# Patient Record
Sex: Male | Born: 1955
Health system: Southern US, Community
[De-identification: ages and names within clinical notes are randomized; demographics above are authoritative.]

## PROBLEM LIST (undated history)

## (undated) DIAGNOSIS — C3410 Malignant neoplasm of upper lobe, unspecified bronchus or lung: Secondary | ICD-10-CM

## (undated) DIAGNOSIS — C801 Malignant (primary) neoplasm, unspecified: Secondary | ICD-10-CM

## (undated) DIAGNOSIS — C3490 Malignant neoplasm of unspecified part of unspecified bronchus or lung: Secondary | ICD-10-CM

## (undated) HISTORY — DX: Malignant (primary) neoplasm, unspecified: C80.1

## (undated) HISTORY — DX: Malignant neoplasm of upper lobe, unspecified bronchus or lung: C34.10

---

## 2003-09-28 ENCOUNTER — Other Ambulatory Visit: Payer: Self-pay

## 2007-02-20 ENCOUNTER — Other Ambulatory Visit: Payer: Self-pay

## 2007-02-20 ENCOUNTER — Emergency Department: Payer: Self-pay | Admitting: Internal Medicine

## 2007-02-21 ENCOUNTER — Emergency Department: Payer: Self-pay | Admitting: Emergency Medicine

## 2009-03-18 DIAGNOSIS — C801 Malignant (primary) neoplasm, unspecified: Secondary | ICD-10-CM

## 2009-03-18 HISTORY — DX: Malignant (primary) neoplasm, unspecified: C80.1

## 2009-04-27 ENCOUNTER — Emergency Department: Payer: Self-pay | Admitting: Emergency Medicine

## 2009-05-11 ENCOUNTER — Inpatient Hospital Stay: Payer: Self-pay | Admitting: Specialist

## 2009-07-04 ENCOUNTER — Inpatient Hospital Stay: Payer: Self-pay | Admitting: Internal Medicine

## 2010-01-29 ENCOUNTER — Ambulatory Visit: Payer: Self-pay | Admitting: Family

## 2010-02-05 ENCOUNTER — Ambulatory Visit: Payer: Self-pay | Admitting: Internal Medicine

## 2010-02-15 DIAGNOSIS — C119 Malignant neoplasm of nasopharynx, unspecified: Secondary | ICD-10-CM | POA: Insufficient documentation

## 2010-02-21 ENCOUNTER — Ambulatory Visit: Payer: Self-pay | Admitting: Unknown Physician Specialty

## 2010-02-26 ENCOUNTER — Ambulatory Visit: Payer: Self-pay | Admitting: Oncology

## 2010-03-02 ENCOUNTER — Ambulatory Visit: Payer: Self-pay | Admitting: Surgery

## 2010-03-18 ENCOUNTER — Ambulatory Visit: Payer: Self-pay | Admitting: Oncology

## 2010-04-18 ENCOUNTER — Ambulatory Visit: Payer: Self-pay | Admitting: Oncology

## 2010-05-17 ENCOUNTER — Ambulatory Visit: Payer: Self-pay | Admitting: Oncology

## 2010-06-17 ENCOUNTER — Ambulatory Visit: Payer: Self-pay | Admitting: Oncology

## 2010-07-17 ENCOUNTER — Ambulatory Visit: Payer: Self-pay | Admitting: Oncology

## 2010-08-17 ENCOUNTER — Ambulatory Visit: Payer: Self-pay | Admitting: Oncology

## 2010-09-16 ENCOUNTER — Ambulatory Visit: Payer: Self-pay | Admitting: Oncology

## 2010-10-18 ENCOUNTER — Ambulatory Visit: Payer: Self-pay | Admitting: Oncology

## 2010-10-30 ENCOUNTER — Ambulatory Visit: Payer: Self-pay | Admitting: Oncology

## 2010-11-17 ENCOUNTER — Ambulatory Visit: Payer: Self-pay | Admitting: Oncology

## 2010-12-17 ENCOUNTER — Ambulatory Visit: Payer: Self-pay | Admitting: Oncology

## 2011-01-31 ENCOUNTER — Ambulatory Visit: Payer: Self-pay | Admitting: Oncology

## 2011-02-16 ENCOUNTER — Ambulatory Visit: Payer: Self-pay | Admitting: Oncology

## 2011-05-03 ENCOUNTER — Ambulatory Visit: Payer: Self-pay | Admitting: Oncology

## 2011-05-17 ENCOUNTER — Ambulatory Visit: Payer: Self-pay | Admitting: Oncology

## 2011-06-11 ENCOUNTER — Ambulatory Visit: Payer: Self-pay | Admitting: Oncology

## 2011-06-13 LAB — CBC CANCER CENTER
Basophil #: 0 x10 3/mm (ref 0.0–0.1)
Eosinophil #: 0.1 x10 3/mm (ref 0.0–0.7)
Eosinophil %: 1.3 %
Lymphocyte #: 1.2 x10 3/mm (ref 1.0–3.6)
Lymphocyte %: 18.3 %
MCHC: 34 g/dL (ref 32.0–36.0)
Monocyte #: 0.5 x10 3/mm (ref 0.0–0.7)
Monocyte %: 7.8 %
Neutrophil #: 4.6 x10 3/mm (ref 1.4–6.5)
Neutrophil %: 71.8 %
Platelet: 247 x10 3/mm (ref 150–440)
RBC: 5.05 10*6/uL (ref 4.40–5.90)
RDW: 14.4 % (ref 11.5–14.5)
WBC: 6.4 x10 3/mm (ref 3.8–10.6)

## 2011-06-13 LAB — BASIC METABOLIC PANEL
Anion Gap: 9 (ref 7–16)
Co2: 28 mmol/L (ref 21–32)
EGFR (Non-African Amer.): >60

## 2011-06-17 ENCOUNTER — Ambulatory Visit: Payer: Self-pay | Admitting: Oncology

## 2011-09-16 ENCOUNTER — Ambulatory Visit: Payer: Self-pay | Admitting: Oncology

## 2011-09-16 LAB — CBC CANCER CENTER
Basophil #: 0 x10 3/mm (ref 0.0–0.1)
Eosinophil #: 0.1 x10 3/mm (ref 0.0–0.7)
Eosinophil %: 1.4 %
HCT: 47.1 % (ref 40.0–52.0)
HGB: 16 g/dL (ref 13.0–18.0)
Lymphocyte #: 1.3 x10 3/mm (ref 1.0–3.6)
MCH: 31.4 pg (ref 26.0–34.0)
MCV: 92 fL (ref 80–100)
Monocyte %: 9.6 %
Neutrophil #: 5.5 x10 3/mm (ref 1.4–6.5)
RBC: 5.1 10*6/uL (ref 4.40–5.90)
RDW: 13.8 % (ref 11.5–14.5)
WBC: 7.6 x10 3/mm (ref 3.8–10.6)

## 2011-09-16 LAB — TSH: Thyroid Stimulating Horm: 1.95 u[IU]/mL

## 2011-09-16 LAB — COMPREHENSIVE METABOLIC PANEL
Albumin: 4.1 g/dL (ref 3.4–5.0)
Alkaline Phosphatase: 82 U/L (ref 50–136)
Anion Gap: 6 — ABNORMAL LOW (ref 7–16)
BUN: 11 mg/dL (ref 7–18)
Bilirubin,Total: 0.4 mg/dL (ref 0.2–1.0)
Calcium, Total: 8.9 mg/dL (ref 8.5–10.1)
Chloride: 104 mmol/L (ref 98–107)
Creatinine: 0.88 mg/dL (ref 0.60–1.30)
Glucose: 94 mg/dL (ref 65–99)
Osmolality: 280 (ref 275–301)
Sodium: 141 mmol/L (ref 136–145)
Total Protein: 7.3 g/dL (ref 6.4–8.2)

## 2011-10-17 ENCOUNTER — Ambulatory Visit: Payer: Self-pay | Admitting: Oncology

## 2011-12-04 ENCOUNTER — Ambulatory Visit: Payer: Self-pay | Admitting: Oncology

## 2011-12-04 DIAGNOSIS — Z452 Encounter for adjustment and management of vascular access device: Secondary | ICD-10-CM | POA: Diagnosis not present

## 2011-12-04 DIAGNOSIS — C119 Malignant neoplasm of nasopharynx, unspecified: Secondary | ICD-10-CM | POA: Diagnosis not present

## 2011-12-04 DIAGNOSIS — C770 Secondary and unspecified malignant neoplasm of lymph nodes of head, face and neck: Secondary | ICD-10-CM | POA: Diagnosis not present

## 2011-12-17 ENCOUNTER — Ambulatory Visit: Payer: Self-pay | Admitting: Oncology

## 2012-01-22 ENCOUNTER — Ambulatory Visit: Payer: Self-pay | Admitting: Oncology

## 2012-01-22 DIAGNOSIS — C770 Secondary and unspecified malignant neoplasm of lymph nodes of head, face and neck: Secondary | ICD-10-CM | POA: Diagnosis not present

## 2012-01-22 DIAGNOSIS — Z452 Encounter for adjustment and management of vascular access device: Secondary | ICD-10-CM | POA: Diagnosis not present

## 2012-01-22 DIAGNOSIS — C119 Malignant neoplasm of nasopharynx, unspecified: Secondary | ICD-10-CM | POA: Diagnosis not present

## 2012-02-16 ENCOUNTER — Ambulatory Visit: Payer: Self-pay | Admitting: Oncology

## 2012-02-20 DIAGNOSIS — C119 Malignant neoplasm of nasopharynx, unspecified: Secondary | ICD-10-CM | POA: Diagnosis not present

## 2012-02-20 DIAGNOSIS — T65291A Toxic effect of other tobacco and nicotine, accidental (unintentional), initial encounter: Secondary | ICD-10-CM | POA: Diagnosis not present

## 2012-03-23 ENCOUNTER — Ambulatory Visit: Payer: Self-pay | Admitting: Oncology

## 2012-03-23 DIAGNOSIS — Z923 Personal history of irradiation: Secondary | ICD-10-CM | POA: Diagnosis not present

## 2012-03-23 DIAGNOSIS — Z9221 Personal history of antineoplastic chemotherapy: Secondary | ICD-10-CM | POA: Diagnosis not present

## 2012-03-23 DIAGNOSIS — T82898A Other specified complication of vascular prosthetic devices, implants and grafts, initial encounter: Secondary | ICD-10-CM | POA: Diagnosis not present

## 2012-03-23 DIAGNOSIS — Z85819 Personal history of malignant neoplasm of unspecified site of lip, oral cavity, and pharynx: Secondary | ICD-10-CM | POA: Diagnosis not present

## 2012-03-23 LAB — COMPREHENSIVE METABOLIC PANEL
Albumin: 4.3 g/dL (ref 3.4–5.0)
Alkaline Phosphatase: 83 U/L (ref 50–136)
Anion Gap: 9 (ref 7–16)
BUN: 22 mg/dL — ABNORMAL HIGH (ref 7–18)
Bilirubin,Total: 0.4 mg/dL (ref 0.2–1.0)
Co2: 29 mmol/L (ref 21–32)
Creatinine: 1.03 mg/dL (ref 0.60–1.30)
Glucose: 98 mg/dL (ref 65–99)
Potassium: 4.8 mmol/L (ref 3.5–5.1)
SGOT(AST): 17 U/L (ref 15–37)
SGPT (ALT): 30 U/L (ref 12–78)
Sodium: 142 mmol/L (ref 136–145)

## 2012-03-23 LAB — CBC CANCER CENTER
Basophil #: 0 x10 3/mm (ref 0.0–0.1)
Basophil %: 0.6 %
Eosinophil #: 0.1 x10 3/mm (ref 0.0–0.7)
Eosinophil %: 2.1 %
HGB: 15.9 g/dL (ref 13.0–18.0)
Lymphocyte %: 19.3 %
MCH: 31 pg (ref 26.0–34.0)
MCHC: 34.3 g/dL (ref 32.0–36.0)
Monocyte #: 0.6 x10 3/mm (ref 0.2–1.0)
Monocyte %: 8.5 %
Neutrophil %: 69.5 %
Platelet: 239 x10 3/mm (ref 150–440)
RDW: 14.3 % (ref 11.5–14.5)
WBC: 6.9 x10 3/mm (ref 3.8–10.6)

## 2012-04-18 ENCOUNTER — Ambulatory Visit: Payer: Self-pay | Admitting: Oncology

## 2012-05-16 ENCOUNTER — Ambulatory Visit: Payer: Self-pay | Admitting: Oncology

## 2012-05-16 DIAGNOSIS — Z85819 Personal history of malignant neoplasm of unspecified site of lip, oral cavity, and pharynx: Secondary | ICD-10-CM | POA: Diagnosis not present

## 2012-05-16 DIAGNOSIS — Z79899 Other long term (current) drug therapy: Secondary | ICD-10-CM | POA: Diagnosis not present

## 2012-05-16 DIAGNOSIS — F411 Generalized anxiety disorder: Secondary | ICD-10-CM | POA: Diagnosis not present

## 2012-05-16 DIAGNOSIS — Z923 Personal history of irradiation: Secondary | ICD-10-CM | POA: Diagnosis not present

## 2012-05-16 DIAGNOSIS — G4700 Insomnia, unspecified: Secondary | ICD-10-CM | POA: Diagnosis not present

## 2012-05-16 DIAGNOSIS — IMO0002 Reserved for concepts with insufficient information to code with codable children: Secondary | ICD-10-CM | POA: Diagnosis not present

## 2012-05-16 DIAGNOSIS — J449 Chronic obstructive pulmonary disease, unspecified: Secondary | ICD-10-CM | POA: Diagnosis not present

## 2012-05-16 DIAGNOSIS — F17200 Nicotine dependence, unspecified, uncomplicated: Secondary | ICD-10-CM | POA: Diagnosis not present

## 2012-05-16 DIAGNOSIS — Z9221 Personal history of antineoplastic chemotherapy: Secondary | ICD-10-CM | POA: Diagnosis not present

## 2012-06-15 DIAGNOSIS — G4700 Insomnia, unspecified: Secondary | ICD-10-CM | POA: Diagnosis not present

## 2012-06-15 DIAGNOSIS — Z923 Personal history of irradiation: Secondary | ICD-10-CM | POA: Diagnosis not present

## 2012-06-15 DIAGNOSIS — Z9221 Personal history of antineoplastic chemotherapy: Secondary | ICD-10-CM | POA: Diagnosis not present

## 2012-06-15 DIAGNOSIS — Z85819 Personal history of malignant neoplasm of unspecified site of lip, oral cavity, and pharynx: Secondary | ICD-10-CM | POA: Diagnosis not present

## 2012-06-15 DIAGNOSIS — C760 Malignant neoplasm of head, face and neck: Secondary | ICD-10-CM | POA: Diagnosis not present

## 2012-06-15 LAB — CBC CANCER CENTER
Basophil #: 0.1 x10 3/mm (ref 0.0–0.1)
Basophil %: 0.8 %
Eosinophil #: 0.1 x10 3/mm (ref 0.0–0.7)
HCT: 47.7 % (ref 40.0–52.0)
HGB: 16.5 g/dL (ref 13.0–18.0)
Lymphocyte #: 1.3 x10 3/mm (ref 1.0–3.6)
Lymphocyte %: 15.4 %
MCH: 30.8 pg (ref 26.0–34.0)
MCV: 89 fL (ref 80–100)
Monocyte #: 0.6 x10 3/mm (ref 0.2–1.0)
Monocyte %: 6.9 %
Neutrophil #: 6.3 x10 3/mm (ref 1.4–6.5)
RBC: 5.35 10*6/uL (ref 4.40–5.90)
RDW: 14.6 % — ABNORMAL HIGH (ref 11.5–14.5)
WBC: 8.3 x10 3/mm (ref 3.8–10.6)

## 2012-06-15 LAB — COMPREHENSIVE METABOLIC PANEL
Albumin: 4.1 g/dL (ref 3.4–5.0)
Alkaline Phosphatase: 84 U/L (ref 50–136)
BUN: 13 mg/dL (ref 7–18)
Bilirubin,Total: 0.4 mg/dL (ref 0.2–1.0)
Chloride: 103 mmol/L (ref 98–107)
Creatinine: 0.94 mg/dL (ref 0.60–1.30)
EGFR (African American): >60
EGFR (Non-African Amer.): >60
Glucose: 102 mg/dL — ABNORMAL HIGH (ref 65–99)
Osmolality: 283 (ref 275–301)
Potassium: 4.6 mmol/L (ref 3.5–5.1)
SGOT(AST): 13 U/L — ABNORMAL LOW (ref 15–37)
SGPT (ALT): 22 U/L (ref 12–78)
Sodium: 142 mmol/L (ref 136–145)

## 2012-06-15 LAB — TSH: Thyroid Stimulating Horm: 2.07 u[IU]/mL

## 2012-06-16 ENCOUNTER — Ambulatory Visit: Payer: Self-pay | Admitting: Oncology

## 2012-08-20 DIAGNOSIS — T65291A Toxic effect of other tobacco and nicotine, accidental (unintentional), initial encounter: Secondary | ICD-10-CM | POA: Diagnosis not present

## 2012-08-20 DIAGNOSIS — D0000 Carcinoma in situ of oral cavity, unspecified site: Secondary | ICD-10-CM | POA: Diagnosis not present

## 2012-08-20 DIAGNOSIS — C119 Malignant neoplasm of nasopharynx, unspecified: Secondary | ICD-10-CM | POA: Diagnosis not present

## 2012-08-20 DIAGNOSIS — J438 Other emphysema: Secondary | ICD-10-CM | POA: Diagnosis not present

## 2012-10-27 DIAGNOSIS — R279 Unspecified lack of coordination: Secondary | ICD-10-CM | POA: Diagnosis not present

## 2012-10-27 DIAGNOSIS — C760 Malignant neoplasm of head, face and neck: Secondary | ICD-10-CM | POA: Diagnosis not present

## 2012-10-27 DIAGNOSIS — R5381 Other malaise: Secondary | ICD-10-CM | POA: Diagnosis not present

## 2012-10-27 DIAGNOSIS — D0000 Carcinoma in situ of oral cavity, unspecified site: Secondary | ICD-10-CM | POA: Diagnosis not present

## 2012-12-02 ENCOUNTER — Ambulatory Visit: Payer: Self-pay | Admitting: Oncology

## 2012-12-02 DIAGNOSIS — Z85819 Personal history of malignant neoplasm of unspecified site of lip, oral cavity, and pharynx: Secondary | ICD-10-CM | POA: Diagnosis not present

## 2012-12-02 DIAGNOSIS — Z79899 Other long term (current) drug therapy: Secondary | ICD-10-CM | POA: Diagnosis not present

## 2012-12-02 DIAGNOSIS — Z923 Personal history of irradiation: Secondary | ICD-10-CM | POA: Diagnosis not present

## 2012-12-02 DIAGNOSIS — F329 Major depressive disorder, single episode, unspecified: Secondary | ICD-10-CM | POA: Diagnosis not present

## 2012-12-02 DIAGNOSIS — Z9221 Personal history of antineoplastic chemotherapy: Secondary | ICD-10-CM | POA: Diagnosis not present

## 2012-12-02 DIAGNOSIS — Z87898 Personal history of other specified conditions: Secondary | ICD-10-CM | POA: Diagnosis not present

## 2012-12-03 DIAGNOSIS — Z79899 Other long term (current) drug therapy: Secondary | ICD-10-CM | POA: Diagnosis not present

## 2012-12-03 DIAGNOSIS — Z923 Personal history of irradiation: Secondary | ICD-10-CM | POA: Diagnosis not present

## 2012-12-03 DIAGNOSIS — R05 Cough: Secondary | ICD-10-CM | POA: Diagnosis not present

## 2012-12-03 DIAGNOSIS — Z85819 Personal history of malignant neoplasm of unspecified site of lip, oral cavity, and pharynx: Secondary | ICD-10-CM | POA: Diagnosis not present

## 2012-12-03 DIAGNOSIS — Z09 Encounter for follow-up examination after completed treatment for conditions other than malignant neoplasm: Secondary | ICD-10-CM | POA: Diagnosis not present

## 2012-12-03 DIAGNOSIS — F329 Major depressive disorder, single episode, unspecified: Secondary | ICD-10-CM | POA: Diagnosis not present

## 2012-12-03 DIAGNOSIS — Z9221 Personal history of antineoplastic chemotherapy: Secondary | ICD-10-CM | POA: Diagnosis not present

## 2012-12-03 DIAGNOSIS — R0602 Shortness of breath: Secondary | ICD-10-CM | POA: Diagnosis not present

## 2012-12-03 DIAGNOSIS — Z87898 Personal history of other specified conditions: Secondary | ICD-10-CM | POA: Diagnosis not present

## 2012-12-03 LAB — COMPREHENSIVE METABOLIC PANEL
Anion Gap: 1 — ABNORMAL LOW (ref 7–16)
BUN: 20 mg/dL — ABNORMAL HIGH (ref 7–18)
Bilirubin,Total: 0.3 mg/dL (ref 0.2–1.0)
Chloride: 108 mmol/L — ABNORMAL HIGH (ref 98–107)
Co2: 31 mmol/L (ref 21–32)
EGFR (African American): >60
EGFR (Non-African Amer.): >60
Glucose: 74 mg/dL (ref 65–99)
Osmolality: 281 (ref 275–301)
Potassium: 4.6 mmol/L (ref 3.5–5.1)
SGOT(AST): 13 U/L — ABNORMAL LOW (ref 15–37)
SGPT (ALT): 30 U/L (ref 12–78)

## 2012-12-03 LAB — T4, FREE: Free Thyroxine: 0.98 ng/dL (ref 0.76–1.46)

## 2012-12-03 LAB — TSH: Thyroid Stimulating Horm: 1.85 u[IU]/mL

## 2012-12-16 ENCOUNTER — Ambulatory Visit: Payer: Self-pay | Admitting: Oncology

## 2013-01-06 DIAGNOSIS — R279 Unspecified lack of coordination: Secondary | ICD-10-CM | POA: Diagnosis not present

## 2013-01-06 DIAGNOSIS — Z1339 Encounter for screening examination for other mental health and behavioral disorders: Secondary | ICD-10-CM | POA: Diagnosis not present

## 2013-01-06 DIAGNOSIS — R5381 Other malaise: Secondary | ICD-10-CM | POA: Diagnosis not present

## 2013-01-06 DIAGNOSIS — J209 Acute bronchitis, unspecified: Secondary | ICD-10-CM | POA: Diagnosis not present

## 2013-01-19 DIAGNOSIS — J438 Other emphysema: Secondary | ICD-10-CM | POA: Diagnosis not present

## 2013-01-19 DIAGNOSIS — C760 Malignant neoplasm of head, face and neck: Secondary | ICD-10-CM | POA: Diagnosis not present

## 2013-01-19 DIAGNOSIS — D0000 Carcinoma in situ of oral cavity, unspecified site: Secondary | ICD-10-CM | POA: Diagnosis not present

## 2013-03-18 DIAGNOSIS — C3490 Malignant neoplasm of unspecified part of unspecified bronchus or lung: Secondary | ICD-10-CM

## 2013-03-18 HISTORY — DX: Malignant neoplasm of unspecified part of unspecified bronchus or lung: C34.90

## 2013-06-01 ENCOUNTER — Ambulatory Visit: Payer: Self-pay | Admitting: Oncology

## 2013-06-01 DIAGNOSIS — F17200 Nicotine dependence, unspecified, uncomplicated: Secondary | ICD-10-CM | POA: Diagnosis not present

## 2013-06-01 DIAGNOSIS — K7689 Other specified diseases of liver: Secondary | ICD-10-CM | POA: Diagnosis not present

## 2013-06-01 DIAGNOSIS — F411 Generalized anxiety disorder: Secondary | ICD-10-CM | POA: Diagnosis not present

## 2013-06-01 DIAGNOSIS — Z79899 Other long term (current) drug therapy: Secondary | ICD-10-CM | POA: Diagnosis not present

## 2013-06-01 DIAGNOSIS — D3500 Benign neoplasm of unspecified adrenal gland: Secondary | ICD-10-CM | POA: Diagnosis not present

## 2013-06-01 DIAGNOSIS — J440 Chronic obstructive pulmonary disease with acute lower respiratory infection: Secondary | ICD-10-CM | POA: Diagnosis not present

## 2013-06-01 DIAGNOSIS — Z85819 Personal history of malignant neoplasm of unspecified site of lip, oral cavity, and pharynx: Secondary | ICD-10-CM | POA: Diagnosis not present

## 2013-06-01 DIAGNOSIS — Z923 Personal history of irradiation: Secondary | ICD-10-CM | POA: Diagnosis not present

## 2013-06-01 DIAGNOSIS — F329 Major depressive disorder, single episode, unspecified: Secondary | ICD-10-CM | POA: Diagnosis not present

## 2013-06-01 DIAGNOSIS — F3289 Other specified depressive episodes: Secondary | ICD-10-CM | POA: Diagnosis not present

## 2013-06-01 DIAGNOSIS — Z9221 Personal history of antineoplastic chemotherapy: Secondary | ICD-10-CM | POA: Diagnosis not present

## 2013-06-01 DIAGNOSIS — R911 Solitary pulmonary nodule: Secondary | ICD-10-CM | POA: Diagnosis not present

## 2013-06-01 DIAGNOSIS — R599 Enlarged lymph nodes, unspecified: Secondary | ICD-10-CM | POA: Diagnosis not present

## 2013-06-01 DIAGNOSIS — Z09 Encounter for follow-up examination after completed treatment for conditions other than malignant neoplasm: Secondary | ICD-10-CM | POA: Diagnosis not present

## 2013-06-01 DIAGNOSIS — D72829 Elevated white blood cell count, unspecified: Secondary | ICD-10-CM | POA: Diagnosis not present

## 2013-06-01 DIAGNOSIS — R5383 Other fatigue: Secondary | ICD-10-CM | POA: Diagnosis not present

## 2013-06-01 DIAGNOSIS — H9190 Unspecified hearing loss, unspecified ear: Secondary | ICD-10-CM | POA: Diagnosis not present

## 2013-06-01 DIAGNOSIS — R5381 Other malaise: Secondary | ICD-10-CM | POA: Diagnosis not present

## 2013-06-01 DIAGNOSIS — J449 Chronic obstructive pulmonary disease, unspecified: Secondary | ICD-10-CM | POA: Diagnosis not present

## 2013-06-01 DIAGNOSIS — IMO0002 Reserved for concepts with insufficient information to code with codable children: Secondary | ICD-10-CM | POA: Diagnosis not present

## 2013-06-02 DIAGNOSIS — Z85819 Personal history of malignant neoplasm of unspecified site of lip, oral cavity, and pharynx: Secondary | ICD-10-CM | POA: Diagnosis not present

## 2013-06-02 DIAGNOSIS — Z923 Personal history of irradiation: Secondary | ICD-10-CM | POA: Diagnosis not present

## 2013-06-02 DIAGNOSIS — J440 Chronic obstructive pulmonary disease with acute lower respiratory infection: Secondary | ICD-10-CM | POA: Diagnosis not present

## 2013-06-02 DIAGNOSIS — J40 Bronchitis, not specified as acute or chronic: Secondary | ICD-10-CM | POA: Diagnosis not present

## 2013-06-02 DIAGNOSIS — Z09 Encounter for follow-up examination after completed treatment for conditions other than malignant neoplasm: Secondary | ICD-10-CM | POA: Diagnosis not present

## 2013-06-02 DIAGNOSIS — Z9221 Personal history of antineoplastic chemotherapy: Secondary | ICD-10-CM | POA: Diagnosis not present

## 2013-06-02 LAB — CBC CANCER CENTER
BASOS ABS: 0.1 x10 3/mm (ref 0.0–0.1)
Basophil %: 0.4 %
Eosinophil #: 0.2 x10 3/mm (ref 0.0–0.7)
Eosinophil %: 1.2 %
HCT: 45.6 % (ref 40.0–52.0)
HGB: 14.9 g/dL (ref 13.0–18.0)
Lymphocyte #: 0.8 x10 3/mm — ABNORMAL LOW (ref 1.0–3.6)
Lymphocyte %: 5.5 %
MCH: 29.5 pg (ref 26.0–34.0)
MCHC: 32.8 g/dL (ref 32.0–36.0)
MCV: 90 fL (ref 80–100)
MONOS PCT: 8.3 %
Monocyte #: 1.3 x10 3/mm — ABNORMAL HIGH (ref 0.2–1.0)
NEUTROS PCT: 84.6 %
Neutrophil #: 13 x10 3/mm — ABNORMAL HIGH (ref 1.4–6.5)
Platelet: 407 x10 3/mm (ref 150–440)
RBC: 5.07 10*6/uL (ref 4.40–5.90)
RDW: 14.2 % (ref 11.5–14.5)
WBC: 15.4 x10 3/mm — ABNORMAL HIGH (ref 3.8–10.6)

## 2013-06-02 LAB — COMPREHENSIVE METABOLIC PANEL
ALK PHOS: 159 U/L — AB
ALT: 51 U/L (ref 12–78)
Albumin: 3.3 g/dL — ABNORMAL LOW (ref 3.4–5.0)
Anion Gap: 7 (ref 7–16)
BUN: 25 mg/dL — ABNORMAL HIGH (ref 7–18)
Bilirubin,Total: 0.6 mg/dL (ref 0.2–1.0)
Calcium, Total: 9.3 mg/dL (ref 8.5–10.1)
Chloride: 98 mmol/L (ref 98–107)
Co2: 33 mmol/L — ABNORMAL HIGH (ref 21–32)
Creatinine: 1.02 mg/dL (ref 0.60–1.30)
Glucose: 158 mg/dL — ABNORMAL HIGH (ref 65–99)
OSMOLALITY: 283 (ref 275–301)
Potassium: 4 mmol/L (ref 3.5–5.1)
SGOT(AST): 23 U/L (ref 15–37)
Sodium: 138 mmol/L (ref 136–145)
Total Protein: 7.8 g/dL (ref 6.4–8.2)

## 2013-06-08 DIAGNOSIS — D3500 Benign neoplasm of unspecified adrenal gland: Secondary | ICD-10-CM | POA: Diagnosis not present

## 2013-06-08 DIAGNOSIS — R599 Enlarged lymph nodes, unspecified: Secondary | ICD-10-CM | POA: Diagnosis not present

## 2013-06-08 DIAGNOSIS — R918 Other nonspecific abnormal finding of lung field: Secondary | ICD-10-CM | POA: Diagnosis not present

## 2013-06-10 DIAGNOSIS — Z9221 Personal history of antineoplastic chemotherapy: Secondary | ICD-10-CM | POA: Diagnosis not present

## 2013-06-10 DIAGNOSIS — Z923 Personal history of irradiation: Secondary | ICD-10-CM | POA: Diagnosis not present

## 2013-06-10 DIAGNOSIS — R599 Enlarged lymph nodes, unspecified: Secondary | ICD-10-CM | POA: Diagnosis not present

## 2013-06-10 DIAGNOSIS — Z85819 Personal history of malignant neoplasm of unspecified site of lip, oral cavity, and pharynx: Secondary | ICD-10-CM | POA: Diagnosis not present

## 2013-06-10 LAB — COMPREHENSIVE METABOLIC PANEL
ALBUMIN: 3.4 g/dL (ref 3.4–5.0)
ALK PHOS: 89 U/L
ALT: 40 U/L (ref 12–78)
Anion Gap: 7 (ref 7–16)
BUN: 24 mg/dL — AB (ref 7–18)
Bilirubin,Total: 0.3 mg/dL (ref 0.2–1.0)
CALCIUM: 9.3 mg/dL (ref 8.5–10.1)
Chloride: 101 mmol/L (ref 98–107)
Co2: 33 mmol/L — ABNORMAL HIGH (ref 21–32)
Creatinine: 1.14 mg/dL (ref 0.60–1.30)
EGFR (Non-African Amer.): >60
GLUCOSE: 102 mg/dL — AB (ref 65–99)
OSMOLALITY: 285 (ref 275–301)
Potassium: 4.9 mmol/L (ref 3.5–5.1)
SGOT(AST): 15 U/L (ref 15–37)
Sodium: 141 mmol/L (ref 136–145)
Total Protein: 7.1 g/dL (ref 6.4–8.2)

## 2013-06-10 LAB — PROTIME-INR
INR: 1
Prothrombin Time: 13.1 secs (ref 11.5–14.7)

## 2013-06-10 LAB — CBC CANCER CENTER
BASOS PCT: 0.5 %
Basophil #: 0.1 x10 3/mm (ref 0.0–0.1)
Eosinophil #: 0.2 x10 3/mm (ref 0.0–0.7)
Eosinophil %: 1.6 %
HCT: 49.9 % (ref 40.0–52.0)
HGB: 16.2 g/dL (ref 13.0–18.0)
LYMPHS PCT: 14.4 %
Lymphocyte #: 2 x10 3/mm (ref 1.0–3.6)
MCH: 29.2 pg (ref 26.0–34.0)
MCHC: 32.5 g/dL (ref 32.0–36.0)
MCV: 90 fL (ref 80–100)
MONO ABS: 1 x10 3/mm (ref 0.2–1.0)
Monocyte %: 6.7 %
Neutrophil #: 10.8 x10 3/mm — ABNORMAL HIGH (ref 1.4–6.5)
Neutrophil %: 76.8 %
Platelet: 515 x10 3/mm — ABNORMAL HIGH (ref 150–440)
RBC: 5.55 10*6/uL (ref 4.40–5.90)
RDW: 14.5 % (ref 11.5–14.5)
WBC: 14.1 x10 3/mm — ABNORMAL HIGH (ref 3.8–10.6)

## 2013-06-10 LAB — APTT: Activated PTT: 27.9 secs (ref 23.6–35.9)

## 2013-06-16 ENCOUNTER — Ambulatory Visit: Payer: Self-pay | Admitting: Oncology

## 2013-06-18 ENCOUNTER — Ambulatory Visit: Payer: Self-pay | Admitting: Internal Medicine

## 2013-06-18 DIAGNOSIS — R222 Localized swelling, mass and lump, trunk: Secondary | ICD-10-CM | POA: Diagnosis not present

## 2013-06-18 DIAGNOSIS — R599 Enlarged lymph nodes, unspecified: Secondary | ICD-10-CM | POA: Diagnosis not present

## 2013-06-19 ENCOUNTER — Ambulatory Visit: Payer: Self-pay | Admitting: Oncology

## 2013-06-23 LAB — PATHOLOGY REPORT

## 2013-06-28 DIAGNOSIS — C3490 Malignant neoplasm of unspecified part of unspecified bronchus or lung: Secondary | ICD-10-CM | POA: Diagnosis not present

## 2013-06-28 DIAGNOSIS — R599 Enlarged lymph nodes, unspecified: Secondary | ICD-10-CM | POA: Diagnosis not present

## 2013-06-28 DIAGNOSIS — Z85819 Personal history of malignant neoplasm of unspecified site of lip, oral cavity, and pharynx: Secondary | ICD-10-CM | POA: Diagnosis not present

## 2013-06-30 ENCOUNTER — Ambulatory Visit (INDEPENDENT_AMBULATORY_CARE_PROVIDER_SITE_OTHER): Payer: Medicare Other | Admitting: General Surgery

## 2013-06-30 ENCOUNTER — Encounter: Payer: Self-pay | Admitting: General Surgery

## 2013-06-30 VITALS — BP 116/70 | HR 76 | Resp 12 | Ht 66.0 in | Wt 166.0 lb

## 2013-06-30 DIAGNOSIS — C771 Secondary and unspecified malignant neoplasm of intrathoracic lymph nodes: Secondary | ICD-10-CM | POA: Diagnosis not present

## 2013-06-30 NOTE — Progress Notes (Signed)
Patient ID: Jason Bryan, male   DOB: 14-Oct-1955, 58 y.o.   MRN: [id number]  Chief Complaint  Patient presents with  . Other    port placement    HPI Jason Bryan is a 58 y.o. male here today for a evaluation port placement. Ref. Dr. Oliva Bustard. 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 He was originally diagnosed in 2011 but within the last 2-3 weeks was diagnosed with  recurrence of cancer in the mediastinal nodes.. He has had radiation and chemo back in 2011.   HPI  Past Medical History  Diagnosis Date  . Cancer 2011    squemous stage 3    History reviewed. No pertinent past surgical history.  Family History  Problem Relation Age of Onset  . Cancer Mother     Social History History  Substance Use Topics  . Smoking status: Former Smoker -- 1.00 packs/day for 40 years    Types: Cigarettes  . Smokeless tobacco: Never Used  . Alcohol Use: No    No Known Allergies  Current Outpatient Prescriptions  Medication Sig Dispense Refill  . budesonide-formoterol (SYMBICORT) 160-4.5 MCG/ACT inhaler Inhale 1 puff into the lungs daily.      . temazepam (RESTORIL) 30 MG capsule Take 1 capsule by mouth daily.      . traZODone (DESYREL) 50 MG tablet Take 1 tablet by mouth as needed.       No current facility-administered medications for this visit.    Review of Systems Review of Systems  Constitutional: Negative.   Respiratory: Negative.   Cardiovascular: Negative.     Blood pressure 116/70, pulse 76, resp. rate 12, height 5\' 6"  (1.676 m), weight 166 lb (75.297 kg).  Physical Exam Physical Exam  Constitutional: He is oriented to person, place, and time. He appears well-developed and well-nourished.  Eyes: Conjunctivae are normal.  Neck: Neck supple.  Cardiovascular: Normal rate, regular rhythm and normal heart sounds.   Pulmonary/Chest: Breath sounds normal.  Neurological: He is alert and oriented to person, place, and time.   Skin: Skin is warm and dry.    Data Reviewed Oncology notes  Assessment      Pt needs port for chemo.   Plan    Patien agreeable to port. He had one in 2011 which was removed a couple of yrs ago.     Patient is scheduled for surgery at [facility] for 07/05/13. He will pre admit by phone on 07/01/13. Patient is aware of date and instructions.   Seeplaputhur G Sankar 06/30/2013, 3:38 PM

## 2013-06-30 NOTE — Patient Instructions (Addendum)
Patient to have a port placement  Patient is scheduled for surgery at [facility] for 07/05/13. He will pre admit by phone on 07/01/13. Patient is aware of date and instructions.

## 2013-07-05 ENCOUNTER — Ambulatory Visit: Payer: Self-pay | Admitting: General Surgery

## 2013-07-05 ENCOUNTER — Encounter: Payer: Self-pay | Admitting: General Surgery

## 2013-07-05 DIAGNOSIS — Z87891 Personal history of nicotine dependence: Secondary | ICD-10-CM | POA: Diagnosis not present

## 2013-07-05 DIAGNOSIS — R599 Enlarged lymph nodes, unspecified: Secondary | ICD-10-CM | POA: Diagnosis not present

## 2013-07-05 DIAGNOSIS — Z452 Encounter for adjustment and management of vascular access device: Secondary | ICD-10-CM | POA: Diagnosis not present

## 2013-07-05 DIAGNOSIS — J4489 Other specified chronic obstructive pulmonary disease: Secondary | ICD-10-CM | POA: Diagnosis not present

## 2013-07-05 DIAGNOSIS — J9819 Other pulmonary collapse: Secondary | ICD-10-CM | POA: Diagnosis not present

## 2013-07-05 DIAGNOSIS — Z79899 Other long term (current) drug therapy: Secondary | ICD-10-CM | POA: Diagnosis not present

## 2013-07-05 DIAGNOSIS — C969 Malignant neoplasm of lymphoid, hematopoietic and related tissue, unspecified: Secondary | ICD-10-CM | POA: Diagnosis not present

## 2013-07-05 DIAGNOSIS — C771 Secondary and unspecified malignant neoplasm of intrathoracic lymph nodes: Secondary | ICD-10-CM | POA: Diagnosis not present

## 2013-07-05 DIAGNOSIS — J449 Chronic obstructive pulmonary disease, unspecified: Secondary | ICD-10-CM | POA: Diagnosis not present

## 2013-07-07 DIAGNOSIS — Z85819 Personal history of malignant neoplasm of unspecified site of lip, oral cavity, and pharynx: Secondary | ICD-10-CM | POA: Diagnosis not present

## 2013-07-07 DIAGNOSIS — C3490 Malignant neoplasm of unspecified part of unspecified bronchus or lung: Secondary | ICD-10-CM | POA: Diagnosis not present

## 2013-07-07 DIAGNOSIS — R599 Enlarged lymph nodes, unspecified: Secondary | ICD-10-CM | POA: Diagnosis not present

## 2013-07-07 LAB — COMPREHENSIVE METABOLIC PANEL
ALK PHOS: 91 U/L
AST: 11 U/L — AB (ref 15–37)
Albumin: 3.7 g/dL (ref 3.4–5.0)
Anion Gap: 10 (ref 7–16)
BUN: 19 mg/dL — AB (ref 7–18)
Bilirubin,Total: 0.2 mg/dL (ref 0.2–1.0)
CHLORIDE: 101 mmol/L (ref 98–107)
CREATININE: 0.91 mg/dL (ref 0.60–1.30)
Calcium, Total: 9.4 mg/dL (ref 8.5–10.1)
Co2: 26 mmol/L (ref 21–32)
EGFR (African American): >60
EGFR (Non-African Amer.): >60
Glucose: 206 mg/dL — ABNORMAL HIGH (ref 65–99)
Osmolality: 282 (ref 275–301)
Potassium: 4.2 mmol/L (ref 3.5–5.1)
SGPT (ALT): 15 U/L (ref 12–78)
Sodium: 137 mmol/L (ref 136–145)
Total Protein: 7.6 g/dL (ref 6.4–8.2)

## 2013-07-07 LAB — CBC CANCER CENTER
Basophil #: 0 x10 3/mm (ref 0.0–0.1)
Basophil %: 0.3 %
EOS ABS: 0 x10 3/mm (ref 0.0–0.7)
Eosinophil %: 0.1 %
HCT: 46.1 % (ref 40.0–52.0)
HGB: 15.3 g/dL (ref 13.0–18.0)
LYMPHS ABS: 0.6 x10 3/mm — AB (ref 1.0–3.6)
LYMPHS PCT: 6.6 %
MCH: 29 pg (ref 26.0–34.0)
MCHC: 33.2 g/dL (ref 32.0–36.0)
MCV: 88 fL (ref 80–100)
MONO ABS: 0.1 x10 3/mm — AB (ref 0.2–1.0)
Monocyte %: 0.8 %
NEUTROS ABS: 8.2 x10 3/mm — AB (ref 1.4–6.5)
Neutrophil %: 92.2 %
Platelet: 376 x10 3/mm (ref 150–440)
RBC: 5.26 10*6/uL (ref 4.40–5.90)
RDW: 14.7 % — AB (ref 11.5–14.5)
WBC: 8.9 x10 3/mm (ref 3.8–10.6)

## 2013-07-14 LAB — CBC CANCER CENTER
Basophil #: 0.1 x10 3/mm (ref 0.0–0.1)
Basophil %: 1 %
Eosinophil #: 0.2 x10 3/mm (ref 0.0–0.7)
Eosinophil %: 3 %
HCT: 41.2 % (ref 40.0–52.0)
HGB: 14.2 g/dL (ref 13.0–18.0)
LYMPHS ABS: 1 x10 3/mm (ref 1.0–3.6)
Lymphocyte %: 16.1 %
MCH: 29.4 pg (ref 26.0–34.0)
MCHC: 34.5 g/dL (ref 32.0–36.0)
MCV: 85 fL (ref 80–100)
Monocyte #: 0.2 x10 3/mm (ref 0.2–1.0)
Monocyte %: 2.8 %
NEUTROS ABS: 4.6 x10 3/mm (ref 1.4–6.5)
NEUTROS PCT: 77.1 %
Platelet: 276 x10 3/mm (ref 150–440)
RBC: 4.83 10*6/uL (ref 4.40–5.90)
RDW: 14.5 % (ref 11.5–14.5)
WBC: 6 x10 3/mm (ref 3.8–10.6)

## 2013-07-16 ENCOUNTER — Ambulatory Visit: Payer: Self-pay | Admitting: Oncology

## 2013-07-16 DIAGNOSIS — R5383 Other fatigue: Secondary | ICD-10-CM | POA: Diagnosis not present

## 2013-07-16 DIAGNOSIS — Z79899 Other long term (current) drug therapy: Secondary | ICD-10-CM | POA: Diagnosis not present

## 2013-07-16 DIAGNOSIS — Z5111 Encounter for antineoplastic chemotherapy: Secondary | ICD-10-CM | POA: Diagnosis not present

## 2013-07-16 DIAGNOSIS — F329 Major depressive disorder, single episode, unspecified: Secondary | ICD-10-CM | POA: Diagnosis not present

## 2013-07-16 DIAGNOSIS — Z923 Personal history of irradiation: Secondary | ICD-10-CM | POA: Diagnosis not present

## 2013-07-16 DIAGNOSIS — R5381 Other malaise: Secondary | ICD-10-CM | POA: Diagnosis not present

## 2013-07-16 DIAGNOSIS — H9190 Unspecified hearing loss, unspecified ear: Secondary | ICD-10-CM | POA: Diagnosis not present

## 2013-07-16 DIAGNOSIS — J449 Chronic obstructive pulmonary disease, unspecified: Secondary | ICD-10-CM | POA: Diagnosis not present

## 2013-07-16 DIAGNOSIS — C3490 Malignant neoplasm of unspecified part of unspecified bronchus or lung: Secondary | ICD-10-CM | POA: Diagnosis not present

## 2013-07-16 DIAGNOSIS — F411 Generalized anxiety disorder: Secondary | ICD-10-CM | POA: Diagnosis not present

## 2013-07-16 DIAGNOSIS — F3289 Other specified depressive episodes: Secondary | ICD-10-CM | POA: Diagnosis not present

## 2013-07-16 DIAGNOSIS — Z85819 Personal history of malignant neoplasm of unspecified site of lip, oral cavity, and pharynx: Secondary | ICD-10-CM | POA: Diagnosis not present

## 2013-07-16 DIAGNOSIS — F17200 Nicotine dependence, unspecified, uncomplicated: Secondary | ICD-10-CM | POA: Diagnosis not present

## 2013-07-16 DIAGNOSIS — Z9221 Personal history of antineoplastic chemotherapy: Secondary | ICD-10-CM | POA: Diagnosis not present

## 2013-07-21 LAB — CBC CANCER CENTER
BASOS ABS: 0 x10 3/mm (ref 0.0–0.1)
BASOS PCT: 1.4 %
EOS ABS: 0.1 x10 3/mm (ref 0.0–0.7)
Eosinophil %: 5.7 %
HCT: 42.9 % (ref 40.0–52.0)
HGB: 14 g/dL (ref 13.0–18.0)
Lymphocyte #: 1.1 x10 3/mm (ref 1.0–3.6)
Lymphocyte %: 46.5 %
MCH: 29.1 pg (ref 26.0–34.0)
MCHC: 32.7 g/dL (ref 32.0–36.0)
MCV: 89 fL (ref 80–100)
MONO ABS: 0.5 x10 3/mm (ref 0.2–1.0)
Monocyte %: 23 %
NEUTROS ABS: 0.6 x10 3/mm — AB (ref 1.4–6.5)
Neutrophil %: 23.4 %
PLATELETS: 213 x10 3/mm (ref 150–440)
RBC: 4.82 10*6/uL (ref 4.40–5.90)
RDW: 15.2 % — ABNORMAL HIGH (ref 11.5–14.5)
WBC: 2.4 x10 3/mm — AB (ref 3.8–10.6)

## 2013-07-28 DIAGNOSIS — Z85819 Personal history of malignant neoplasm of unspecified site of lip, oral cavity, and pharynx: Secondary | ICD-10-CM | POA: Diagnosis not present

## 2013-07-28 DIAGNOSIS — R5383 Other fatigue: Secondary | ICD-10-CM | POA: Diagnosis not present

## 2013-07-28 DIAGNOSIS — C3490 Malignant neoplasm of unspecified part of unspecified bronchus or lung: Secondary | ICD-10-CM | POA: Diagnosis not present

## 2013-07-28 DIAGNOSIS — R5381 Other malaise: Secondary | ICD-10-CM | POA: Diagnosis not present

## 2013-07-28 LAB — COMPREHENSIVE METABOLIC PANEL
ANION GAP: 9 (ref 7–16)
AST: 15 U/L (ref 15–37)
Albumin: 3.9 g/dL (ref 3.4–5.0)
Alkaline Phosphatase: 64 U/L
BUN: 26 mg/dL — ABNORMAL HIGH (ref 7–18)
Bilirubin,Total: 0.2 mg/dL (ref 0.2–1.0)
Calcium, Total: 8.6 mg/dL (ref 8.5–10.1)
Chloride: 103 mmol/L (ref 98–107)
Co2: 29 mmol/L (ref 21–32)
Creatinine: 0.83 mg/dL (ref 0.60–1.30)
EGFR (Non-African Amer.): >60
Glucose: 124 mg/dL — ABNORMAL HIGH (ref 65–99)
Osmolality: 287 (ref 275–301)
Potassium: 4.2 mmol/L (ref 3.5–5.1)
SGPT (ALT): 25 U/L (ref 12–78)
Sodium: 141 mmol/L (ref 136–145)
Total Protein: 7.1 g/dL (ref 6.4–8.2)

## 2013-07-28 LAB — CBC CANCER CENTER
BASOS ABS: 0 x10 3/mm (ref 0.0–0.1)
Basophil %: 0.7 %
EOS ABS: 0.1 x10 3/mm (ref 0.0–0.7)
Eosinophil %: 1.7 %
HCT: 42.5 % (ref 40.0–52.0)
HGB: 14.7 g/dL (ref 13.0–18.0)
LYMPHS ABS: 1.4 x10 3/mm (ref 1.0–3.6)
Lymphocyte %: 23.7 %
MCH: 30 pg (ref 26.0–34.0)
MCHC: 34.6 g/dL (ref 32.0–36.0)
MCV: 87 fL (ref 80–100)
Monocyte #: 0.8 x10 3/mm (ref 0.2–1.0)
Monocyte %: 14 %
NEUTROS ABS: 3.5 x10 3/mm (ref 1.4–6.5)
Neutrophil %: 59.9 %
PLATELETS: 207 x10 3/mm (ref 150–440)
RBC: 4.91 10*6/uL (ref 4.40–5.90)
RDW: 15.5 % — ABNORMAL HIGH (ref 11.5–14.5)
WBC: 5.9 x10 3/mm (ref 3.8–10.6)

## 2013-08-04 LAB — CBC CANCER CENTER
BASOS ABS: 0 x10 3/mm (ref 0.0–0.1)
Basophil %: 1.1 %
Eosinophil #: 0.1 x10 3/mm (ref 0.0–0.7)
Eosinophil %: 1.8 %
HCT: 40.2 % (ref 40.0–52.0)
HGB: 14.1 g/dL (ref 13.0–18.0)
Lymphocyte #: 1.1 x10 3/mm (ref 1.0–3.6)
Lymphocyte %: 23.9 %
MCH: 29.5 pg (ref 26.0–34.0)
MCHC: 34.9 g/dL (ref 32.0–36.0)
MCV: 84 fL (ref 80–100)
MONO ABS: 0.2 x10 3/mm (ref 0.2–1.0)
Monocyte %: 3.9 %
NEUTROS ABS: 3.1 x10 3/mm (ref 1.4–6.5)
NEUTROS PCT: 69.3 %
Platelet: 135 x10 3/mm — ABNORMAL LOW (ref 150–440)
RBC: 4.77 10*6/uL (ref 4.40–5.90)
RDW: 15.3 % — AB (ref 11.5–14.5)
WBC: 4.4 x10 3/mm (ref 3.8–10.6)

## 2013-08-11 LAB — CBC CANCER CENTER
BASOS ABS: 0 x10 3/mm (ref 0.0–0.1)
BASOS PCT: 0.3 %
Eosinophil #: 0.1 x10 3/mm (ref 0.0–0.7)
Eosinophil %: 1.8 %
HCT: 41.5 % (ref 40.0–52.0)
HGB: 14.1 g/dL (ref 13.0–18.0)
LYMPHS PCT: 40.6 %
Lymphocyte #: 1.2 x10 3/mm (ref 1.0–3.6)
MCH: 29.9 pg (ref 26.0–34.0)
MCHC: 34 g/dL (ref 32.0–36.0)
MCV: 88 fL (ref 80–100)
MONO ABS: 0.5 x10 3/mm (ref 0.2–1.0)
MONOS PCT: 18 %
Neutrophil #: 1.2 x10 3/mm — ABNORMAL LOW (ref 1.4–6.5)
Neutrophil %: 39.3 %
Platelet: 175 x10 3/mm (ref 150–440)
RBC: 4.71 10*6/uL (ref 4.40–5.90)
RDW: 16 % — ABNORMAL HIGH (ref 11.5–14.5)
WBC: 2.9 x10 3/mm — ABNORMAL LOW (ref 3.8–10.6)

## 2013-08-11 LAB — HEPATIC FUNCTION PANEL A (ARMC)
Albumin: 3.9 g/dL (ref 3.4–5.0)
Alkaline Phosphatase: 55 U/L
BILIRUBIN TOTAL: 0.2 mg/dL (ref 0.2–1.0)
SGOT(AST): 17 U/L (ref 15–37)
SGPT (ALT): 26 U/L (ref 12–78)
Total Protein: 7 g/dL (ref 6.4–8.2)

## 2013-08-11 LAB — BASIC METABOLIC PANEL
Anion Gap: 5 — ABNORMAL LOW (ref 7–16)
BUN: 23 mg/dL — ABNORMAL HIGH (ref 7–18)
CO2: 29 mmol/L (ref 21–32)
Calcium, Total: 9.1 mg/dL (ref 8.5–10.1)
Chloride: 103 mmol/L (ref 98–107)
Creatinine: 0.63 mg/dL (ref 0.60–1.30)
EGFR (African American): >60
EGFR (Non-African Amer.): >60
GLUCOSE: 114 mg/dL — AB (ref 65–99)
OSMOLALITY: 278 (ref 275–301)
Potassium: 4.3 mmol/L (ref 3.5–5.1)
Sodium: 137 mmol/L (ref 136–145)

## 2013-08-16 ENCOUNTER — Ambulatory Visit: Payer: Self-pay | Admitting: Oncology

## 2013-08-16 DIAGNOSIS — R5381 Other malaise: Secondary | ICD-10-CM | POA: Diagnosis not present

## 2013-08-16 DIAGNOSIS — F3289 Other specified depressive episodes: Secondary | ICD-10-CM | POA: Diagnosis not present

## 2013-08-16 DIAGNOSIS — Z8701 Personal history of pneumonia (recurrent): Secondary | ICD-10-CM | POA: Diagnosis not present

## 2013-08-16 DIAGNOSIS — C3490 Malignant neoplasm of unspecified part of unspecified bronchus or lung: Secondary | ICD-10-CM | POA: Diagnosis not present

## 2013-08-16 DIAGNOSIS — F17200 Nicotine dependence, unspecified, uncomplicated: Secondary | ICD-10-CM | POA: Diagnosis not present

## 2013-08-16 DIAGNOSIS — R209 Unspecified disturbances of skin sensation: Secondary | ICD-10-CM | POA: Diagnosis not present

## 2013-08-16 DIAGNOSIS — F329 Major depressive disorder, single episode, unspecified: Secondary | ICD-10-CM | POA: Diagnosis not present

## 2013-08-16 DIAGNOSIS — J449 Chronic obstructive pulmonary disease, unspecified: Secondary | ICD-10-CM | POA: Diagnosis not present

## 2013-08-16 DIAGNOSIS — F919 Conduct disorder, unspecified: Secondary | ICD-10-CM | POA: Diagnosis not present

## 2013-08-16 DIAGNOSIS — Z923 Personal history of irradiation: Secondary | ICD-10-CM | POA: Diagnosis not present

## 2013-08-16 DIAGNOSIS — Z79899 Other long term (current) drug therapy: Secondary | ICD-10-CM | POA: Diagnosis not present

## 2013-08-16 DIAGNOSIS — Z85819 Personal history of malignant neoplasm of unspecified site of lip, oral cavity, and pharynx: Secondary | ICD-10-CM | POA: Diagnosis not present

## 2013-08-16 DIAGNOSIS — H9190 Unspecified hearing loss, unspecified ear: Secondary | ICD-10-CM | POA: Diagnosis not present

## 2013-08-16 DIAGNOSIS — F411 Generalized anxiety disorder: Secondary | ICD-10-CM | POA: Diagnosis not present

## 2013-08-16 DIAGNOSIS — Z9221 Personal history of antineoplastic chemotherapy: Secondary | ICD-10-CM | POA: Diagnosis not present

## 2013-08-16 DIAGNOSIS — Z5111 Encounter for antineoplastic chemotherapy: Secondary | ICD-10-CM | POA: Diagnosis not present

## 2013-08-18 DIAGNOSIS — C3490 Malignant neoplasm of unspecified part of unspecified bronchus or lung: Secondary | ICD-10-CM | POA: Diagnosis not present

## 2013-08-18 DIAGNOSIS — Z85819 Personal history of malignant neoplasm of unspecified site of lip, oral cavity, and pharynx: Secondary | ICD-10-CM | POA: Diagnosis not present

## 2013-08-18 DIAGNOSIS — F919 Conduct disorder, unspecified: Secondary | ICD-10-CM | POA: Diagnosis not present

## 2013-08-18 DIAGNOSIS — R5383 Other fatigue: Secondary | ICD-10-CM | POA: Diagnosis not present

## 2013-08-18 DIAGNOSIS — R5381 Other malaise: Secondary | ICD-10-CM | POA: Diagnosis not present

## 2013-08-18 LAB — COMPREHENSIVE METABOLIC PANEL
ALT: 26 U/L (ref 12–78)
Albumin: 4.2 g/dL (ref 3.4–5.0)
Alkaline Phosphatase: 61 U/L
Anion Gap: 10 (ref 7–16)
BUN: 23 mg/dL — ABNORMAL HIGH (ref 7–18)
Bilirubin,Total: 0.5 mg/dL (ref 0.2–1.0)
Calcium, Total: 9.4 mg/dL (ref 8.5–10.1)
Chloride: 101 mmol/L (ref 98–107)
Co2: 27 mmol/L (ref 21–32)
Creatinine: 0.83 mg/dL (ref 0.60–1.30)
EGFR (African American): >60
Glucose: 189 mg/dL — ABNORMAL HIGH (ref 65–99)
OSMOLALITY: 284 (ref 275–301)
Potassium: 4 mmol/L (ref 3.5–5.1)
SGOT(AST): 14 U/L — ABNORMAL LOW (ref 15–37)
SODIUM: 138 mmol/L (ref 136–145)
TOTAL PROTEIN: 7.4 g/dL (ref 6.4–8.2)

## 2013-08-18 LAB — CBC CANCER CENTER
BASOS ABS: 0 x10 3/mm (ref 0.0–0.1)
BASOS PCT: 0.3 %
Eosinophil #: 0 x10 3/mm (ref 0.0–0.7)
Eosinophil %: 0 %
HCT: 41.2 % (ref 40.0–52.0)
HGB: 14.1 g/dL (ref 13.0–18.0)
Lymphocyte #: 0.6 x10 3/mm — ABNORMAL LOW (ref 1.0–3.6)
Lymphocyte %: 13.3 %
MCH: 30.1 pg (ref 26.0–34.0)
MCHC: 34.3 g/dL (ref 32.0–36.0)
MCV: 88 fL (ref 80–100)
Monocyte #: 0.1 x10 3/mm — ABNORMAL LOW (ref 0.2–1.0)
Monocyte %: 2 %
NEUTROS PCT: 84.4 %
Neutrophil #: 3.9 x10 3/mm (ref 1.4–6.5)
PLATELETS: 170 x10 3/mm (ref 150–440)
RBC: 4.7 10*6/uL (ref 4.40–5.90)
RDW: 16.4 % — ABNORMAL HIGH (ref 11.5–14.5)
WBC: 4.7 x10 3/mm (ref 3.8–10.6)

## 2013-08-24 DIAGNOSIS — C3490 Malignant neoplasm of unspecified part of unspecified bronchus or lung: Secondary | ICD-10-CM | POA: Diagnosis not present

## 2013-08-25 LAB — CBC CANCER CENTER
BASOS ABS: 0 x10 3/mm (ref 0.0–0.1)
Basophil %: 0.4 %
EOS ABS: 0 x10 3/mm (ref 0.0–0.7)
Eosinophil %: 1.2 %
HCT: 36.9 % — ABNORMAL LOW (ref 40.0–52.0)
HGB: 12.4 g/dL — AB (ref 13.0–18.0)
LYMPHS ABS: 0.9 x10 3/mm — AB (ref 1.0–3.6)
LYMPHS PCT: 25.6 %
MCH: 29.8 pg (ref 26.0–34.0)
MCHC: 33.7 g/dL (ref 32.0–36.0)
MCV: 89 fL (ref 80–100)
MONO ABS: 0.2 x10 3/mm (ref 0.2–1.0)
Monocyte %: 5.6 %
Neutrophil #: 2.3 x10 3/mm (ref 1.4–6.5)
Neutrophil %: 67.2 %
Platelet: 145 x10 3/mm — ABNORMAL LOW (ref 150–440)
RBC: 4.17 10*6/uL — ABNORMAL LOW (ref 4.40–5.90)
RDW: 16.9 % — ABNORMAL HIGH (ref 11.5–14.5)
WBC: 3.5 x10 3/mm — ABNORMAL LOW (ref 3.8–10.6)

## 2013-09-01 ENCOUNTER — Ambulatory Visit: Payer: Self-pay | Admitting: Oncology

## 2013-09-01 DIAGNOSIS — C3490 Malignant neoplasm of unspecified part of unspecified bronchus or lung: Secondary | ICD-10-CM | POA: Diagnosis not present

## 2013-09-01 LAB — CBC CANCER CENTER
BASOS ABS: 0 x10 3/mm (ref 0.0–0.1)
BASOS PCT: 0.2 %
Eosinophil #: 0 x10 3/mm (ref 0.0–0.7)
Eosinophil %: 0.9 %
HCT: 34.8 % — ABNORMAL LOW (ref 40.0–52.0)
HGB: 11.9 g/dL — ABNORMAL LOW (ref 13.0–18.0)
LYMPHS PCT: 30.1 %
Lymphocyte #: 1 x10 3/mm (ref 1.0–3.6)
MCH: 30.4 pg (ref 26.0–34.0)
MCHC: 34.1 g/dL (ref 32.0–36.0)
MCV: 89 fL (ref 80–100)
Monocyte #: 0.6 x10 3/mm (ref 0.2–1.0)
Monocyte %: 18.3 %
NEUTROS PCT: 50.5 %
Neutrophil #: 1.7 x10 3/mm (ref 1.4–6.5)
Platelet: 200 x10 3/mm (ref 150–440)
RBC: 3.91 10*6/uL — ABNORMAL LOW (ref 4.40–5.90)
RDW: 17.4 % — ABNORMAL HIGH (ref 11.5–14.5)
WBC: 3.4 x10 3/mm — ABNORMAL LOW (ref 3.8–10.6)

## 2013-09-08 DIAGNOSIS — Z85819 Personal history of malignant neoplasm of unspecified site of lip, oral cavity, and pharynx: Secondary | ICD-10-CM | POA: Diagnosis not present

## 2013-09-08 DIAGNOSIS — F919 Conduct disorder, unspecified: Secondary | ICD-10-CM | POA: Diagnosis not present

## 2013-09-08 DIAGNOSIS — C3490 Malignant neoplasm of unspecified part of unspecified bronchus or lung: Secondary | ICD-10-CM | POA: Diagnosis not present

## 2013-09-08 LAB — CBC CANCER CENTER
BASOS PCT: 0.8 %
Basophil #: 0 x10 3/mm (ref 0.0–0.1)
EOS PCT: 0.4 %
Eosinophil #: 0 x10 3/mm (ref 0.0–0.7)
HCT: 35.1 % — ABNORMAL LOW (ref 40.0–52.0)
HGB: 12.2 g/dL — AB (ref 13.0–18.0)
LYMPHS ABS: 1 x10 3/mm (ref 1.0–3.6)
LYMPHS PCT: 26.5 %
MCH: 30.8 pg (ref 26.0–34.0)
MCHC: 34.8 g/dL (ref 32.0–36.0)
MCV: 89 fL (ref 80–100)
MONO ABS: 0.5 x10 3/mm (ref 0.2–1.0)
Monocyte %: 12 %
NEUTROS ABS: 2.4 x10 3/mm (ref 1.4–6.5)
Neutrophil %: 60.3 %
Platelet: 251 x10 3/mm (ref 150–440)
RBC: 3.97 10*6/uL — ABNORMAL LOW (ref 4.40–5.90)
RDW: 18.5 % — ABNORMAL HIGH (ref 11.5–14.5)
WBC: 4 x10 3/mm (ref 3.8–10.6)

## 2013-09-08 LAB — COMPREHENSIVE METABOLIC PANEL
ANION GAP: 7 (ref 7–16)
Albumin: 3.6 g/dL (ref 3.4–5.0)
Alkaline Phosphatase: 62 U/L
BILIRUBIN TOTAL: 0.2 mg/dL (ref 0.2–1.0)
BUN: 13 mg/dL (ref 7–18)
CALCIUM: 9.2 mg/dL (ref 8.5–10.1)
CREATININE: 0.86 mg/dL (ref 0.60–1.30)
Chloride: 105 mmol/L (ref 98–107)
Co2: 27 mmol/L (ref 21–32)
EGFR (African American): >60
GLUCOSE: 132 mg/dL — AB (ref 65–99)
Osmolality: 280 (ref 275–301)
Potassium: 4 mmol/L (ref 3.5–5.1)
SGOT(AST): 21 U/L (ref 15–37)
SGPT (ALT): 20 U/L (ref 12–78)
Sodium: 139 mmol/L (ref 136–145)
TOTAL PROTEIN: 7.1 g/dL (ref 6.4–8.2)

## 2013-09-15 ENCOUNTER — Ambulatory Visit: Payer: Self-pay | Admitting: Oncology

## 2013-09-15 DIAGNOSIS — F411 Generalized anxiety disorder: Secondary | ICD-10-CM | POA: Diagnosis not present

## 2013-09-15 DIAGNOSIS — F17200 Nicotine dependence, unspecified, uncomplicated: Secondary | ICD-10-CM | POA: Diagnosis not present

## 2013-09-15 DIAGNOSIS — G620 Drug-induced polyneuropathy: Secondary | ICD-10-CM | POA: Diagnosis not present

## 2013-09-15 DIAGNOSIS — Z923 Personal history of irradiation: Secondary | ICD-10-CM | POA: Diagnosis not present

## 2013-09-15 DIAGNOSIS — Z85819 Personal history of malignant neoplasm of unspecified site of lip, oral cavity, and pharynx: Secondary | ICD-10-CM | POA: Diagnosis not present

## 2013-09-15 DIAGNOSIS — F329 Major depressive disorder, single episode, unspecified: Secondary | ICD-10-CM | POA: Diagnosis not present

## 2013-09-15 DIAGNOSIS — H9190 Unspecified hearing loss, unspecified ear: Secondary | ICD-10-CM | POA: Diagnosis not present

## 2013-09-15 DIAGNOSIS — Z9221 Personal history of antineoplastic chemotherapy: Secondary | ICD-10-CM | POA: Diagnosis not present

## 2013-09-15 DIAGNOSIS — Z510 Encounter for antineoplastic radiation therapy: Secondary | ICD-10-CM | POA: Diagnosis not present

## 2013-09-15 DIAGNOSIS — R5383 Other fatigue: Secondary | ICD-10-CM | POA: Diagnosis not present

## 2013-09-15 DIAGNOSIS — C3490 Malignant neoplasm of unspecified part of unspecified bronchus or lung: Secondary | ICD-10-CM | POA: Diagnosis not present

## 2013-09-15 DIAGNOSIS — Z79899 Other long term (current) drug therapy: Secondary | ICD-10-CM | POA: Diagnosis not present

## 2013-09-15 DIAGNOSIS — J449 Chronic obstructive pulmonary disease, unspecified: Secondary | ICD-10-CM | POA: Diagnosis not present

## 2013-09-15 DIAGNOSIS — R5381 Other malaise: Secondary | ICD-10-CM | POA: Diagnosis not present

## 2013-09-15 DIAGNOSIS — F3289 Other specified depressive episodes: Secondary | ICD-10-CM | POA: Diagnosis not present

## 2013-09-15 DIAGNOSIS — Z5111 Encounter for antineoplastic chemotherapy: Secondary | ICD-10-CM | POA: Diagnosis not present

## 2013-09-22 DIAGNOSIS — Z85819 Personal history of malignant neoplasm of unspecified site of lip, oral cavity, and pharynx: Secondary | ICD-10-CM | POA: Diagnosis not present

## 2013-09-23 DIAGNOSIS — Z85819 Personal history of malignant neoplasm of unspecified site of lip, oral cavity, and pharynx: Secondary | ICD-10-CM | POA: Diagnosis not present

## 2013-09-28 DIAGNOSIS — Z85819 Personal history of malignant neoplasm of unspecified site of lip, oral cavity, and pharynx: Secondary | ICD-10-CM | POA: Diagnosis not present

## 2013-10-04 DIAGNOSIS — C3490 Malignant neoplasm of unspecified part of unspecified bronchus or lung: Secondary | ICD-10-CM | POA: Diagnosis not present

## 2013-10-04 DIAGNOSIS — Z85819 Personal history of malignant neoplasm of unspecified site of lip, oral cavity, and pharynx: Secondary | ICD-10-CM | POA: Diagnosis not present

## 2013-10-04 LAB — CBC CANCER CENTER
BASOS ABS: 0 x10 3/mm (ref 0.0–0.1)
BASOS PCT: 0.6 %
EOS ABS: 0.1 x10 3/mm (ref 0.0–0.7)
Eosinophil %: 1.5 %
HCT: 40.6 % (ref 40.0–52.0)
HGB: 13.7 g/dL (ref 13.0–18.0)
Lymphocyte #: 1 x10 3/mm (ref 1.0–3.6)
Lymphocyte %: 14.9 %
MCH: 31.4 pg (ref 26.0–34.0)
MCHC: 33.7 g/dL (ref 32.0–36.0)
MCV: 93 fL (ref 80–100)
Monocyte #: 0.9 x10 3/mm (ref 0.2–1.0)
Monocyte %: 12.6 %
NEUTROS PCT: 70.4 %
Neutrophil #: 4.8 x10 3/mm (ref 1.4–6.5)
PLATELETS: 237 x10 3/mm (ref 150–440)
RBC: 4.36 10*6/uL — AB (ref 4.40–5.90)
RDW: 19.1 % — AB (ref 11.5–14.5)
WBC: 6.8 x10 3/mm (ref 3.8–10.6)

## 2013-10-04 LAB — COMPREHENSIVE METABOLIC PANEL
ANION GAP: 9 (ref 7–16)
Albumin: 3.9 g/dL (ref 3.4–5.0)
Alkaline Phosphatase: 66 U/L
BUN: 14 mg/dL (ref 7–18)
Bilirubin,Total: 0.4 mg/dL (ref 0.2–1.0)
CHLORIDE: 102 mmol/L (ref 98–107)
Calcium, Total: 9.3 mg/dL (ref 8.5–10.1)
Co2: 29 mmol/L (ref 21–32)
Creatinine: 0.96 mg/dL (ref 0.60–1.30)
EGFR (African American): >60
EGFR (Non-African Amer.): >60
Glucose: 114 mg/dL — ABNORMAL HIGH (ref 65–99)
Osmolality: 281 (ref 275–301)
Potassium: 4.1 mmol/L (ref 3.5–5.1)
SGOT(AST): 12 U/L — ABNORMAL LOW (ref 15–37)
SGPT (ALT): 22 U/L (ref 12–78)
Sodium: 140 mmol/L (ref 136–145)
TOTAL PROTEIN: 7.1 g/dL (ref 6.4–8.2)

## 2013-10-05 DIAGNOSIS — Z85819 Personal history of malignant neoplasm of unspecified site of lip, oral cavity, and pharynx: Secondary | ICD-10-CM | POA: Diagnosis not present

## 2013-10-06 DIAGNOSIS — Z85819 Personal history of malignant neoplasm of unspecified site of lip, oral cavity, and pharynx: Secondary | ICD-10-CM | POA: Diagnosis not present

## 2013-10-07 DIAGNOSIS — Z85819 Personal history of malignant neoplasm of unspecified site of lip, oral cavity, and pharynx: Secondary | ICD-10-CM | POA: Diagnosis not present

## 2013-10-08 DIAGNOSIS — Z85819 Personal history of malignant neoplasm of unspecified site of lip, oral cavity, and pharynx: Secondary | ICD-10-CM | POA: Diagnosis not present

## 2013-10-11 DIAGNOSIS — Z85819 Personal history of malignant neoplasm of unspecified site of lip, oral cavity, and pharynx: Secondary | ICD-10-CM | POA: Diagnosis not present

## 2013-10-12 DIAGNOSIS — Z85819 Personal history of malignant neoplasm of unspecified site of lip, oral cavity, and pharynx: Secondary | ICD-10-CM | POA: Diagnosis not present

## 2013-10-13 DIAGNOSIS — Z85819 Personal history of malignant neoplasm of unspecified site of lip, oral cavity, and pharynx: Secondary | ICD-10-CM | POA: Diagnosis not present

## 2013-10-14 DIAGNOSIS — Z85819 Personal history of malignant neoplasm of unspecified site of lip, oral cavity, and pharynx: Secondary | ICD-10-CM | POA: Diagnosis not present

## 2013-10-14 LAB — CBC CANCER CENTER
BASOS ABS: 0 x10 3/mm (ref 0.0–0.1)
BASOS PCT: 0.3 %
EOS ABS: 0.1 x10 3/mm (ref 0.0–0.7)
Eosinophil %: 1.9 %
HCT: 37.4 % — ABNORMAL LOW (ref 40.0–52.0)
HGB: 12.6 g/dL — ABNORMAL LOW (ref 13.0–18.0)
LYMPHS ABS: 0.6 x10 3/mm — AB (ref 1.0–3.6)
LYMPHS PCT: 13.3 %
MCH: 32 pg (ref 26.0–34.0)
MCHC: 33.7 g/dL (ref 32.0–36.0)
MCV: 95 fL (ref 80–100)
MONOS PCT: 12.4 %
Monocyte #: 0.6 x10 3/mm (ref 0.2–1.0)
NEUTROS ABS: 3.4 x10 3/mm (ref 1.4–6.5)
NEUTROS PCT: 72.1 %
Platelet: 239 x10 3/mm (ref 150–440)
RBC: 3.94 10*6/uL — AB (ref 4.40–5.90)
RDW: 18.3 % — AB (ref 11.5–14.5)
WBC: 4.7 x10 3/mm (ref 3.8–10.6)

## 2013-10-15 DIAGNOSIS — Z85819 Personal history of malignant neoplasm of unspecified site of lip, oral cavity, and pharynx: Secondary | ICD-10-CM | POA: Diagnosis not present

## 2013-10-16 ENCOUNTER — Ambulatory Visit: Payer: Self-pay | Admitting: Oncology

## 2013-10-18 DIAGNOSIS — Z85819 Personal history of malignant neoplasm of unspecified site of lip, oral cavity, and pharynx: Secondary | ICD-10-CM | POA: Diagnosis not present

## 2013-10-19 DIAGNOSIS — Z85819 Personal history of malignant neoplasm of unspecified site of lip, oral cavity, and pharynx: Secondary | ICD-10-CM | POA: Diagnosis not present

## 2013-10-20 DIAGNOSIS — Z85819 Personal history of malignant neoplasm of unspecified site of lip, oral cavity, and pharynx: Secondary | ICD-10-CM | POA: Diagnosis not present

## 2013-10-21 DIAGNOSIS — R5381 Other malaise: Secondary | ICD-10-CM | POA: Diagnosis not present

## 2013-10-21 DIAGNOSIS — R5383 Other fatigue: Secondary | ICD-10-CM | POA: Diagnosis not present

## 2013-10-21 DIAGNOSIS — G620 Drug-induced polyneuropathy: Secondary | ICD-10-CM | POA: Diagnosis not present

## 2013-10-21 DIAGNOSIS — Z85819 Personal history of malignant neoplasm of unspecified site of lip, oral cavity, and pharynx: Secondary | ICD-10-CM | POA: Diagnosis not present

## 2013-10-21 DIAGNOSIS — C3490 Malignant neoplasm of unspecified part of unspecified bronchus or lung: Secondary | ICD-10-CM | POA: Diagnosis not present

## 2013-10-21 LAB — COMPREHENSIVE METABOLIC PANEL
ALBUMIN: 3.8 g/dL (ref 3.4–5.0)
ANION GAP: 9 (ref 7–16)
Alkaline Phosphatase: 60 U/L
BUN: 19 mg/dL — ABNORMAL HIGH (ref 7–18)
Bilirubin,Total: 0.4 mg/dL (ref 0.2–1.0)
Calcium, Total: 9 mg/dL (ref 8.5–10.1)
Chloride: 102 mmol/L (ref 98–107)
Co2: 27 mmol/L (ref 21–32)
Creatinine: 1 mg/dL (ref 0.60–1.30)
EGFR (Non-African Amer.): >60
GLUCOSE: 115 mg/dL — AB (ref 65–99)
Osmolality: 279 (ref 275–301)
POTASSIUM: 4 mmol/L (ref 3.5–5.1)
SGOT(AST): 12 U/L — ABNORMAL LOW (ref 15–37)
SGPT (ALT): 22 U/L
Sodium: 138 mmol/L (ref 136–145)
Total Protein: 6.9 g/dL (ref 6.4–8.2)

## 2013-10-21 LAB — CBC CANCER CENTER
BASOS ABS: 0 x10 3/mm (ref 0.0–0.1)
Basophil %: 0.3 %
EOS PCT: 2.2 %
Eosinophil #: 0.1 x10 3/mm (ref 0.0–0.7)
HCT: 37.8 % — AB (ref 40.0–52.0)
HGB: 12.9 g/dL — ABNORMAL LOW (ref 13.0–18.0)
Lymphocyte #: 0.5 x10 3/mm — ABNORMAL LOW (ref 1.0–3.6)
Lymphocyte %: 13.5 %
MCH: 32.3 pg (ref 26.0–34.0)
MCHC: 34 g/dL (ref 32.0–36.0)
MCV: 95 fL (ref 80–100)
Monocyte #: 0.4 x10 3/mm (ref 0.2–1.0)
Monocyte %: 11.9 %
NEUTROS ABS: 2.7 x10 3/mm (ref 1.4–6.5)
NEUTROS PCT: 72.1 %
PLATELETS: 176 x10 3/mm (ref 150–440)
RBC: 3.98 10*6/uL — ABNORMAL LOW (ref 4.40–5.90)
RDW: 17.5 % — ABNORMAL HIGH (ref 11.5–14.5)
WBC: 3.7 x10 3/mm — ABNORMAL LOW (ref 3.8–10.6)

## 2013-10-22 DIAGNOSIS — Z85819 Personal history of malignant neoplasm of unspecified site of lip, oral cavity, and pharynx: Secondary | ICD-10-CM | POA: Diagnosis not present

## 2013-10-25 DIAGNOSIS — Z85819 Personal history of malignant neoplasm of unspecified site of lip, oral cavity, and pharynx: Secondary | ICD-10-CM | POA: Diagnosis not present

## 2013-10-26 DIAGNOSIS — Z85819 Personal history of malignant neoplasm of unspecified site of lip, oral cavity, and pharynx: Secondary | ICD-10-CM | POA: Diagnosis not present

## 2013-10-27 DIAGNOSIS — Z85819 Personal history of malignant neoplasm of unspecified site of lip, oral cavity, and pharynx: Secondary | ICD-10-CM | POA: Diagnosis not present

## 2013-10-28 DIAGNOSIS — Z85819 Personal history of malignant neoplasm of unspecified site of lip, oral cavity, and pharynx: Secondary | ICD-10-CM | POA: Diagnosis not present

## 2013-10-28 LAB — CBC CANCER CENTER
BASOS PCT: 0.2 %
Basophil #: 0 x10 3/mm (ref 0.0–0.1)
EOS ABS: 0 x10 3/mm (ref 0.0–0.7)
Eosinophil %: 1.1 %
HCT: 37.1 % — ABNORMAL LOW (ref 40.0–52.0)
HGB: 12.6 g/dL — AB (ref 13.0–18.0)
LYMPHS ABS: 0.5 x10 3/mm — AB (ref 1.0–3.6)
Lymphocyte %: 12.9 %
MCH: 32.5 pg (ref 26.0–34.0)
MCHC: 34 g/dL (ref 32.0–36.0)
MCV: 96 fL (ref 80–100)
Monocyte #: 0.5 x10 3/mm (ref 0.2–1.0)
Monocyte %: 12.8 %
NEUTROS ABS: 2.7 x10 3/mm (ref 1.4–6.5)
Neutrophil %: 73 %
Platelet: 146 x10 3/mm — ABNORMAL LOW (ref 150–440)
RBC: 3.88 10*6/uL — ABNORMAL LOW (ref 4.40–5.90)
RDW: 16.6 % — AB (ref 11.5–14.5)
WBC: 3.7 x10 3/mm — AB (ref 3.8–10.6)

## 2013-10-28 LAB — COMPREHENSIVE METABOLIC PANEL
ALBUMIN: 3.7 g/dL (ref 3.4–5.0)
AST: 10 U/L — AB (ref 15–37)
Alkaline Phosphatase: 62 U/L
Anion Gap: 9 (ref 7–16)
BUN: 15 mg/dL (ref 7–18)
Bilirubin,Total: 0.4 mg/dL (ref 0.2–1.0)
CHLORIDE: 102 mmol/L (ref 98–107)
CREATININE: 0.92 mg/dL (ref 0.60–1.30)
Calcium, Total: 8.6 mg/dL (ref 8.5–10.1)
Co2: 28 mmol/L (ref 21–32)
Glucose: 105 mg/dL — ABNORMAL HIGH (ref 65–99)
Osmolality: 279 (ref 275–301)
Potassium: 4 mmol/L (ref 3.5–5.1)
SGPT (ALT): 22 U/L
Sodium: 139 mmol/L (ref 136–145)
Total Protein: 6.7 g/dL (ref 6.4–8.2)

## 2013-10-29 DIAGNOSIS — Z85819 Personal history of malignant neoplasm of unspecified site of lip, oral cavity, and pharynx: Secondary | ICD-10-CM | POA: Diagnosis not present

## 2013-11-01 DIAGNOSIS — Z85819 Personal history of malignant neoplasm of unspecified site of lip, oral cavity, and pharynx: Secondary | ICD-10-CM | POA: Diagnosis not present

## 2013-11-02 DIAGNOSIS — Z85819 Personal history of malignant neoplasm of unspecified site of lip, oral cavity, and pharynx: Secondary | ICD-10-CM | POA: Diagnosis not present

## 2013-11-03 DIAGNOSIS — Z85819 Personal history of malignant neoplasm of unspecified site of lip, oral cavity, and pharynx: Secondary | ICD-10-CM | POA: Diagnosis not present

## 2013-11-04 DIAGNOSIS — Z85819 Personal history of malignant neoplasm of unspecified site of lip, oral cavity, and pharynx: Secondary | ICD-10-CM | POA: Diagnosis not present

## 2013-11-04 DIAGNOSIS — C3490 Malignant neoplasm of unspecified part of unspecified bronchus or lung: Secondary | ICD-10-CM | POA: Diagnosis not present

## 2013-11-04 LAB — CBC CANCER CENTER
Basophil #: 0 x10 3/mm (ref 0.0–0.1)
Basophil %: 0.3 %
EOS PCT: 0.7 %
Eosinophil #: 0 x10 3/mm (ref 0.0–0.7)
HCT: 35.6 % — AB (ref 40.0–52.0)
HGB: 12.1 g/dL — ABNORMAL LOW (ref 13.0–18.0)
LYMPHS ABS: 0.3 x10 3/mm — AB (ref 1.0–3.6)
Lymphocyte %: 8.6 %
MCH: 32.8 pg (ref 26.0–34.0)
MCHC: 34.1 g/dL (ref 32.0–36.0)
MCV: 96 fL (ref 80–100)
Monocyte #: 0.4 x10 3/mm (ref 0.2–1.0)
Monocyte %: 11.3 %
NEUTROS ABS: 2.8 x10 3/mm (ref 1.4–6.5)
Neutrophil %: 79.1 %
PLATELETS: 109 x10 3/mm — AB (ref 150–440)
RBC: 3.7 10*6/uL — AB (ref 4.40–5.90)
RDW: 15.8 % — ABNORMAL HIGH (ref 11.5–14.5)
WBC: 3.5 x10 3/mm — ABNORMAL LOW (ref 3.8–10.6)

## 2013-11-04 LAB — COMPREHENSIVE METABOLIC PANEL
ALT: 22 U/L
ANION GAP: 7 (ref 7–16)
AST: 11 U/L — AB (ref 15–37)
Albumin: 3.6 g/dL (ref 3.4–5.0)
Alkaline Phosphatase: 64 U/L
BUN: 15 mg/dL (ref 7–18)
Bilirubin,Total: 0.4 mg/dL (ref 0.2–1.0)
CALCIUM: 8.5 mg/dL (ref 8.5–10.1)
Chloride: 101 mmol/L (ref 98–107)
Co2: 31 mmol/L (ref 21–32)
Creatinine: 0.98 mg/dL (ref 0.60–1.30)
EGFR (African American): >60
Glucose: 120 mg/dL — ABNORMAL HIGH (ref 65–99)
Osmolality: 280 (ref 275–301)
Potassium: 4.3 mmol/L (ref 3.5–5.1)
Sodium: 139 mmol/L (ref 136–145)
Total Protein: 6.8 g/dL (ref 6.4–8.2)

## 2013-11-05 DIAGNOSIS — Z85819 Personal history of malignant neoplasm of unspecified site of lip, oral cavity, and pharynx: Secondary | ICD-10-CM | POA: Diagnosis not present

## 2013-11-08 DIAGNOSIS — Z85819 Personal history of malignant neoplasm of unspecified site of lip, oral cavity, and pharynx: Secondary | ICD-10-CM | POA: Diagnosis not present

## 2013-11-09 DIAGNOSIS — Z85819 Personal history of malignant neoplasm of unspecified site of lip, oral cavity, and pharynx: Secondary | ICD-10-CM | POA: Diagnosis not present

## 2013-11-10 DIAGNOSIS — Z85819 Personal history of malignant neoplasm of unspecified site of lip, oral cavity, and pharynx: Secondary | ICD-10-CM | POA: Diagnosis not present

## 2013-11-11 DIAGNOSIS — Z85819 Personal history of malignant neoplasm of unspecified site of lip, oral cavity, and pharynx: Secondary | ICD-10-CM | POA: Diagnosis not present

## 2013-11-11 LAB — CBC CANCER CENTER
BASOS ABS: 0 x10 3/mm (ref 0.0–0.1)
BASOS PCT: 0.3 %
EOS ABS: 0 x10 3/mm (ref 0.0–0.7)
Eosinophil %: 0.7 %
HCT: 33.7 % — ABNORMAL LOW (ref 40.0–52.0)
HGB: 11.5 g/dL — AB (ref 13.0–18.0)
LYMPHS ABS: 0.3 x10 3/mm — AB (ref 1.0–3.6)
LYMPHS PCT: 13.7 %
MCH: 33 pg (ref 26.0–34.0)
MCHC: 34.1 g/dL (ref 32.0–36.0)
MCV: 97 fL (ref 80–100)
MONOS PCT: 11.4 %
Monocyte #: 0.3 x10 3/mm (ref 0.2–1.0)
NEUTROS PCT: 73.9 %
Neutrophil #: 1.7 x10 3/mm (ref 1.4–6.5)
Platelet: 126 x10 3/mm — ABNORMAL LOW (ref 150–440)
RBC: 3.49 10*6/uL — ABNORMAL LOW (ref 4.40–5.90)
RDW: 15.8 % — ABNORMAL HIGH (ref 11.5–14.5)
WBC: 2.3 x10 3/mm — AB (ref 3.8–10.6)

## 2013-11-11 LAB — BASIC METABOLIC PANEL
Anion Gap: 9 (ref 7–16)
BUN: 22 mg/dL — ABNORMAL HIGH (ref 7–18)
CHLORIDE: 103 mmol/L (ref 98–107)
CO2: 28 mmol/L (ref 21–32)
Calcium, Total: 8.6 mg/dL (ref 8.5–10.1)
Creatinine: 0.88 mg/dL (ref 0.60–1.30)
EGFR (Non-African Amer.): >60
Glucose: 106 mg/dL — ABNORMAL HIGH (ref 65–99)
Osmolality: 283 (ref 275–301)
Potassium: 4 mmol/L (ref 3.5–5.1)
Sodium: 140 mmol/L (ref 136–145)

## 2013-11-15 DIAGNOSIS — Z85819 Personal history of malignant neoplasm of unspecified site of lip, oral cavity, and pharynx: Secondary | ICD-10-CM | POA: Diagnosis not present

## 2013-11-16 ENCOUNTER — Ambulatory Visit: Payer: Self-pay | Admitting: Oncology

## 2013-11-16 DIAGNOSIS — C7800 Secondary malignant neoplasm of unspecified lung: Secondary | ICD-10-CM | POA: Diagnosis not present

## 2013-11-16 DIAGNOSIS — F329 Major depressive disorder, single episode, unspecified: Secondary | ICD-10-CM | POA: Diagnosis not present

## 2013-11-16 DIAGNOSIS — H9190 Unspecified hearing loss, unspecified ear: Secondary | ICD-10-CM | POA: Diagnosis not present

## 2013-11-16 DIAGNOSIS — F411 Generalized anxiety disorder: Secondary | ICD-10-CM | POA: Diagnosis not present

## 2013-11-16 DIAGNOSIS — F17200 Nicotine dependence, unspecified, uncomplicated: Secondary | ICD-10-CM | POA: Diagnosis not present

## 2013-11-16 DIAGNOSIS — Z9221 Personal history of antineoplastic chemotherapy: Secondary | ICD-10-CM | POA: Diagnosis not present

## 2013-11-16 DIAGNOSIS — Z510 Encounter for antineoplastic radiation therapy: Secondary | ICD-10-CM | POA: Diagnosis not present

## 2013-11-16 DIAGNOSIS — C3490 Malignant neoplasm of unspecified part of unspecified bronchus or lung: Secondary | ICD-10-CM | POA: Diagnosis not present

## 2013-11-16 DIAGNOSIS — J449 Chronic obstructive pulmonary disease, unspecified: Secondary | ICD-10-CM | POA: Diagnosis not present

## 2013-11-16 DIAGNOSIS — Z85819 Personal history of malignant neoplasm of unspecified site of lip, oral cavity, and pharynx: Secondary | ICD-10-CM | POA: Diagnosis not present

## 2013-11-16 DIAGNOSIS — Z923 Personal history of irradiation: Secondary | ICD-10-CM | POA: Diagnosis not present

## 2013-11-16 DIAGNOSIS — R5383 Other fatigue: Secondary | ICD-10-CM | POA: Diagnosis not present

## 2013-11-16 DIAGNOSIS — C119 Malignant neoplasm of nasopharynx, unspecified: Secondary | ICD-10-CM | POA: Diagnosis not present

## 2013-11-16 DIAGNOSIS — R5381 Other malaise: Secondary | ICD-10-CM | POA: Diagnosis not present

## 2013-11-16 DIAGNOSIS — F3289 Other specified depressive episodes: Secondary | ICD-10-CM | POA: Diagnosis not present

## 2013-11-16 DIAGNOSIS — Z79899 Other long term (current) drug therapy: Secondary | ICD-10-CM | POA: Diagnosis not present

## 2013-11-17 DIAGNOSIS — Z85819 Personal history of malignant neoplasm of unspecified site of lip, oral cavity, and pharynx: Secondary | ICD-10-CM | POA: Diagnosis not present

## 2013-11-18 DIAGNOSIS — C119 Malignant neoplasm of nasopharynx, unspecified: Secondary | ICD-10-CM | POA: Diagnosis not present

## 2013-11-18 DIAGNOSIS — C7800 Secondary malignant neoplasm of unspecified lung: Secondary | ICD-10-CM | POA: Diagnosis not present

## 2013-11-18 DIAGNOSIS — Z85819 Personal history of malignant neoplasm of unspecified site of lip, oral cavity, and pharynx: Secondary | ICD-10-CM | POA: Diagnosis not present

## 2013-11-18 DIAGNOSIS — C3490 Malignant neoplasm of unspecified part of unspecified bronchus or lung: Secondary | ICD-10-CM | POA: Diagnosis not present

## 2013-11-18 LAB — CBC CANCER CENTER
BASOS ABS: 0 x10 3/mm (ref 0.0–0.1)
Basophil %: 0.2 %
EOS PCT: 0.7 %
Eosinophil #: 0 x10 3/mm (ref 0.0–0.7)
HCT: 32.8 % — ABNORMAL LOW (ref 40.0–52.0)
HGB: 11 g/dL — ABNORMAL LOW (ref 13.0–18.0)
LYMPHS ABS: 0.3 x10 3/mm — AB (ref 1.0–3.6)
LYMPHS PCT: 20.8 %
MCH: 33 pg (ref 26.0–34.0)
MCHC: 33.5 g/dL (ref 32.0–36.0)
MCV: 98 fL (ref 80–100)
MONOS PCT: 16.7 %
Monocyte #: 0.3 x10 3/mm (ref 0.2–1.0)
NEUTROS ABS: 1 x10 3/mm — AB (ref 1.4–6.5)
Neutrophil %: 61.6 %
Platelet: 155 x10 3/mm (ref 150–440)
RBC: 3.33 10*6/uL — AB (ref 4.40–5.90)
RDW: 15.8 % — ABNORMAL HIGH (ref 11.5–14.5)
WBC: 1.6 x10 3/mm — CL (ref 3.8–10.6)

## 2013-11-18 LAB — COMPREHENSIVE METABOLIC PANEL
ALT: 22 U/L
ANION GAP: 9 (ref 7–16)
Albumin: 3.5 g/dL (ref 3.4–5.0)
Alkaline Phosphatase: 70 U/L
BUN: 17 mg/dL (ref 7–18)
Bilirubin,Total: 0.3 mg/dL (ref 0.2–1.0)
CALCIUM: 8.6 mg/dL (ref 8.5–10.1)
CO2: 30 mmol/L (ref 21–32)
Chloride: 101 mmol/L (ref 98–107)
Creatinine: 0.91 mg/dL (ref 0.60–1.30)
EGFR (African American): >60
Glucose: 98 mg/dL (ref 65–99)
Osmolality: 281 (ref 275–301)
Potassium: 3.9 mmol/L (ref 3.5–5.1)
SGOT(AST): 9 U/L — ABNORMAL LOW (ref 15–37)
SODIUM: 140 mmol/L (ref 136–145)
Total Protein: 6.8 g/dL (ref 6.4–8.2)

## 2013-11-19 DIAGNOSIS — Z85819 Personal history of malignant neoplasm of unspecified site of lip, oral cavity, and pharynx: Secondary | ICD-10-CM | POA: Diagnosis not present

## 2013-11-21 ENCOUNTER — Emergency Department: Payer: Self-pay | Admitting: Emergency Medicine

## 2013-11-21 DIAGNOSIS — R079 Chest pain, unspecified: Secondary | ICD-10-CM | POA: Diagnosis not present

## 2013-11-21 DIAGNOSIS — R52 Pain, unspecified: Secondary | ICD-10-CM | POA: Diagnosis not present

## 2013-11-21 DIAGNOSIS — R091 Pleurisy: Secondary | ICD-10-CM | POA: Diagnosis not present

## 2013-11-21 DIAGNOSIS — J449 Chronic obstructive pulmonary disease, unspecified: Secondary | ICD-10-CM | POA: Diagnosis not present

## 2013-11-21 DIAGNOSIS — J9819 Other pulmonary collapse: Secondary | ICD-10-CM | POA: Diagnosis not present

## 2013-11-21 DIAGNOSIS — Z85118 Personal history of other malignant neoplasm of bronchus and lung: Secondary | ICD-10-CM | POA: Diagnosis not present

## 2013-11-21 DIAGNOSIS — Z87891 Personal history of nicotine dependence: Secondary | ICD-10-CM | POA: Diagnosis not present

## 2013-11-21 LAB — CBC
HCT: 31.4 % — ABNORMAL LOW (ref 40.0–52.0)
HGB: 10.6 g/dL — AB (ref 13.0–18.0)
MCH: 33.2 pg (ref 26.0–34.0)
MCHC: 33.9 g/dL (ref 32.0–36.0)
MCV: 98 fL (ref 80–100)
Platelet: 150 10*3/uL (ref 150–440)
RBC: 3.2 10*6/uL — AB (ref 4.40–5.90)
RDW: 16 % — ABNORMAL HIGH (ref 11.5–14.5)
WBC: 1.7 10*3/uL — AB (ref 3.8–10.6)

## 2013-11-21 LAB — COMPREHENSIVE METABOLIC PANEL
ALK PHOS: 59 U/L
AST: 12 U/L — AB (ref 15–37)
Albumin: 3.5 g/dL (ref 3.4–5.0)
Anion Gap: 7 (ref 7–16)
BUN: 17 mg/dL (ref 7–18)
Bilirubin,Total: 0.3 mg/dL (ref 0.2–1.0)
CALCIUM: 8.8 mg/dL (ref 8.5–10.1)
CHLORIDE: 106 mmol/L (ref 98–107)
CO2: 29 mmol/L (ref 21–32)
CREATININE: 0.76 mg/dL (ref 0.60–1.30)
EGFR (African American): >60
EGFR (Non-African Amer.): >60
Glucose: 101 mg/dL — ABNORMAL HIGH (ref 65–99)
Osmolality: 285 (ref 275–301)
Potassium: 3.8 mmol/L (ref 3.5–5.1)
SGPT (ALT): 20 U/L
SODIUM: 142 mmol/L (ref 136–145)
Total Protein: 6.7 g/dL (ref 6.4–8.2)

## 2013-11-21 LAB — TROPONIN I

## 2013-11-23 DIAGNOSIS — Z85819 Personal history of malignant neoplasm of unspecified site of lip, oral cavity, and pharynx: Secondary | ICD-10-CM | POA: Diagnosis not present

## 2013-11-24 DIAGNOSIS — Z85819 Personal history of malignant neoplasm of unspecified site of lip, oral cavity, and pharynx: Secondary | ICD-10-CM | POA: Diagnosis not present

## 2013-12-16 ENCOUNTER — Ambulatory Visit: Payer: Self-pay | Admitting: Oncology

## 2013-12-21 DIAGNOSIS — Z9221 Personal history of antineoplastic chemotherapy: Secondary | ICD-10-CM | POA: Diagnosis not present

## 2013-12-21 DIAGNOSIS — Z923 Personal history of irradiation: Secondary | ICD-10-CM | POA: Diagnosis not present

## 2013-12-21 DIAGNOSIS — Z85819 Personal history of malignant neoplasm of unspecified site of lip, oral cavity, and pharynx: Secondary | ICD-10-CM | POA: Diagnosis not present

## 2013-12-21 DIAGNOSIS — C3490 Malignant neoplasm of unspecified part of unspecified bronchus or lung: Secondary | ICD-10-CM | POA: Diagnosis not present

## 2013-12-21 LAB — CBC CANCER CENTER
BASOS ABS: 0 x10 3/mm (ref 0.0–0.1)
Basophil %: 0.5 %
EOS PCT: 2.7 %
Eosinophil #: 0.1 x10 3/mm (ref 0.0–0.7)
HCT: 38.9 % — ABNORMAL LOW (ref 40.0–52.0)
HGB: 12.6 g/dL — AB (ref 13.0–18.0)
Lymphocyte #: 0.6 x10 3/mm — ABNORMAL LOW (ref 1.0–3.6)
Lymphocyte %: 13.9 %
MCH: 32.2 pg (ref 26.0–34.0)
MCHC: 32.5 g/dL (ref 32.0–36.0)
MCV: 99 fL (ref 80–100)
Monocyte #: 0.6 x10 3/mm (ref 0.2–1.0)
Monocyte %: 14.9 %
NEUTROS ABS: 2.9 x10 3/mm (ref 1.4–6.5)
Neutrophil %: 68 %
PLATELETS: 252 x10 3/mm (ref 150–440)
RBC: 3.92 10*6/uL — ABNORMAL LOW (ref 4.40–5.90)
RDW: 17.8 % — ABNORMAL HIGH (ref 11.5–14.5)
WBC: 4.3 x10 3/mm (ref 3.8–10.6)

## 2013-12-21 LAB — BASIC METABOLIC PANEL
ANION GAP: 8 (ref 7–16)
BUN: 18 mg/dL (ref 7–18)
CHLORIDE: 103 mmol/L (ref 98–107)
Calcium, Total: 9.1 mg/dL (ref 8.5–10.1)
Co2: 29 mmol/L (ref 21–32)
Creatinine: 0.95 mg/dL (ref 0.60–1.30)
Glucose: 104 mg/dL — ABNORMAL HIGH (ref 65–99)
OSMOLALITY: 282 (ref 275–301)
POTASSIUM: 4 mmol/L (ref 3.5–5.1)
SODIUM: 140 mmol/L (ref 136–145)

## 2014-01-16 ENCOUNTER — Ambulatory Visit: Payer: Self-pay | Admitting: Oncology

## 2014-01-16 DIAGNOSIS — C3490 Malignant neoplasm of unspecified part of unspecified bronchus or lung: Secondary | ICD-10-CM | POA: Diagnosis not present

## 2014-01-16 DIAGNOSIS — Z9221 Personal history of antineoplastic chemotherapy: Secondary | ICD-10-CM | POA: Diagnosis not present

## 2014-01-16 DIAGNOSIS — Z452 Encounter for adjustment and management of vascular access device: Secondary | ICD-10-CM | POA: Diagnosis not present

## 2014-02-15 ENCOUNTER — Ambulatory Visit: Payer: Self-pay | Admitting: Oncology

## 2014-02-15 DIAGNOSIS — Z85819 Personal history of malignant neoplasm of unspecified site of lip, oral cavity, and pharynx: Secondary | ICD-10-CM | POA: Diagnosis not present

## 2014-02-15 DIAGNOSIS — Z452 Encounter for adjustment and management of vascular access device: Secondary | ICD-10-CM | POA: Diagnosis not present

## 2014-02-15 DIAGNOSIS — Z923 Personal history of irradiation: Secondary | ICD-10-CM | POA: Diagnosis not present

## 2014-02-15 DIAGNOSIS — C3490 Malignant neoplasm of unspecified part of unspecified bronchus or lung: Secondary | ICD-10-CM | POA: Diagnosis not present

## 2014-02-15 DIAGNOSIS — Z9221 Personal history of antineoplastic chemotherapy: Secondary | ICD-10-CM | POA: Diagnosis not present

## 2014-03-18 ENCOUNTER — Ambulatory Visit: Payer: Self-pay | Admitting: Oncology

## 2014-04-22 ENCOUNTER — Ambulatory Visit: Payer: Self-pay | Admitting: Oncology

## 2014-04-22 DIAGNOSIS — Z85819 Personal history of malignant neoplasm of unspecified site of lip, oral cavity, and pharynx: Secondary | ICD-10-CM | POA: Diagnosis not present

## 2014-04-22 DIAGNOSIS — Z923 Personal history of irradiation: Secondary | ICD-10-CM | POA: Diagnosis not present

## 2014-04-22 DIAGNOSIS — Z9221 Personal history of antineoplastic chemotherapy: Secondary | ICD-10-CM | POA: Diagnosis not present

## 2014-04-22 DIAGNOSIS — Z452 Encounter for adjustment and management of vascular access device: Secondary | ICD-10-CM | POA: Diagnosis not present

## 2014-04-22 DIAGNOSIS — C3490 Malignant neoplasm of unspecified part of unspecified bronchus or lung: Secondary | ICD-10-CM | POA: Diagnosis not present

## 2014-05-17 ENCOUNTER — Ambulatory Visit
Admit: 2014-05-17 | Disposition: A | Discharge status: Home / Self Care | Payer: Self-pay | Attending: Oncology | Admitting: Oncology

## 2014-05-23 DIAGNOSIS — Z85818 Personal history of malignant neoplasm of other sites of lip, oral cavity, and pharynx: Secondary | ICD-10-CM | POA: Diagnosis not present

## 2014-05-23 DIAGNOSIS — C3490 Malignant neoplasm of unspecified part of unspecified bronchus or lung: Secondary | ICD-10-CM | POA: Diagnosis not present

## 2014-05-23 DIAGNOSIS — Z923 Personal history of irradiation: Secondary | ICD-10-CM | POA: Diagnosis not present

## 2014-05-23 DIAGNOSIS — Z9221 Personal history of antineoplastic chemotherapy: Secondary | ICD-10-CM | POA: Diagnosis not present

## 2014-06-06 DIAGNOSIS — C3490 Malignant neoplasm of unspecified part of unspecified bronchus or lung: Secondary | ICD-10-CM | POA: Diagnosis not present

## 2014-06-17 ENCOUNTER — Ambulatory Visit
Admit: 2014-06-17 | Disposition: A | Discharge status: Home / Self Care | Payer: Self-pay | Attending: Oncology | Admitting: Oncology

## 2014-07-01 ENCOUNTER — Other Ambulatory Visit: Payer: Self-pay | Admitting: Oncology

## 2014-07-01 DIAGNOSIS — C3490 Malignant neoplasm of unspecified part of unspecified bronchus or lung: Secondary | ICD-10-CM

## 2014-07-09 NOTE — Consult Note (Signed)
Reason for Visit: This 59 year old Male patient presents to the clinic for initial evaluation of  lung cancer .   Referred by Dr. Oliva Bustard.  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 He been followed closely and back in early 2015 was noted on CT scan of the chest having abnormal mediastinal adenopathy. He underwent endoscopic proximal bronchial ultrasound which was positive for poorly differentiated carcinoma. Cannot differentiate a lung primary versus metastasis from his original nasopharyngeal tumor. He was started on induction chemotherapy with carboplatinum and Taxol in April of 2015. He had a repeat PET/CT scan shows excellent resolution of disease in his chest with approximately 75% response. Performance status wise he is doing fairly well. 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 His weight has been fairly stable.he is seen today for radiation oncology opinion  Past Hx:    Cancer, Throat:    HOH, Hard of Hearing:    Tobacco Use:    Depression:    Anxiety:    COPD:    Recovering alcoholic- Feb. 1610:    Tonsillectomy:   Past, Family and Social History:  Past Medical History positive   Respiratory bronchitis; COPD; pneumonia   Cancer  head and neck cancer, nasopharyngeal carcinoma status post concurrent chemoradiation 3 years prior   Neurological/Psychiatric anxiety; depression; hard of hearing   Past Surgical History tonsillectomy   Family History noncontributory   Social History positive   Social History Comments greater than 50-pack-year smoking history also recovering alcoholic   Additional Past Medical and Surgical History accompanied by his wife today   Allergies:   No Known Allergies:   Home Meds:  Home Medications: Medication Instructions Status  ondansetron 4 mg oral tablet 1 tab(s) orally every 6 hours chemotherapy induced nausea and vomiting  Active  traZODone 50 mg oral tablet 1 tab(s) orally once a day prn  at bed time Active  temazepam 30 mg oral capsule 1 cap(s) orally once a day (at bedtime) x 60 days  Active  Symbicort 160 mcg-4.5 mcg/inh inhalation aerosol 1 puff(s) inhaled once a day Active  traMADol 50 mg oral tablet  1 tablet orally every 6 hours as needed for pain Active   Review of Systems:  General negative   Performance Status (ECOG) 0   Skin negative   Breast negative   Ophthalmologic negative   ENMT see HPI   Respiratory and Thorax see HPI   Cardiovascular negative   Gastrointestinal negative   Genitourinary negative   Musculoskeletal negative   Neurological negative   Psychiatric negative   Hematology/Lymphatics negative   Endocrine negative   Allergic/Immunologic negative   Review of Systems   review of systems obtained from nurses notes  Nursing Notes:  Nursing Vital Signs and Chemo Nursing Nursing Notes: *CC Vital Signs Flowsheet:   01-Jul-15 11:44  Temp Temperature  97  Pulse Pulse 84  Respirations Respirations 21  SBP SBP 144  DBP DBP 84  Current Weight (kg) (kg) 76.7   Physical Exam:  General/Skin/HEENT:  General normal   Skin normal   Eyes normal   ENMT normal   Head and Neck normal   Additional PE well-developed male in NAD.  Oral cavity is clear no oral mucosal lesions are identified. No cervical or supraclavicular adenopathy is identified lungs are clear to A&P cardiac examination shows regular rate and rhythm. Abdomen is benign with no organomegaly or masses noted.   Breasts/Resp/CV/GI/GU:  Respiratory and Thorax normal   Cardiovascular normal   Gastrointestinal normal   Genitourinary normal   MS/Neuro/Psych/Lymph:  Musculoskeletal normal   Neurological normal   Lymphatics normal   Other Results:  Radiology Results: LabUnknown:    24-Mar-15 15:14, CT Chest With Contrast  PACS Image     09-Jun-15 16:36, MRI Brain  With/Without Contrast  PACS Image   MRI:  MRI Brain  With/Without Contrast   REASON FOR EXAM:    Lung CA Confusion  COMMENTS:       PROCEDURE: MR  - MR BRAIN WO/W CONTRAST  - Aug 24 2013  4:36PM     CLINICAL DATA:  Lung cancer.  Confusion.    EXAM:  MRI HEAD WITHOUT AND WITH CONTRAST    TECHNIQUE:  Multiplanar, multiecho pulse sequences of the brain and surrounding  structures were obtained without and with intravenous contrast.    CONTRAST:  15 mL MultiHance IV  COMPARISON:  None.    FINDINGS:  Ventricle size is normal. Negative for acute infarct. No significant  chronicischemia. Pituitary normal in size.    Negative for hemorrhage or mass.  No edema.    Postcontrast images are degraded by mild motion. Allowing for this,  no enhancing mass lesion identified.     IMPRESSION:  Negative for metastatic disease. No acuteabnormality. Normal for  age examination.  Electronically Signed    By: Franchot Gallo M.D.    On: 08/24/2013 16:52         Verified By: Truett Perna, M.D.,  CT:    24-Mar-15 15:14, CT Chest With Contrast  CT Chest With Contrast   REASON FOR EXAM:    lung nodule fever abn chest xray  COMMENTS:       PROCEDURE: KCT - KCT CHEST WITH CONTRAST  - Jun 08 2013  3:14PM     CLINICAL DATA:  Cough and fever. Lung nodule. Abnormal chest  radiograph.  History of nasopharyngeal squamous cell carcinoma.    EXAM:  CT CHEST WITH CONTRAST    TECHNIQUE:  Multidetector CT imaging of the chest was performed during  intravenous contrast administration.  CONTRAST:  75 cc Isovue 370.    COMPARISON:  DG CHEST 2V dated 06/02/2013. CT images from PET  06/11/2011 are not available for comparison.    FINDINGS:  Bulky mediastinal adenopathy measures up to 2.7 x 3.3 cm in the  lower right paratracheal station. Left suprahilar adenopathy  measures up to 1.5 cm in short axis. No axillary adenopathy. Heart  size normal. No pericardial effusion.    Biapical pleural parenchymal scarring. A spiculated nodular lesion  in the left upper lobe measures 9 x 9 mm (image 19). 4 mm left upper  lobe nodule (image 28). Vague areas of peribronchovascular  ground-glass in the right lung, nonspecific. Scattered areas of mild  bronchiectasis No pleural fluid. Airway is otherwise unremarkable.  Incidental imaging of the upper abdomen shows an irregular liver  contour. Visualized portions of the liver are otherwise  unremarkable. A 1.1 x 1.4 cm fat density lesion in the right adrenal  gland. Visualized portions of the left adrenal gland, kidneys,  spleen, pancreas, stomach and bowel are grossly unremarkable. No  worrisome lytic or sclerotic lesions.     IMPRESSION:  1. Bulky mediastinal and left suprahilar adenopathy, worrisome for  small cell carcinoma. The primary lesion may be in the left upper  lobe. These results will be called to the ordering clinician or  representative by the Radiologist Assistant, and communication  documented in the PACS Dashboard.  2. Additional scattered areas of peribronchovascular ground-glass  nodularity are nonspecific. Continued attention on followup exams is  warranted.  3. Irregular liver contour is consistent withcirrhosis.  4. Right adrenal adenoma.      Electronically Signed    By: Lorin Picket M.D.    On:  06/09/2013 10:30         Verified By: Luretha Rued, M.D.,   Relevent Results:   Relevant Scans and Labs C.T. scans and PET CT scans and MRI of brain are all reviewed   Assessment and Plan: Impression:   59 year old male with history of nasopharyngeal carcinoma now with poorly differentiated carcinoma involving mediastinal nodes will treat as a stage IIIB (T1, N3, M0). Status post induction chemotherapy with good response now for concurrent chemoradiation with curative intent. Plan:   at this summer to go ahead with concurrent chemoradiation. We'll plan on delivering 6000 cGy to his mediastinum targeting areas of known tumor and followed by initial CT scan and PET CT criteria. Based on her significant COPD I am concerned about lung toxicity as well as cardiac toxicity from radiation therapy.Will attempt IM RT treatment planning and delivery to minimize dose to normal lung volume as well as his heart and treat up to 6000 cGy with concurrent chemotherapy. Risks and benefits of treatment including dysphasia, alteration of blood counts, fatigue, loss of normal lung volume, and he radiation of the heart all were discussed in detail with the patient and his wife. Both seem to comprehend my treatment plan well. I set the patient up for CT simulation early next week. Will coordinate chemotherapy with medical oncology.  I would like to take this opportunity for allowing me to participate in the care of your patient..  CC Referral:  cc: Dr. Rebecka Apley   Electronic Signatures: Baruch Gouty, Roda Shutters (MD)  (Signed 01-Jul-15 13:26)  Authored: HPI, Diagnosis, Past Hx, PFSH, Allergies, Home Meds, ROS, Nursing Notes, Physical Exam, Other Results, Relevent Results, Encounter Assessment and Plan, CC Referring Physician   Last Updated: 01-Jul-15 13:26 by Armstead Peaks (MD)

## 2014-07-09 NOTE — Op Note (Signed)
PATIENT NAME:  Jason Bryan, Jason Bryan MR#:  [id number] DATE OF BIRTH:  [birth year]  DATE OF PROCEDURE:  07/05/2013  PREOPERATIVE DIAGNOSIS: Squamous cell carcinoma, recurrent, mediastinal nodes.   POSTOPERATIVE DIAGNOSIS: Squamous cell carcinoma, recurrent, mediastinal nodes.  OPERATION: Insertion of venous access port with fluoroscopic and ultrasound guidance.   SURGEON: Mckinley Jewel, M.D.   ANESTHESIA: Local with anesthesia monitoring.   COMPLICATIONS: None.   ESTIMATED BLOOD LOSS: Minimal.   DESCRIPTION OF PROCEDURE: The patient was placed in the supine position on the operating room table. The left upper chest and neck area were prepped and draped out as a sterile field. With adequate sedation and monitoring, the ultrasound probe was brought up to the field.  Timeout was performed. The subclavian vein was acquired and it was noted to be fairly small, was somewhat difficult to discern, beneath the lateral infraclavicular region. Accordingly, the internal jugular vein was chosen. This vein was easily identified and local anesthetic of 0.5% Marcaine with 1% Xylocaine was instilled. A small stab incision was made in the skin along the skin crease in the neck, and the needle was positioned in the vein successfully with free withdrawal of blood. A guidewire was positioned and following a Seldinger technique a catheter was positioned going into the superior vena cava and right atrial junction. The skin mark was about 21 to 22 cm. A subcutaneous pocket was then created over the second costal cartilage and interspace. Skin was infiltrated with local anesthetic. The intervening space between the 2 incisions was also infiltrated with local anesthetic. A skin incision was made over the port site. The skin and subcutaneous tissue was elevated to create a nice adequate pocket. The catheter was tunneled through to this site, cut to approximate length, and affixed to a prefilled port. The port was placed in the pocket  and anchored to the underlying fascia with 3 stitches of 2-0 Prolene and was then flushed through with heparinized saline. Fluoroscopy was utilized for proper positioning of the catheter. The subcutaneous tissue was closed with 3-0 Vicryl and the skin closed with subcuticular 4-0 Vicryl covered with Dermabond. The procedure was well tolerated. He was subsequently returned to the recovery room in stable condition    ____________________________ S.Robinette Haines, MD sgs:sb D: 07/05/2013 10:59:31 ET T: 07/05/2013 11:15:51 ET JOB#: [id number]  cc: S.G. Jamal Collin, MD, <Dictator> [facility] Robinette Haines MD ELECTRONICALLY SIGNED 07/06/2013 11:29

## 2014-07-18 ENCOUNTER — Telehealth: Payer: Self-pay | Admitting: *Deleted

## 2014-07-18 NOTE — Telephone Encounter (Signed)
Called for a refill, but has refills until 12/13/14

## 2014-08-24 ENCOUNTER — Inpatient Hospital Stay
Admission: RE | Admit: 2014-08-24 | Discharge: 2014-08-24 | Disposition: A | Discharge status: Home / Self Care | Payer: Self-pay | Source: Ambulatory Visit | Attending: Oncology | Admitting: Oncology

## 2014-08-24 ENCOUNTER — Ambulatory Visit
Admission: RE | Admit: 2014-08-24 | Discharge: 2014-08-24 | Disposition: A | Discharge status: Home / Self Care | Payer: Medicare Other | Source: Ambulatory Visit | Attending: Oncology | Admitting: Oncology

## 2014-08-24 DIAGNOSIS — I7090 Unspecified atherosclerosis: Secondary | ICD-10-CM | POA: Diagnosis not present

## 2014-08-24 DIAGNOSIS — J984 Other disorders of lung: Secondary | ICD-10-CM | POA: Diagnosis not present

## 2014-08-24 DIAGNOSIS — K1123 Chronic sialoadenitis: Secondary | ICD-10-CM | POA: Diagnosis not present

## 2014-08-24 DIAGNOSIS — R220 Localized swelling, mass and lump, head: Secondary | ICD-10-CM | POA: Insufficient documentation

## 2014-08-24 DIAGNOSIS — C3490 Malignant neoplasm of unspecified part of unspecified bronchus or lung: Secondary | ICD-10-CM

## 2014-08-24 DIAGNOSIS — R590 Localized enlarged lymph nodes: Secondary | ICD-10-CM | POA: Insufficient documentation

## 2014-08-24 DIAGNOSIS — C760 Malignant neoplasm of head, face and neck: Secondary | ICD-10-CM | POA: Diagnosis not present

## 2014-08-24 DIAGNOSIS — D1779 Benign lipomatous neoplasm of other sites: Secondary | ICD-10-CM | POA: Insufficient documentation

## 2014-08-24 DIAGNOSIS — R599 Enlarged lymph nodes, unspecified: Secondary | ICD-10-CM | POA: Diagnosis not present

## 2014-08-24 DIAGNOSIS — R229 Localized swelling, mass and lump, unspecified: Secondary | ICD-10-CM | POA: Diagnosis not present

## 2014-08-24 HISTORY — DX: Malignant neoplasm of unspecified part of unspecified bronchus or lung: C34.90

## 2014-08-24 MED ORDER — IOHEXOL 350 MG/ML SOLN
75.0000 mL | Freq: Once | INTRAVENOUS | Status: AC | PRN
Start: 2014-08-24 — End: 2014-08-24
  Administered 2014-08-24: 75 mL via INTRAVENOUS

## 2014-08-26 ENCOUNTER — Inpatient Hospital Stay: Payer: Medicare Other | Attending: Oncology

## 2014-08-26 DIAGNOSIS — C801 Malignant (primary) neoplasm, unspecified: Secondary | ICD-10-CM

## 2014-08-26 DIAGNOSIS — F329 Major depressive disorder, single episode, unspecified: Secondary | ICD-10-CM | POA: Diagnosis not present

## 2014-08-26 DIAGNOSIS — H9190 Unspecified hearing loss, unspecified ear: Secondary | ICD-10-CM | POA: Insufficient documentation

## 2014-08-26 DIAGNOSIS — K149 Disease of tongue, unspecified: Secondary | ICD-10-CM | POA: Diagnosis not present

## 2014-08-26 DIAGNOSIS — Z79899 Other long term (current) drug therapy: Secondary | ICD-10-CM | POA: Insufficient documentation

## 2014-08-26 DIAGNOSIS — J449 Chronic obstructive pulmonary disease, unspecified: Secondary | ICD-10-CM | POA: Insufficient documentation

## 2014-08-26 DIAGNOSIS — F1021 Alcohol dependence, in remission: Secondary | ICD-10-CM | POA: Diagnosis not present

## 2014-08-26 DIAGNOSIS — Z8589 Personal history of malignant neoplasm of other organs and systems: Secondary | ICD-10-CM | POA: Insufficient documentation

## 2014-08-26 DIAGNOSIS — Z452 Encounter for adjustment and management of vascular access device: Secondary | ICD-10-CM | POA: Insufficient documentation

## 2014-08-26 DIAGNOSIS — F419 Anxiety disorder, unspecified: Secondary | ICD-10-CM | POA: Diagnosis not present

## 2014-08-26 DIAGNOSIS — F17210 Nicotine dependence, cigarettes, uncomplicated: Secondary | ICD-10-CM | POA: Diagnosis not present

## 2014-08-26 DIAGNOSIS — C3491 Malignant neoplasm of unspecified part of right bronchus or lung: Secondary | ICD-10-CM | POA: Insufficient documentation

## 2014-08-26 DIAGNOSIS — C779 Secondary and unspecified malignant neoplasm of lymph node, unspecified: Secondary | ICD-10-CM | POA: Insufficient documentation

## 2014-08-26 MED ORDER — HEPARIN SOD (PORK) LOCK FLUSH 100 UNIT/ML IV SOLN
500.0000 [IU] | Freq: Once | INTRAVENOUS | Status: AC
  Administered 2014-08-26: 500 [IU] via INTRAVENOUS
  Filled 2014-08-26: qty 5

## 2014-08-26 MED ORDER — SODIUM CHLORIDE 0.9 % IJ SOLN
10.0000 mL | Freq: Once | INTRAMUSCULAR | Status: AC
  Administered 2014-08-26: 10 mL via INTRAVENOUS
  Filled 2014-08-26: qty 10

## 2014-09-07 ENCOUNTER — Inpatient Hospital Stay (HOSPITAL_BASED_OUTPATIENT_CLINIC_OR_DEPARTMENT_OTHER): Payer: Medicare Other | Admitting: Oncology

## 2014-09-07 ENCOUNTER — Other Ambulatory Visit: Payer: Self-pay | Admitting: *Deleted

## 2014-09-07 ENCOUNTER — Inpatient Hospital Stay: Payer: Medicare Other

## 2014-09-07 VITALS — BP 112/71 | HR 116 | Temp 98.0°F | Wt 197.3 lb

## 2014-09-07 DIAGNOSIS — C3491 Malignant neoplasm of unspecified part of right bronchus or lung: Secondary | ICD-10-CM

## 2014-09-07 DIAGNOSIS — C3490 Malignant neoplasm of unspecified part of unspecified bronchus or lung: Secondary | ICD-10-CM

## 2014-09-07 DIAGNOSIS — K219 Gastro-esophageal reflux disease without esophagitis: Secondary | ICD-10-CM

## 2014-09-07 DIAGNOSIS — Z79899 Other long term (current) drug therapy: Secondary | ICD-10-CM

## 2014-09-07 DIAGNOSIS — C779 Secondary and unspecified malignant neoplasm of lymph node, unspecified: Secondary | ICD-10-CM

## 2014-09-07 DIAGNOSIS — F1021 Alcohol dependence, in remission: Secondary | ICD-10-CM

## 2014-09-07 DIAGNOSIS — F419 Anxiety disorder, unspecified: Secondary | ICD-10-CM

## 2014-09-07 DIAGNOSIS — F17210 Nicotine dependence, cigarettes, uncomplicated: Secondary | ICD-10-CM

## 2014-09-07 DIAGNOSIS — Z452 Encounter for adjustment and management of vascular access device: Secondary | ICD-10-CM

## 2014-09-07 DIAGNOSIS — J449 Chronic obstructive pulmonary disease, unspecified: Secondary | ICD-10-CM

## 2014-09-07 DIAGNOSIS — F329 Major depressive disorder, single episode, unspecified: Secondary | ICD-10-CM

## 2014-09-07 DIAGNOSIS — H9190 Unspecified hearing loss, unspecified ear: Secondary | ICD-10-CM

## 2014-09-07 DIAGNOSIS — Z8589 Personal history of malignant neoplasm of other organs and systems: Secondary | ICD-10-CM

## 2014-09-07 LAB — CBC WITH DIFFERENTIAL/PLATELET
BASOS ABS: 0 10*3/uL (ref 0–0.1)
Basophils Relative: 1 %
Eosinophils Absolute: 0.1 10*3/uL (ref 0–0.7)
Eosinophils Relative: 2 %
HEMATOCRIT: 44.2 % (ref 40.0–52.0)
HEMOGLOBIN: 14.9 g/dL (ref 13.0–18.0)
Lymphocytes Relative: 16 %
Lymphs Abs: 1 10*3/uL (ref 1.0–3.6)
MCH: 29.7 pg (ref 26.0–34.0)
MCHC: 33.7 g/dL (ref 32.0–36.0)
MCV: 88.2 fL (ref 80.0–100.0)
MONO ABS: 0.4 10*3/uL (ref 0.2–1.0)
MONOS PCT: 7 %
NEUTROS ABS: 4.5 10*3/uL (ref 1.4–6.5)
Neutrophils Relative %: 74 %
Platelets: 254 10*3/uL (ref 150–440)
RBC: 5.01 MIL/uL (ref 4.40–5.90)
RDW: 16.3 % — ABNORMAL HIGH (ref 11.5–14.5)
WBC: 6 10*3/uL (ref 3.8–10.6)

## 2014-09-07 LAB — COMPREHENSIVE METABOLIC PANEL
ALBUMIN: 4.2 g/dL (ref 3.5–5.0)
ALK PHOS: 63 U/L (ref 38–126)
ALT: 19 U/L (ref 17–63)
ANION GAP: 7 (ref 5–15)
AST: 22 U/L (ref 15–41)
BUN: 19 mg/dL (ref 6–20)
CALCIUM: 8.6 mg/dL — AB (ref 8.9–10.3)
CO2: 27 mmol/L (ref 22–32)
Chloride: 102 mmol/L (ref 101–111)
Creatinine, Ser: 0.95 mg/dL (ref 0.61–1.24)
GFR calc non Af Amer: >60 mL/min (ref >60)
Glucose, Bld: 188 mg/dL — ABNORMAL HIGH (ref 65–99)
Potassium: 3.8 mmol/L (ref 3.5–5.1)
Sodium: 136 mmol/L (ref 135–145)
TOTAL PROTEIN: 7 g/dL (ref 6.5–8.1)
Total Bilirubin: 0.5 mg/dL (ref 0.3–1.2)

## 2014-09-07 MED ORDER — BUDESONIDE-FORMOTEROL FUMARATE 160-4.5 MCG/ACT IN AERO: 1.0000 | INHALATION_SPRAY | Freq: Every day | RESPIRATORY_TRACT | Status: DC

## 2014-09-07 NOTE — Progress Notes (Signed)
Patient does not have living will.  Former smoker,

## 2014-09-08 NOTE — Progress Notes (Signed)
[place] @ [facility] [phone]  Fax:([phone]) [place]: 1955-06-15  MR#: [id number]  DDU#:[id number]  Patient Care Team: Provider Not In System as PCP - General Seeplaputhur Robinette Haines, MD (General Surgery)  CHIEF COMPLAINT:  Chief Complaint  Patient presents with  . Follow-up   59 year old gentleman with a history of head and neck cancer and lung cancer came for further follow-up and review of CT scan   INTERVAL HISTORY:  Exterior old gentleman came today further follow-up since last evaluation patient did not have any shortness of breath cough.  He does not smoke.  No chest pain or hemoptysis.  No fever.  Patient had a CT scan which has been reviewed independently REVIEW OF SYSTEMS:   GENERAL:  Feels good.  Active.  No fevers, sweats or weight loss. PERFORMANCE STATUS (ECOG): 01 HEENT:  No visual changes, runny nose, sore throat, mouth sores or tenderness. Lungs: No shortness of breath or cough.  No hemoptysis. Cardiac:  No chest pain, palpitations, orthopnea, or PND. GI:  No nausea, vomiting, diarrhea, constipation, melena or hematochezia. GU:  No urgency, frequency, dysuria, or hematuria. Musculoskeletal:  No back pain.  No joint pain.  No muscle tenderness. Extremities:  No pain or swelling. Skin:  No rashes or skin changes. Neuro:  No headache, numbness or weakness, balance or coordination issues. Endocrine:  No diabetes, thyroid issues, hot flashes or night sweats. Psych:  No mood changes, depression or anxiety. Pain:  No focal pain. Review of systems:  All other systems reviewed and found to be negative. As per HPI. Otherwise, a complete review of systems is negatve.  PAST MEDICAL HISTORY: Past Medical History  Diagnosis Date  . Cancer 2011    squemous stage 3  . Squamous cell lung cancer 2015    Right Lung CA with chemo + rad tx's.    PAST SURGICAL HISTORY: No past surgical history on file.  FAMILY HISTORY Family History    Problem Relation Age of Onset  . Cancer Mother     ADVANCED DIRECTIVES:  No flowsheet data found.  HEALTH MAINTENANCE: History  Substance Use Topics  . Smoking status: Former Smoker -- 1.00 packs/day for 40 years    Types: Cigarettes  . Smokeless tobacco: Never Used  . Alcohol Use: No   Significant History/PMH:   Cancer, Throat:    HOH, Hard of Hearing:    Tobacco Use:    Depression:    Anxiety:    COPD:    Recovering alcoholic- Feb. 2376:    Tonsillectomy:   Smoking History: Smoking History 7-8 Cigarettes per day. Smoking Cessation Information Given to Patient .  PFSH: Family History: noncontributory  Comments: No family history of colorectal cancer, breast cancer, or ovarian cancer.  Social History: negative alcohol, positive tobacco  Smoker: Smoking cessation counseling performed  Smoker- Packs Per Day: One pack per day  Smoker- How Many Years: More than 40 years  Comments: Patient has been alcohol free since February of 2011.  Additional Past Medical and Surgical History: History of COPD.  And multiple admissions due to pneumonia and acute bronchitis.     No Known Allergies  Current Outpatient Prescriptions  Medication Sig Dispense Refill  . temazepam (RESTORIL) 30 MG capsule Take 1 capsule by mouth daily.    . traZODone (DESYREL) 50 MG tablet Take 1 tablet by mouth as needed.    . budesonide-formoterol (SYMBICORT) 160-4.5 MCG/ACT inhaler Inhale 1 puff into the lungs daily. 1  Inhaler 3   No current facility-administered medications for this visit.    OBJECTIVE:  Filed Vitals:   09/07/14 1050  BP: 112/71  Pulse: 116  Temp: 98 F (36.7 C)     Body mass index is 31.86 kg/(m^2).    ECOG FS:1 - Symptomatic but completely ambulatory  PHYSICAL EXAM:  GENERAL:  Well developed, well nourished, sitting comfortably in the exam room in no acute distress. MENTAL STATUS:  Alert and oriented to person, place and time. HEAD: Normocephalic, atraumatic,  face symmetric, no Cushingoid features. EYES:  Pupils equal round and reactive to light and accomodation.  No conjunctivitis or scleral icterus. ENT:  Oropharynx clear without lesion.  Tongue normal. Mucous membranes moist.  RESPIRATORY:  Clear to auscultation without rales, wheezes or rhonchi. CARDIOVASCULAR:  Regular rate and rhythm without murmur, rub or gallop.  ABDOMEN:  Soft, non-tender, with active bowel sounds, and no hepatosplenomegaly.  No masses. BACK:  No CVA tenderness.  No tenderness on percussion of the back or rib cage. SKIN:  No rashes, ulcers or lesions. EXTREMITIES: No edema, no skin discoloration or tenderness.  No palpable cords. LYMPH NODES: No palpable cervical, supraclavicular, axillary or inguinal adenopathy  NEUROLOGICAL: Unremarkable. PSYCH:  Appropriate.  LAB RESULTS:  Appointment on 09/07/2014  Component Date Value Ref Range Status  . WBC 09/07/2014 6.0  3.8 - 10.6 K/uL Final  . RBC 09/07/2014 5.01  4.40 - 5.90 MIL/uL Final  . Hemoglobin 09/07/2014 14.9  13.0 - 18.0 g/dL Final  . HCT 09/07/2014 44.2  40.0 - 52.0 % Final  . MCV 09/07/2014 88.2  80.0 - 100.0 fL Final  . MCH 09/07/2014 29.7  26.0 - 34.0 pg Final  . MCHC 09/07/2014 33.7  32.0 - 36.0 g/dL Final  . RDW 09/07/2014 16.3* 11.5 - 14.5 % Final  . Platelets 09/07/2014 254  150 - 440 K/uL Final  . Neutrophils Relative % 09/07/2014 74   Final  . Neutro Abs 09/07/2014 4.5  1.4 - 6.5 K/uL Final  . Lymphocytes Relative 09/07/2014 16   Final  . Lymphs Abs 09/07/2014 1.0  1.0 - 3.6 K/uL Final  . Monocytes Relative 09/07/2014 7   Final  . Monocytes Absolute 09/07/2014 0.4  0.2 - 1.0 K/uL Final  . Eosinophils Relative 09/07/2014 2   Final  . Eosinophils Absolute 09/07/2014 0.1  0 - 0.7 K/uL Final  . Basophils Relative 09/07/2014 1   Final  . Basophils Absolute 09/07/2014 0.0  0 - 0.1 K/uL Final  . Sodium 09/07/2014 136  135 - 145 mmol/L Final  . Potassium 09/07/2014 3.8  3.5 - 5.1 mmol/L Final  .  Chloride 09/07/2014 102  101 - 111 mmol/L Final  . CO2 09/07/2014 27  22 - 32 mmol/L Final  . Glucose, Bld 09/07/2014 188* 65 - 99 mg/dL Final  . BUN 09/07/2014 19  6 - 20 mg/dL Final  . Creatinine, Ser 09/07/2014 0.95  0.61 - 1.24 mg/dL Final  . Calcium 09/07/2014 8.6* 8.9 - 10.3 mg/dL Final  . Total Protein 09/07/2014 7.0  6.5 - 8.1 g/dL Final  . Albumin 09/07/2014 4.2  3.5 - 5.0 g/dL Final  . AST 09/07/2014 22  15 - 41 U/L Final  . ALT 09/07/2014 19  17 - 63 U/L Final  . Alkaline Phosphatase 09/07/2014 63  38 - 126 U/L Final  . Total Bilirubin 09/07/2014 0.5  0.3 - 1.2 mg/dL Final  . GFR calc non Af Amer 09/07/2014 >60  >60 mL/min Final  .  GFR calc Af Amer 09/07/2014 >60  >60 mL/min Final   Comment: (NOTE) The eGFR has been calculated using the CKD EPI equation. This calculation has not been validated in all clinical situations. eGFR's persistently <60 mL/min signify possible Chronic Kidney Disease.   . Anion gap 09/07/2014 7  5 - 15 Final      STUDIES: Ct Head W Wo Contrast  08/24/2014   CLINICAL DATA:  Restaging head and neck squamous cell cancer following chemotherapy and radiation. Currently asymptomatic.  EXAM: CT HEAD WITHOUT AND WITH CONTRAST  TECHNIQUE: Contiguous axial images were obtained from the base of the skull through the vertex without and with intravenous contrast  CONTRAST:  21m OMNIPAQUE IOHEXOL 350 MG/ML SOLN  COMPARISON:  MRI 08/24/2013  FINDINGS: The brain has a normal appearance without evidence of atrophy, infarction, mass lesion, hemorrhage, hydrocephalus or extra-axial collection. The calvarium is unremarkable. The paranasal sinuses, middle ears and mastoids are clear. After contrast administration, no abnormal enhancement occurs.  IMPRESSION: Normal head CT. No evidence of metastatic disease or other intracranial pathology.   Electronically Signed   By: MNelson ChimesM.D.   On: 08/24/2014 09:58   Ct Soft Tissue Neck W Contrast  08/24/2014   CLINICAL DATA:   Carcinoma lung  EXAM: CT NECK WITH CONTRAST  TECHNIQUE: Multidetector CT imaging of the neck was performed using the standard protocol following the bolus administration of intravenous contrast.  CONTRAST:  762mOMNIPAQUE IOHEXOL 350 MG/ML SOLN  COMPARISON:  PET-CT 09/01/2013  FINDINGS: Pharynx and larynx: Enhancing mass lesion base of tongue on the right measuring 10 x 17 mm, suspicious for carcinoma . This was not PET avid on the prior PET-CT. There was however some hyper enhancement in the lateral tongue on the right on the prior PET. This area is negative on the current study. Recommend direct visualization of the tongue and biopsy base of tongue. Normal larynx and pharynx.  Salivary glands: Enlargement of the right submandibular gland. Along the upper portion of the gland there appears to be a 10 x 17 mm enhancing mass lesion with slightly greater density than the remainder of the gland. Review of the prior PET-CT reveals similar appearance on the prior CT with fullness of the right submandibular gland and mild hypermetabolic activity. These findings are concerning for neoplasm. Chronic sialoadenitis also possible. Parotid gland is homogeneous and normal bilaterally.  Thyroid: Negative  Lymph nodes: Negative for cervical adenopathy.  Vascular: Carotid artery and jugular vein patent bilaterally. Left-sided Port-A-Cath noted extending to the SVC.  Limited intracranial: Negative  Visualized orbits: Not imaged  Mastoids and visualized paranasal sinuses: Negative  Skeleton: Mild cervical degenerative change.  No focal bony lesion.  Upper chest: 9 mm left upper lobe nodule shows interval growth since the prior PET-CT. This has spiculated margins and is worrisome for developing neoplasm. See chest CT report from today.  IMPRESSION: Asymmetry of the right submandibular gland. The right some interval gland is larger and has possible 10 x 17 mm mass in the superior aspect. This is similar to the prior PET-CT were mild  hypermetabolic activity was noted. This may represent a low-grade neoplasm or chronic inflammation.  10 x 17 mm mass base of tongue on the right worrisome for squamous cell carcinoma. Biopsy recommended. Attention to right lateral tongue also recommended.  No cervical adenopathy  Enlarging left upper lobe nodule worrisome for carcinoma.   Electronically Signed   By: ChFranchot Gallo.D.   On: 08/24/2014 10:52  Ct Chest W Contrast  08/24/2014   CLINICAL DATA:  Restaging right lung squamous cell carcinoma post chemotherapy and radiation therapy completed in 2015. Subsequent encounter.  EXAM: CT CHEST WITH CONTRAST  TECHNIQUE: Multidetector CT imaging of the chest was performed during intravenous contrast administration.  CONTRAST:  10m OMNIPAQUE IOHEXOL 350 MG/ML SOLN  COMPARISON:  Chest CT 11/21/2013 and 06/08/2013.  PET-CT 09/01/2013.  FINDINGS: Mediastinum/Nodes: There has been further improvement in the previously demonstrated mediastinal lymphadenopathy which was hypermetabolic on PET-CT. Currently, there are no enlarged mediastinal or hilar lymph nodes. Right paratracheal node measures 7 mm on image 21 (previously 11 mm). The thyroid gland, trachea and esophagus demonstrate no significant findings. The heart size is normal. There is no pericardial effusion.Left IJ Port-A-Cath tip is near the SVC right atrial junction. Mild atherosclerosis appears unchanged.  Lungs/Pleura: There is no pleural effusion. There are paramediastinal radiation changes medially in both lungs with associated central bronchial distortion. Small nodular density dependently in the right mainstem bronchus appears linear on the reformatted images, consistent with a secretion. There are stable mild emphysematous changes. The left apical spiculated nodule has enlarged and increased in density, measuring 10 x 10 mm transverse on image 18 and 15 mm craniocaudal on sagittal image number 136. No other suspicious nodules identified.  Upper  abdomen: Stable without suspicious findings. There is a stable small right adrenal myelolipoma.  Musculoskeletal/Chest wall: There is no chest wall mass or suspicious osseous finding. Old healed rib fractures noted on the left.  IMPRESSION: 1. Interval enlargement of spiculated left apical nodule. This may reflect enlargement of the original primary lesion or a developing second primary bronchogenic carcinoma. 2. Further improvement in mediastinal adenopathy. There are no pathologically enlarged mediastinal or hilar lymph nodes. 3. Radiation changes in the perihilar regions of both lungs.   Electronically Signed   By: WRichardean SaleM.D.   On: 08/24/2014 10:13    ASSESSMENT: 1.  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  Lung cancer, squamous cell carcinoma poorly differentiated metastases to lymph node in mediastinotomy.  Left apical nodule shows progressive enlargement on CT scan.  Patient will be referred to Dr. CDonella Stadefor possibility of radiation therapy This could be primary reason   MEDICAL DECISION MAKING:  CT scan of head and neck as well as lung has been reviewed. I discussed situation with Dr. CDonella Stadewill proceed with PET scanning mediastinal lymph node R hypermetabolic.  Depending on that radiation therapy to the lung versus radiation therapy to the lung and mediastinum can be planned. This has been discussed with the patient and family..   Patient expressed understanding and was in agreement with this plan. He also understands that He can call clinic at any time with any questions, concerns, or complaints.    No matching staging information was found for the patient.  JForest Gleason MD   09/20/2014 8:09 AM

## 2014-09-15 ENCOUNTER — Encounter: Payer: Self-pay | Admitting: Radiation Oncology

## 2014-09-15 ENCOUNTER — Ambulatory Visit
Admission: RE | Admit: 2014-09-15 | Discharge: 2014-09-15 | Disposition: A | Discharge status: Home / Self Care | Payer: Medicare Other | Source: Ambulatory Visit | Attending: Radiation Oncology | Admitting: Radiation Oncology

## 2014-09-15 VITALS — BP 124/74 | HR 48 | Temp 98.5°F | Resp 20 | Wt 199.0 lb

## 2014-09-15 DIAGNOSIS — C3492 Malignant neoplasm of unspecified part of left bronchus or lung: Secondary | ICD-10-CM | POA: Insufficient documentation

## 2014-09-15 DIAGNOSIS — Z85818 Personal history of malignant neoplasm of other sites of lip, oral cavity, and pharynx: Secondary | ICD-10-CM | POA: Insufficient documentation

## 2014-09-15 DIAGNOSIS — Z85118 Personal history of other malignant neoplasm of bronchus and lung: Secondary | ICD-10-CM | POA: Insufficient documentation

## 2014-09-15 DIAGNOSIS — Z510 Encounter for antineoplastic radiation therapy: Secondary | ICD-10-CM | POA: Insufficient documentation

## 2014-09-15 NOTE — Consult Note (Signed)
Radiation Oncology NEW PATIENT EVALUATION  Name: Jason Bryan  MRN: [id number]  Date:   09/15/2014     DOB: 31-Jul-1955   This 60 y.o. male patient presents to the clinic for initial evaluation of new left lung speaking related nodule presumed progressive squamous cell carcinoma in patient with previous poorly differentiated stage IIIB lung cancer status post induction chemotherapy and radiation therapy also history of locally advanced squamous cell carcinoma of the nasopharynx.Marland Kitchen  REFERRING PHYSICIAN: No ref. provider found  CHIEF COMPLAINT:  Chief Complaint  Patient presents with  . Follow-up    OPNA, pt is here today for a new area found on CT of his lung.      DIAGNOSIS: The encounter diagnosis was Personal history of malignant neoplasm of bronchus and lung.   PREVIOUS INVESTIGATIONS:  CT scans and prior PET/CT scans are reviewed Prior pathology reports reviewed Clinical notes reviewed  HPI: Patient is a 59 year old male well known to our department having been treated twice before. He was treated back in 2012 for locally advanced squamous cell carcinoma the nasopharynx with concurrent chemotherapy and radiation. He was followed developed mediastinal adenopathy biopsy again positive for poorly differentiated carcinoma which cannot be differentiated from a new lung primary versus metastatic disease from his nasopharynx. He had chemotherapy as well as radiation therapy to his chest focusing on hypermetabolic areas in his mediastinum to 6000 cGy. He was staged a IIIB chest (T1 N2 M0. Most recently on repeat CT scan of his chest is noted to have interval enlargement of a left apical spiculated mass concerning for either primary or progressive bronchogenic carcinoma. Mediastinum looks unremarkable with no enlarged mediastinal or hilar nodes. I been asked to evaluate the patient for SB RT treatment to the new left lung nodule. He is fairly asymptomatic at this time specifically denies cough  hemoptysis or chest tightness.  PLANNED TREATMENT REGIMEN: SB RT radiation therapy to 5000 cGy in 5 fractions to left lung mass  PAST MEDICAL HISTORY:  has a past medical history of Cancer (2011) and Squamous cell lung cancer (2015).    PAST SURGICAL HISTORY: History reviewed. No pertinent past surgical history.  FAMILY HISTORY: family history includes Cancer in his mother.  SOCIAL HISTORY:  reports that he has quit smoking. His smoking use included Cigarettes. He has a 40 pack-year smoking history. He has never used smokeless tobacco. He reports that he does not drink alcohol or use illicit drugs.  ALLERGIES: Review of patient's allergies indicates no known allergies.  MEDICATIONS:  Current Outpatient Prescriptions  Medication Sig Dispense Refill  . budesonide-formoterol (SYMBICORT) 160-4.5 MCG/ACT inhaler Inhale 1 puff into the lungs daily. 1 Inhaler 3  . temazepam (RESTORIL) 30 MG capsule Take 1 capsule by mouth daily.    . traZODone (DESYREL) 50 MG tablet Take 1 tablet by mouth as needed.     No current facility-administered medications for this encounter.    ECOG PERFORMANCE STATUS:  0 - Asymptomatic  REVIEW OF SYSTEMS:  Patient denies any weight loss, fatigue, weakness, fever, chills or night sweats. Patient denies any loss of vision, blurred vision. Patient denies any ringing  of the ears or hearing loss. No irregular heartbeat. Patient denies heart murmur or history of fainting. Patient denies any chest pain or pain radiating to her upper extremities. Patient denies any shortness of breath, difficulty breathing at night, cough or hemoptysis. Patient denies any swelling in the lower legs. Patient denies any nausea vomiting, vomiting of blood, or coffee ground material  in the vomitus. Patient denies any stomach pain. Patient states has had normal bowel movements no significant constipation or diarrhea. Patient denies any dysuria, hematuria or significant nocturia. Patient denies any  problems walking, swelling in the joints or loss of balance. Patient denies any skin changes, loss of hair or loss of weight. Patient denies any excessive worrying or anxiety or significant depression. Patient denies any problems with insomnia. Patient denies excessive thirst, polyuria, polydipsia. Patient denies any swollen glands, patient denies easy bruising or easy bleeding. Patient denies any recent infections, allergies or URI. Patient "s visual fields have not changed significantly in recent time.    PHYSICAL EXAM: BP 124/74 mmHg  Pulse 48  Temp(Src) 98.5 F (36.9 C)  Resp 20  Wt 198 lb 15.4 oz (90.25 kg) Well-developed well-nourished patient in NAD. HEENT reveals PERLA, EOMI, discs not visualized.  Oral cavity is clear. No oral mucosal lesions are identified. Neck is clear without evidence of cervical or supraclavicular adenopathy. Lungs are clear to A&P. Cardiac examination is essentially unremarkable with regular rate and rhythm without murmur rub or thrill. Abdomen is benign with no organomegaly or masses noted. Motor sensory and DTR levels are equal and symmetric in the upper and lower extremities. Cranial nerves II through XII are grossly intact. Proprioception is intact. No peripheral adenopathy or edema is identified. No motor or sensory levels are noted. Crude visual fields are within normal range.   LABORATORY DATA:  No results found for this or any previous visit (from the past 72 hour(s)).   RADIOLOGY RESULTS: No results found.  IMPRESSION: New peripheral lung lesion in patient with history of 2 primary cancers lung in nasopharynx consistent with either primary new bronchogenic carcinoma or metastatic focus from prior lesions.  PLAN: I have discussed the case personally with medical oncology. At this time I have recommended 5000 cGy in 5 fractions of SB RT treatment to his left lung. Risks and benefits of treatment including possible fatigue, alteration of blood counts, cough,  skin reaction all were discussed in detail with the patient and his wife. I have set up and ordered CT simulation with motion projection for next week. Patient comprehend my treatment plan well.  I would like to take this opportunity for allowing me to participate in the care of your patient.Armstead Peaks., MD

## 2014-09-20 ENCOUNTER — Encounter: Payer: Self-pay | Admitting: Oncology

## 2014-09-22 ENCOUNTER — Ambulatory Visit
Admission: RE | Admit: 2014-09-22 | Discharge: 2014-09-22 | Disposition: A | Discharge status: Home / Self Care | Payer: Medicare Other | Source: Ambulatory Visit | Attending: Radiation Oncology | Admitting: Radiation Oncology

## 2014-09-22 ENCOUNTER — Inpatient Hospital Stay: Payer: Medicare Other | Attending: Radiation Oncology

## 2014-09-22 DIAGNOSIS — C3492 Malignant neoplasm of unspecified part of left bronchus or lung: Secondary | ICD-10-CM | POA: Diagnosis not present

## 2014-09-22 DIAGNOSIS — Z85818 Personal history of malignant neoplasm of other sites of lip, oral cavity, and pharynx: Secondary | ICD-10-CM | POA: Diagnosis not present

## 2014-09-22 DIAGNOSIS — Z452 Encounter for adjustment and management of vascular access device: Secondary | ICD-10-CM | POA: Insufficient documentation

## 2014-09-22 DIAGNOSIS — Z85118 Personal history of other malignant neoplasm of bronchus and lung: Secondary | ICD-10-CM | POA: Diagnosis not present

## 2014-09-22 DIAGNOSIS — C779 Secondary and unspecified malignant neoplasm of lymph node, unspecified: Secondary | ICD-10-CM | POA: Insufficient documentation

## 2014-09-22 DIAGNOSIS — Z510 Encounter for antineoplastic radiation therapy: Secondary | ICD-10-CM | POA: Diagnosis not present

## 2014-09-22 DIAGNOSIS — C3491 Malignant neoplasm of unspecified part of right bronchus or lung: Secondary | ICD-10-CM | POA: Insufficient documentation

## 2014-09-29 DIAGNOSIS — C3492 Malignant neoplasm of unspecified part of left bronchus or lung: Secondary | ICD-10-CM | POA: Diagnosis not present

## 2014-09-29 DIAGNOSIS — Z85818 Personal history of malignant neoplasm of other sites of lip, oral cavity, and pharynx: Secondary | ICD-10-CM | POA: Diagnosis not present

## 2014-09-29 DIAGNOSIS — Z510 Encounter for antineoplastic radiation therapy: Secondary | ICD-10-CM | POA: Diagnosis not present

## 2014-09-29 DIAGNOSIS — Z85118 Personal history of other malignant neoplasm of bronchus and lung: Secondary | ICD-10-CM | POA: Diagnosis not present

## 2014-10-03 ENCOUNTER — Ambulatory Visit
Admission: RE | Admit: 2014-10-03 | Discharge: 2014-10-03 | Disposition: A | Discharge status: Home / Self Care | Payer: Medicare Other | Source: Ambulatory Visit | Attending: Radiation Oncology | Admitting: Radiation Oncology

## 2014-10-03 ENCOUNTER — Inpatient Hospital Stay: Payer: Medicare Other

## 2014-10-04 ENCOUNTER — Ambulatory Visit
Admission: RE | Admit: 2014-10-04 | Discharge: 2014-10-04 | Disposition: A | Discharge status: Home / Self Care | Payer: Medicare Other | Source: Ambulatory Visit | Attending: Radiation Oncology | Admitting: Radiation Oncology

## 2014-10-04 DIAGNOSIS — Z85818 Personal history of malignant neoplasm of other sites of lip, oral cavity, and pharynx: Secondary | ICD-10-CM | POA: Diagnosis not present

## 2014-10-04 DIAGNOSIS — C3492 Malignant neoplasm of unspecified part of left bronchus or lung: Secondary | ICD-10-CM | POA: Diagnosis not present

## 2014-10-04 DIAGNOSIS — Z510 Encounter for antineoplastic radiation therapy: Secondary | ICD-10-CM | POA: Diagnosis not present

## 2014-10-04 DIAGNOSIS — Z85118 Personal history of other malignant neoplasm of bronchus and lung: Secondary | ICD-10-CM | POA: Diagnosis not present

## 2014-10-05 ENCOUNTER — Ambulatory Visit: Payer: Medicare Other | Admitting: Radiation Oncology

## 2014-10-06 ENCOUNTER — Inpatient Hospital Stay: Payer: Medicare Other

## 2014-10-06 ENCOUNTER — Ambulatory Visit
Admission: RE | Admit: 2014-10-06 | Discharge: 2014-10-06 | Disposition: A | Discharge status: Home / Self Care | Payer: Medicare Other | Source: Ambulatory Visit | Attending: Radiation Oncology | Admitting: Radiation Oncology

## 2014-10-06 DIAGNOSIS — C801 Malignant (primary) neoplasm, unspecified: Secondary | ICD-10-CM

## 2014-10-06 DIAGNOSIS — Z510 Encounter for antineoplastic radiation therapy: Secondary | ICD-10-CM | POA: Diagnosis not present

## 2014-10-06 DIAGNOSIS — Z85118 Personal history of other malignant neoplasm of bronchus and lung: Secondary | ICD-10-CM | POA: Diagnosis not present

## 2014-10-06 DIAGNOSIS — Z452 Encounter for adjustment and management of vascular access device: Secondary | ICD-10-CM | POA: Diagnosis not present

## 2014-10-06 DIAGNOSIS — C3491 Malignant neoplasm of unspecified part of right bronchus or lung: Secondary | ICD-10-CM | POA: Diagnosis not present

## 2014-10-06 DIAGNOSIS — Z85818 Personal history of malignant neoplasm of other sites of lip, oral cavity, and pharynx: Secondary | ICD-10-CM | POA: Diagnosis not present

## 2014-10-06 DIAGNOSIS — C779 Secondary and unspecified malignant neoplasm of lymph node, unspecified: Secondary | ICD-10-CM | POA: Diagnosis not present

## 2014-10-06 DIAGNOSIS — C3492 Malignant neoplasm of unspecified part of left bronchus or lung: Secondary | ICD-10-CM | POA: Diagnosis not present

## 2014-10-06 MED ORDER — SODIUM CHLORIDE 0.9 % IJ SOLN
10.0000 mL | INTRAMUSCULAR | Status: DC | PRN
  Administered 2014-10-06: 10 mL via INTRAVENOUS
  Filled 2014-10-06: qty 10

## 2014-10-06 MED ORDER — HEPARIN SOD (PORK) LOCK FLUSH 100 UNIT/ML IV SOLN
500.0000 [IU] | Freq: Once | INTRAVENOUS | Status: AC
  Administered 2014-10-06: 500 [IU] via INTRAVENOUS
  Filled 2014-10-06: qty 5

## 2014-10-06 NOTE — Progress Notes (Unsigned)
PSN met with patient today after his radiation treatment.  Patient reported that he and his wife live on 1,100 a month.  Due to a financial hardship, he is unable to pay his [employer] bill at this time.  He has received a disconnect notice.  PSN agreed to pay the bill through the [employer].  [employer] was notified that bill would be paid.  No other needs identified at this time.

## 2014-10-07 ENCOUNTER — Inpatient Hospital Stay: Payer: Medicare Other

## 2014-10-10 ENCOUNTER — Inpatient Hospital Stay: Payer: Medicare Other

## 2014-10-10 ENCOUNTER — Ambulatory Visit
Admission: RE | Admit: 2014-10-10 | Discharge: 2014-10-10 | Disposition: A | Discharge status: Home / Self Care | Payer: Medicare Other | Source: Ambulatory Visit | Attending: Radiation Oncology | Admitting: Radiation Oncology

## 2014-10-10 DIAGNOSIS — Z85118 Personal history of other malignant neoplasm of bronchus and lung: Secondary | ICD-10-CM | POA: Diagnosis not present

## 2014-10-10 DIAGNOSIS — Z510 Encounter for antineoplastic radiation therapy: Secondary | ICD-10-CM | POA: Diagnosis not present

## 2014-10-10 DIAGNOSIS — C3492 Malignant neoplasm of unspecified part of left bronchus or lung: Secondary | ICD-10-CM | POA: Diagnosis not present

## 2014-10-10 DIAGNOSIS — Z85818 Personal history of malignant neoplasm of other sites of lip, oral cavity, and pharynx: Secondary | ICD-10-CM | POA: Diagnosis not present

## 2014-10-12 ENCOUNTER — Ambulatory Visit
Admission: RE | Admit: 2014-10-12 | Discharge: 2014-10-12 | Disposition: A | Discharge status: Home / Self Care | Payer: Medicare Other | Source: Ambulatory Visit | Attending: Radiation Oncology | Admitting: Radiation Oncology

## 2014-10-12 DIAGNOSIS — Z85818 Personal history of malignant neoplasm of other sites of lip, oral cavity, and pharynx: Secondary | ICD-10-CM | POA: Diagnosis not present

## 2014-10-12 DIAGNOSIS — C3492 Malignant neoplasm of unspecified part of left bronchus or lung: Secondary | ICD-10-CM | POA: Diagnosis not present

## 2014-10-12 DIAGNOSIS — Z510 Encounter for antineoplastic radiation therapy: Secondary | ICD-10-CM | POA: Diagnosis not present

## 2014-10-12 DIAGNOSIS — Z85118 Personal history of other malignant neoplasm of bronchus and lung: Secondary | ICD-10-CM | POA: Diagnosis not present

## 2014-10-17 ENCOUNTER — Encounter: Payer: Self-pay | Admitting: Radiation Oncology

## 2014-10-17 ENCOUNTER — Ambulatory Visit
Admission: RE | Admit: 2014-10-17 | Discharge: 2014-10-17 | Disposition: A | Discharge status: Home / Self Care | Payer: Medicare Other | Source: Ambulatory Visit | Attending: Radiation Oncology | Admitting: Radiation Oncology

## 2014-10-17 DIAGNOSIS — C3412 Malignant neoplasm of upper lobe, left bronchus or lung: Secondary | ICD-10-CM

## 2014-10-17 DIAGNOSIS — C3492 Malignant neoplasm of unspecified part of left bronchus or lung: Secondary | ICD-10-CM | POA: Diagnosis not present

## 2014-10-17 DIAGNOSIS — Z85118 Personal history of other malignant neoplasm of bronchus and lung: Secondary | ICD-10-CM | POA: Diagnosis not present

## 2014-10-17 DIAGNOSIS — Z85818 Personal history of malignant neoplasm of other sites of lip, oral cavity, and pharynx: Secondary | ICD-10-CM | POA: Diagnosis not present

## 2014-10-17 DIAGNOSIS — Z510 Encounter for antineoplastic radiation therapy: Secondary | ICD-10-CM | POA: Diagnosis not present

## 2014-10-17 NOTE — Progress Notes (Signed)
Radiation Oncology Follow up Note  Name: Jason Bryan   Date:   10/17/2014 MRN:  [id number] DOB: 29-Dec-1955    This 59 y.o. male presents to the clinic today for .Patient is a 59 year old male well known to our department having been treated twice before. He was treated back in 2012 for locally advanced squamous cell carcinoma the nasopharynx with concurrent chemotherapy and radiation. He was followed developed mediastinal adenopathy biopsy again positive for poorly differentiated carcinoma which cannot be differentiated from a new lung primary versus metastatic disease from his nasopharynx. He had chemotherapy as well as radiation therapy to his chest focusing on hypermetabolic areas in his mediastinum to 6000 cGy. He was staged a IIIB chest (T1 N2 M0. Most recently on repeat CT scan of his chest is noted to have interval enlargement of a left apical spiculated mass concerning for either primary or progressive bronchogenic carcinoma. Mediastinum looks unremarkable with no enlarged mediastinal or hilar nodes. I been asked to evaluate the patient for SB RT treatment to the new left lung nodule. He is fairly asymptomatic at this time specifically denies cough hemoptysis or chest tightness.He was referred to radiation oncology treated with SB RT.  REFERRING PROVIDER: No ref. provider found  HPI: as above.  COMPLICATIONS OF TREATMENT: none  FOLLOW UP COMPLIANCE: keeps appointments   PHYSICAL EXAM:  There were no vitals taken for this visit. Well-developed well-nourished patient in NAD. HEENT reveals PERLA, EOMI, discs not visualized.  Oral cavity is clear. No oral mucosal lesions are identified. Neck is clear without evidence of cervical or supraclavicular adenopathy. Lungs are clear to A&P. Cardiac examination is essentially unremarkable with regular rate and rhythm without murmur rub or thrill. Abdomen is benign with no organomegaly or masses noted. Motor sensory and DTR levels are equal and  symmetric in the upper and lower extremities. Cranial nerves II through XII are grossly intact. Proprioception is intact. No peripheral adenopathy or edema is identified. No motor or sensory levels are noted. Crude visual fields are within normal range.   RADIOLOGY RESULTS: no current films to review  PLAN: patient is completed SB RT tolerated extremely well with no side effects or complaints. One month follow-up was scheduled anticipate repeat chest CT in about 3 months. Patient is to call with any concerns.    Armstead Peaks., MD

## 2014-11-11 ENCOUNTER — Telehealth: Payer: Self-pay | Admitting: *Deleted

## 2014-11-11 DIAGNOSIS — C3490 Malignant neoplasm of unspecified part of unspecified bronchus or lung: Secondary | ICD-10-CM

## 2014-11-11 MED ORDER — BUDESONIDE-FORMOTEROL FUMARATE 160-4.5 MCG/ACT IN AERO: 1.0000 | INHALATION_SPRAY | Freq: Every day | RESPIRATORY_TRACT | Status: DC

## 2014-11-11 MED ORDER — TEMAZEPAM 30 MG PO CAPS: 30.0000 mg | ORAL_CAPSULE | Freq: Every day | ORAL | Status: DC

## 2014-11-11 NOTE — Telephone Encounter (Signed)
faxed

## 2014-11-18 ENCOUNTER — Inpatient Hospital Stay: Payer: Medicare Other | Attending: Oncology

## 2014-11-18 DIAGNOSIS — Z79899 Other long term (current) drug therapy: Secondary | ICD-10-CM | POA: Insufficient documentation

## 2014-11-18 DIAGNOSIS — C3491 Malignant neoplasm of unspecified part of right bronchus or lung: Secondary | ICD-10-CM | POA: Insufficient documentation

## 2014-11-18 DIAGNOSIS — F418 Other specified anxiety disorders: Secondary | ICD-10-CM | POA: Insufficient documentation

## 2014-11-18 DIAGNOSIS — Z85819 Personal history of malignant neoplasm of unspecified site of lip, oral cavity, and pharynx: Secondary | ICD-10-CM | POA: Insufficient documentation

## 2014-11-18 DIAGNOSIS — Z87891 Personal history of nicotine dependence: Secondary | ICD-10-CM | POA: Insufficient documentation

## 2014-11-18 DIAGNOSIS — Z923 Personal history of irradiation: Secondary | ICD-10-CM | POA: Insufficient documentation

## 2014-11-18 DIAGNOSIS — Z23 Encounter for immunization: Secondary | ICD-10-CM | POA: Insufficient documentation

## 2014-11-18 DIAGNOSIS — C779 Secondary and unspecified malignant neoplasm of lymph node, unspecified: Secondary | ICD-10-CM | POA: Insufficient documentation

## 2014-11-18 DIAGNOSIS — J449 Chronic obstructive pulmonary disease, unspecified: Secondary | ICD-10-CM | POA: Insufficient documentation

## 2014-11-18 DIAGNOSIS — Z9221 Personal history of antineoplastic chemotherapy: Secondary | ICD-10-CM | POA: Insufficient documentation

## 2014-12-07 ENCOUNTER — Ambulatory Visit: Payer: Medicare Other

## 2014-12-07 ENCOUNTER — Encounter: Payer: Self-pay | Admitting: Radiation Oncology

## 2014-12-07 ENCOUNTER — Ambulatory Visit
Admission: RE | Admit: 2014-12-07 | Discharge: 2014-12-07 | Disposition: A | Discharge status: Home / Self Care | Payer: Medicare Other | Source: Ambulatory Visit | Attending: Radiation Oncology | Admitting: Radiation Oncology

## 2014-12-07 ENCOUNTER — Other Ambulatory Visit: Payer: Self-pay | Admitting: *Deleted

## 2014-12-07 ENCOUNTER — Inpatient Hospital Stay: Payer: Medicare Other

## 2014-12-07 ENCOUNTER — Inpatient Hospital Stay (HOSPITAL_BASED_OUTPATIENT_CLINIC_OR_DEPARTMENT_OTHER): Payer: Medicare Other | Admitting: Oncology

## 2014-12-07 VITALS — BP 141/73 | HR 57 | Temp 98.3°F | Wt 201.2 lb

## 2014-12-07 DIAGNOSIS — Z79899 Other long term (current) drug therapy: Secondary | ICD-10-CM | POA: Diagnosis not present

## 2014-12-07 DIAGNOSIS — Z923 Personal history of irradiation: Secondary | ICD-10-CM | POA: Diagnosis not present

## 2014-12-07 DIAGNOSIS — Z9221 Personal history of antineoplastic chemotherapy: Secondary | ICD-10-CM

## 2014-12-07 DIAGNOSIS — C779 Secondary and unspecified malignant neoplasm of lymph node, unspecified: Secondary | ICD-10-CM | POA: Diagnosis not present

## 2014-12-07 DIAGNOSIS — C3492 Malignant neoplasm of unspecified part of left bronchus or lung: Secondary | ICD-10-CM

## 2014-12-07 DIAGNOSIS — Z87891 Personal history of nicotine dependence: Secondary | ICD-10-CM | POA: Diagnosis not present

## 2014-12-07 DIAGNOSIS — Z85819 Personal history of malignant neoplasm of unspecified site of lip, oral cavity, and pharynx: Secondary | ICD-10-CM | POA: Diagnosis not present

## 2014-12-07 DIAGNOSIS — C3412 Malignant neoplasm of upper lobe, left bronchus or lung: Secondary | ICD-10-CM

## 2014-12-07 DIAGNOSIS — J449 Chronic obstructive pulmonary disease, unspecified: Secondary | ICD-10-CM

## 2014-12-07 DIAGNOSIS — C3490 Malignant neoplasm of unspecified part of unspecified bronchus or lung: Secondary | ICD-10-CM

## 2014-12-07 DIAGNOSIS — C3491 Malignant neoplasm of unspecified part of right bronchus or lung: Secondary | ICD-10-CM

## 2014-12-07 DIAGNOSIS — F418 Other specified anxiety disorders: Secondary | ICD-10-CM

## 2014-12-07 DIAGNOSIS — Z23 Encounter for immunization: Secondary | ICD-10-CM | POA: Diagnosis not present

## 2014-12-07 LAB — CBC WITH DIFFERENTIAL/PLATELET
BASOS PCT: 0 %
Basophils Absolute: 0 10*3/uL (ref 0–0.1)
EOS PCT: 1 %
Eosinophils Absolute: 0.1 10*3/uL (ref 0–0.7)
HCT: 43.6 % (ref 40.0–52.0)
Hemoglobin: 14.9 g/dL (ref 13.0–18.0)
LYMPHS ABS: 0.6 10*3/uL — AB (ref 1.0–3.6)
Lymphocytes Relative: 8 %
MCH: 31.1 pg (ref 26.0–34.0)
MCHC: 34.1 g/dL (ref 32.0–36.0)
MCV: 91.2 fL (ref 80.0–100.0)
MONO ABS: 0.6 10*3/uL (ref 0.2–1.0)
Monocytes Relative: 7 %
NEUTROS PCT: 84 %
Neutro Abs: 6.9 10*3/uL — ABNORMAL HIGH (ref 1.4–6.5)
PLATELETS: 276 10*3/uL (ref 150–440)
RBC: 4.78 MIL/uL (ref 4.40–5.90)
RDW: 15.1 % — AB (ref 11.5–14.5)
WBC: 8.2 10*3/uL (ref 3.8–10.6)

## 2014-12-07 LAB — COMPREHENSIVE METABOLIC PANEL
ALBUMIN: 4.3 g/dL (ref 3.5–5.0)
ALK PHOS: 74 U/L (ref 38–126)
ALT: 19 U/L (ref 17–63)
ANION GAP: 11 (ref 5–15)
AST: 26 U/L (ref 15–41)
BUN: 14 mg/dL (ref 6–20)
CO2: 25 mmol/L (ref 22–32)
Calcium: 9 mg/dL (ref 8.9–10.3)
Chloride: 103 mmol/L (ref 101–111)
Creatinine, Ser: 1 mg/dL (ref 0.61–1.24)
GFR calc Af Amer: >60 mL/min (ref >60)
GFR calc non Af Amer: >60 mL/min (ref >60)
GLUCOSE: 175 mg/dL — AB (ref 65–99)
POTASSIUM: 3.9 mmol/L (ref 3.5–5.1)
SODIUM: 139 mmol/L (ref 135–145)
Total Bilirubin: 0.7 mg/dL (ref 0.3–1.2)
Total Protein: 7.1 g/dL (ref 6.5–8.1)

## 2014-12-07 MED ORDER — INFLUENZA VAC SPLIT QUAD 0.5 ML IM SUSY
0.5000 mL | PREFILLED_SYRINGE | Freq: Once | INTRAMUSCULAR | Status: AC
  Administered 2014-12-07: 0.5 mL via INTRAMUSCULAR

## 2014-12-07 NOTE — Progress Notes (Signed)
Radiation Oncology Follow up Note  Name: Jason Bryan   Date:   12/07/2014 MRN:  [id number] DOB: Dec 16, 1955    This 59 y.o. male presents to the clinic today for follow-up for lung cancer as well as head and neck cancer.  REFERRING PROVIDER: No ref. 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 He is now 1 month out having completed SB RT to a new left lung nodule. He is also previously had radiation therapy to his chest for stage IIIB non-small cell lung cancer. He is seen today in routine follow-up one month out from SB RT is doing well he specifically denies cough hemoptysis or chest tightness. He's also having no head and neck pain or dysphagia..  COMPLICATIONS OF TREATMENT: none  FOLLOW UP COMPLIANCE: keeps appointments   PHYSICAL EXAM:  BP 141/73 mmHg  Pulse 57  Temp(Src) 98.3 F (36.8 C)  Wt 201 lb 2.7 oz (91.25 kg) Oral cavity is clear no oral mucosal lesions are identified indirect mirror examination shows upper airway clear vallecula and base of tongue within normal limits. Neck shows some firmness in the left sub-digastric region. This certainly may be scarring from prior treatment. No supraclavicular or axillary adenopathy is appreciated lungs are clear to A&P. Well-developed well-nourished patient in NAD. HEENT reveals PERLA, EOMI, discs not visualized.  Oral cavity is clear. No oral mucosal lesions are identified. Neck is clear without evidence of cervical or supraclavicular adenopathy. Lungs are clear to A&P. Cardiac examination is essentially unremarkable with regular rate and rhythm without murmur rub or thrill. Abdomen is benign with no organomegaly or masses noted. Motor sensory and DTR levels are equal and symmetric in the upper and lower extremities. Cranial nerves II through XII are grossly intact.  Proprioception is intact. No peripheral adenopathy or edema is identified. No motor or sensory levels are noted. Crude visual fields are within normal range.    RADIOLOGY RESULTS: No current films for review CT scan of the chest has been ordered for the next 3 months.  PLAN: At this time patient is doing well recovering nicely from his SB RT treatment. I am please was overall progress. I've ordered a follow-up CT scan of the chest with contrast prior to his next visit in 3-4 months. He continues close follow-up care with medical oncology. Not sure of the area of the left neck certainly may be some scarring although should he develop any pain or discomfort in that area will order a CT scan immediately.  I would like to take this opportunity for allowing me to participate in the care of your patient.Armstead Peaks., MD

## 2014-12-09 ENCOUNTER — Encounter: Payer: Self-pay | Admitting: Oncology

## 2014-12-09 NOTE — Progress Notes (Signed)
[place] @ [facility] Telephone:(336) [phone]  Fax:(336) [place]: Oct 29, 1955  MR#: [id number]  GLO#:[id number]  Patient Care Team: Provider Not In System as PCP - General Seeplaputhur Robinette Haines, MD (General Surgery)   59 year old gentleman with a history of head and neck cancer and lung cancer came for further follow-up and review of CT scan   INTERVAL HISTORY:  59 year old gentleman came today further follow-up regarding head and neck cancer and lung cancer. Patient complains that the Port-A-Cath has been bothering him Continues to leaking at port site  Patient did not have any recent ENT evaluation  According to him he has stopped smoking REVIEW OF SYSTEMS:   GENERAL:  Feels good.  Active.  No fevers, sweats or weight loss. PERFORMANCE STATUS (ECOG): 01 Denies any cough or shortness of breath.  Chest wall discomfort and pain at the port site. Appetite has been stable No hemoptysis Review of all other 12 systems has been reported to be negative. Review of systems:  All other systems reviewed and found to be negative. As per HPI. Otherwise, a complete review of systems is negatve.  PAST MEDICAL HISTORY: Past Medical History  Diagnosis Date  . Cancer 2011    squemous stage 3  . Squamous cell lung cancer 2015    Right Lung CA with chemo + rad tx's.    PAST SURGICAL HISTORY: Head and neck surgery for metastases take cervical lymph node unknown primary  FAMILY HISTORY Family History  Problem Relation Age of Onset  . Cancer Mother     ADVANCED DIRECTIVES Patient does not have any living will or healthcare power of attorney.  Information was given .  Available resources had been discussed.  We will follow-up on subsequent appointments regarding this issue HEALTH MAINTENANCE: Social History  Substance Use Topics  . Smoking status: Former Smoker -- 1.00 packs/day for 40 years    Types: Cigarettes  . Smokeless tobacco: Never Used  . 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 Smoking Cessation Information Given to Patient .  PFSH: Family History: noncontributory  Comments: No family history of colorectal cancer, breast cancer, or ovarian cancer.  Social History: negative alcohol, positive tobacco  Smoker: Smoking cessation counseling performed  Smoker- Packs Per Day: One pack per day  Smoker- How Many Years: More than 40 years  Comments: Patient has been alcohol free since February of 2011.  Additional Past Medical and Surgical History: History of COPD.  And multiple admissions due to pneumonia and acute bronchitis.     No Known Allergies  Current Outpatient Prescriptions  Medication Sig Dispense Refill  . budesonide-formoterol (SYMBICORT) 160-4.5 MCG/ACT inhaler Inhale 1 puff into the lungs daily. 1 Inhaler 3  . temazepam (RESTORIL) 30 MG capsule Take 1 capsule (30 mg total) by mouth daily. 30 capsule 3  . traZODone (DESYREL) 50 MG tablet Take 1 tablet by mouth as needed.     No current facility-administered medications for this visit.    OBJECTIVE:  There were no vitals filed for this visit.   There is no weight on file to calculate BMI.    ECOG FS:1 - Symptomatic but completely ambulatory  PHYSICAL EXAM:  GENERAL:  Well developed, well nourished, sitting comfortably in the exam room in no acute distress. MENTAL STATUS:  Alert and oriented to person, place and time. HEAD: Normocephalic, atraumatic, face symmetric, no Cushingoid features. EYES:  Pupils equal round and reactive to light and accomodation.  No conjunctivitis or scleral icterus. ENT:  Oropharynx clear without lesion.  Tongue normal. Mucous membranes moist.  RESPIRATORY:  Clear to auscultation without rales, wheezes or  rhonchi. CARDIOVASCULAR:  Regular rate and rhythm without murmur, rub or gallop.  ABDOMEN:  Soft, non-tender, with active bowel sounds, and no hepatosplenomegaly.  No masses. BACK:  No CVA tenderness.  No tenderness on percussion of the back or rib cage. SKIN:  No rashes, ulcers or lesions. EXTREMITIES: No edema, no skin discoloration or tenderness.  No palpable cords. LYMPH NODES: No palpable cervical, supraclavicular, axillary or inguinal adenopathy  NEUROLOGICAL: Unremarkable. PSYCH:  Appropriate.  LAB RESULTS:  Appointment on 12/07/2014  Component Date Value Ref Range Status  . WBC 12/07/2014 8.2  3.8 - 10.6 K/uL Final  . RBC 12/07/2014 4.78  4.40 - 5.90 MIL/uL Final  . Hemoglobin 12/07/2014 14.9  13.0 - 18.0 g/dL Final  . HCT 12/07/2014 43.6  40.0 - 52.0 % Final  . MCV 12/07/2014 91.2  80.0 - 100.0 fL Final  . MCH 12/07/2014 31.1  26.0 - 34.0 pg Final  . MCHC 12/07/2014 34.1  32.0 - 36.0 g/dL Final  . RDW 12/07/2014 15.1* 11.5 - 14.5 % Final  . Platelets 12/07/2014 276  150 - 440 K/uL Final  . Neutrophils Relative % 12/07/2014 84   Final  . Neutro Abs 12/07/2014 6.9* 1.4 - 6.5 K/uL Final  . Lymphocytes Relative 12/07/2014 8   Final  . Lymphs Abs 12/07/2014 0.6* 1.0 - 3.6 K/uL Final  . Monocytes Relative 12/07/2014 7   Final  . Monocytes Absolute 12/07/2014 0.6  0.2 - 1.0 K/uL Final  . Eosinophils Relative 12/07/2014 1   Final  . Eosinophils Absolute 12/07/2014 0.1  0 - 0.7 K/uL Final  . Basophils Relative 12/07/2014 0   Final  . Basophils Absolute 12/07/2014 0.0  0 - 0.1 K/uL Final  . Sodium 12/07/2014 139  135 - 145 mmol/L Final  . Potassium 12/07/2014 3.9  3.5 - 5.1 mmol/L Final  . Chloride 12/07/2014 103  101 - 111 mmol/L Final  . CO2 12/07/2014 25  22 - 32 mmol/L Final  . Glucose, Bld 12/07/2014 175* 65 - 99 mg/dL Final  . BUN 12/07/2014 14  6 - 20 mg/dL Final  . Creatinine, Ser 12/07/2014 1.00  0.61 - 1.24 mg/dL Final  . Calcium 12/07/2014 9.0  8.9 - 10.3 mg/dL  Final  . Total Protein 12/07/2014 7.1  6.5 - 8.1 g/dL Final  . Albumin 12/07/2014 4.3  3.5 - 5.0 g/dL Final  . AST 12/07/2014 26  15 - 41 U/L Final  . ALT 12/07/2014 19  17 - 63 U/L Final  . Alkaline Phosphatase 12/07/2014 74  38 - 126 U/L Final  . Total Bilirubin 12/07/2014 0.7  0.3 - 1.2 mg/dL Final  . GFR calc non Af Amer 12/07/2014 >60  >60 mL/min Final  . GFR calc Af Amer 12/07/2014 >60  >60 mL/min Final   Comment: (NOTE) The eGFR has been calculated using the CKD EPI equation. This calculation has not been validated in all clinical situations. eGFR's persistently <60 mL/min signify possible Chronic Kidney Disease.   . Anion gap 12/07/2014 11  5 - 15 Final  ASSESSMENT: 1.  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  Lung cancer, squamous cell carcinoma poorly differentiated metastases to lymph  s/p chemotherapy radiation therapy  MEDICAL DECISION MAKING:  There is no evidence of recurrent disease based on clinical examination Port site is within normal limits but we may not need ports so information will be provided to Dr. Jamal Collin  for removal of the port Patient will see me in January if needed a repeat PET scan would be done Patient was encouraged to make an appointment with ENT surgeon for complete evaluation The patient was encouraged to get a flu shot Which can be given here in infusion center  Patient expressed understanding and was in agreement with this plan. He also understands that He can call clinic at any time with any questions, concerns, or complaints.    No matching staging information was found for the patient.  Forest Gleason, MD   12/09/2014 5:34 PM

## 2014-12-19 ENCOUNTER — Ambulatory Visit: Payer: Medicare Other | Admitting: General Surgery

## 2014-12-27 ENCOUNTER — Encounter: Payer: Self-pay | Admitting: General Surgery

## 2014-12-27 ENCOUNTER — Ambulatory Visit (INDEPENDENT_AMBULATORY_CARE_PROVIDER_SITE_OTHER): Payer: Medicare Other | Admitting: General Surgery

## 2014-12-27 VITALS — BP 140/72 | Ht 65.0 in | Wt 201.0 lb

## 2014-12-27 DIAGNOSIS — C771 Secondary and unspecified malignant neoplasm of intrathoracic lymph nodes: Secondary | ICD-10-CM

## 2014-12-27 DIAGNOSIS — C119 Malignant neoplasm of nasopharynx, unspecified: Secondary | ICD-10-CM | POA: Diagnosis not present

## 2014-12-27 NOTE — Patient Instructions (Addendum)
Keep area clean. PRN

## 2014-12-27 NOTE — Progress Notes (Signed)
Here today for port removal. No complaints. He has had no evidence of recurrence of his head and neck cancer after completion of treatment several months ago.  His oncologist has requested removal of port.  Procedure: Anesthesia: 10 mL of 1:1 mixture of 1% Xylocaine and 0.5% Marcaine Prep: Chloraprep Description: After the port site was prepped and draped as a sterile field, a 2 cm horizontal incision was made just inferior to the port and the subcutaneous tissue and pseudocapsule were divided to reveal the port. The anchoring sutures were removed and the port freed and removed. The tip of the catheter was fully intact. The subcutaneous tissue was closed with interrupted 3-0 Vicryl, the skin was closed with running subcuticular 4-0 Vicryl. The wound was dressed with benzoin, Steri strips, Telfa, and Tegaderm. Minimal blood loss, no complications.  Follow up as needed. Wound care instructions were given.

## 2014-12-30 ENCOUNTER — Inpatient Hospital Stay: Payer: Medicare Other

## 2015-02-10 ENCOUNTER — Inpatient Hospital Stay: Payer: Medicare Other

## 2015-03-13 ENCOUNTER — Other Ambulatory Visit: Payer: Self-pay | Admitting: Oncology

## 2015-03-16 ENCOUNTER — Telehealth: Payer: Self-pay | Admitting: *Deleted

## 2015-03-16 ENCOUNTER — Other Ambulatory Visit: Payer: Self-pay | Admitting: Oncology

## 2015-03-16 MED ORDER — TEMAZEPAM 30 MG PO CAPS: 30.0000 mg | ORAL_CAPSULE | Freq: Every evening | ORAL | Status: DC | PRN

## 2015-03-16 NOTE — Telephone Encounter (Signed)
faxed

## 2015-04-03 ENCOUNTER — Ambulatory Visit
Admission: RE | Admit: 2015-04-03 | Discharge: 2015-04-03 | Disposition: A | Discharge status: Home / Self Care | Payer: Medicare Other | Source: Ambulatory Visit | Attending: Radiation Oncology | Admitting: Radiation Oncology

## 2015-04-03 DIAGNOSIS — I7090 Unspecified atherosclerosis: Secondary | ICD-10-CM | POA: Diagnosis not present

## 2015-04-03 DIAGNOSIS — K760 Fatty (change of) liver, not elsewhere classified: Secondary | ICD-10-CM | POA: Diagnosis not present

## 2015-04-03 DIAGNOSIS — C3412 Malignant neoplasm of upper lobe, left bronchus or lung: Secondary | ICD-10-CM | POA: Insufficient documentation

## 2015-04-03 LAB — POCT I-STAT CREATININE: CREATININE: 1.1 mg/dL (ref 0.61–1.24)

## 2015-04-03 MED ORDER — IOHEXOL 300 MG/ML  SOLN
75.0000 mL | Freq: Once | INTRAMUSCULAR | Status: AC | PRN
  Administered 2015-04-03: 75 mL via INTRAVENOUS

## 2015-04-10 ENCOUNTER — Ambulatory Visit: Payer: Medicare Other | Admitting: Radiation Oncology

## 2015-04-10 ENCOUNTER — Ambulatory Visit: Payer: Medicare Other | Admitting: Oncology

## 2015-04-10 ENCOUNTER — Other Ambulatory Visit: Payer: Medicare Other

## 2015-04-10 DIAGNOSIS — C3430 Malignant neoplasm of lower lobe, unspecified bronchus or lung: Secondary | ICD-10-CM | POA: Diagnosis not present

## 2015-04-10 DIAGNOSIS — I10 Essential (primary) hypertension: Secondary | ICD-10-CM | POA: Diagnosis not present

## 2015-04-10 DIAGNOSIS — N400 Enlarged prostate without lower urinary tract symptoms: Secondary | ICD-10-CM | POA: Diagnosis not present

## 2015-04-10 DIAGNOSIS — J449 Chronic obstructive pulmonary disease, unspecified: Secondary | ICD-10-CM | POA: Diagnosis not present

## 2015-04-11 ENCOUNTER — Ambulatory Visit
Admission: RE | Admit: 2015-04-11 | Discharge: 2015-04-11 | Disposition: A | Discharge status: Home / Self Care | Payer: Medicare Other | Source: Ambulatory Visit | Attending: Radiation Oncology | Admitting: Radiation Oncology

## 2015-04-11 ENCOUNTER — Inpatient Hospital Stay (HOSPITAL_BASED_OUTPATIENT_CLINIC_OR_DEPARTMENT_OTHER): Payer: Medicare Other | Admitting: Oncology

## 2015-04-11 ENCOUNTER — Encounter: Payer: Self-pay | Admitting: Oncology

## 2015-04-11 ENCOUNTER — Inpatient Hospital Stay: Payer: Medicare Other | Attending: Oncology

## 2015-04-11 ENCOUNTER — Inpatient Hospital Stay: Payer: Medicare Other

## 2015-04-11 ENCOUNTER — Encounter: Payer: Self-pay | Admitting: Radiation Oncology

## 2015-04-11 VITALS — BP 152/89 | HR 61 | Temp 98.9°F | Resp 18 | Wt 193.1 lb

## 2015-04-11 DIAGNOSIS — Z87891 Personal history of nicotine dependence: Secondary | ICD-10-CM | POA: Diagnosis not present

## 2015-04-11 DIAGNOSIS — C3410 Malignant neoplasm of upper lobe, unspecified bronchus or lung: Secondary | ICD-10-CM

## 2015-04-11 DIAGNOSIS — Z9221 Personal history of antineoplastic chemotherapy: Secondary | ICD-10-CM | POA: Insufficient documentation

## 2015-04-11 DIAGNOSIS — J701 Chronic and other pulmonary manifestations due to radiation: Secondary | ICD-10-CM | POA: Diagnosis not present

## 2015-04-11 DIAGNOSIS — Z85819 Personal history of malignant neoplasm of unspecified site of lip, oral cavity, and pharynx: Secondary | ICD-10-CM

## 2015-04-11 DIAGNOSIS — C3412 Malignant neoplasm of upper lobe, left bronchus or lung: Secondary | ICD-10-CM

## 2015-04-11 DIAGNOSIS — Z79899 Other long term (current) drug therapy: Secondary | ICD-10-CM

## 2015-04-11 DIAGNOSIS — Z85118 Personal history of other malignant neoplasm of bronchus and lung: Secondary | ICD-10-CM | POA: Insufficient documentation

## 2015-04-11 DIAGNOSIS — Z923 Personal history of irradiation: Secondary | ICD-10-CM | POA: Insufficient documentation

## 2015-04-11 DIAGNOSIS — R062 Wheezing: Secondary | ICD-10-CM | POA: Diagnosis not present

## 2015-04-11 DIAGNOSIS — C3490 Malignant neoplasm of unspecified part of unspecified bronchus or lung: Secondary | ICD-10-CM

## 2015-04-11 HISTORY — DX: Malignant neoplasm of upper lobe, unspecified bronchus or lung: C34.10

## 2015-04-11 LAB — COMPREHENSIVE METABOLIC PANEL
ALT: 34 U/L (ref 17–63)
ANION GAP: 9 (ref 5–15)
AST: 31 U/L (ref 15–41)
Albumin: 4.1 g/dL (ref 3.5–5.0)
Alkaline Phosphatase: 71 U/L (ref 38–126)
BILIRUBIN TOTAL: 0.6 mg/dL (ref 0.3–1.2)
BUN: 14 mg/dL (ref 6–20)
CHLORIDE: 103 mmol/L (ref 101–111)
CO2: 23 mmol/L (ref 22–32)
Calcium: 9 mg/dL (ref 8.9–10.3)
Creatinine, Ser: 1 mg/dL (ref 0.61–1.24)
Glucose, Bld: 195 mg/dL — ABNORMAL HIGH (ref 65–99)
POTASSIUM: 4.1 mmol/L (ref 3.5–5.1)
Sodium: 135 mmol/L (ref 135–145)
TOTAL PROTEIN: 7.1 g/dL (ref 6.5–8.1)

## 2015-04-11 LAB — CBC WITH DIFFERENTIAL/PLATELET
BASOS ABS: 0 10*3/uL (ref 0–0.1)
Basophils Relative: 0 %
EOS PCT: 1 %
Eosinophils Absolute: 0.1 10*3/uL (ref 0–0.7)
HEMATOCRIT: 47.2 % (ref 40.0–52.0)
Hemoglobin: 16.4 g/dL (ref 13.0–18.0)
LYMPHS PCT: 7 %
Lymphs Abs: 0.7 10*3/uL — ABNORMAL LOW (ref 1.0–3.6)
MCH: 32.3 pg (ref 26.0–34.0)
MCHC: 34.7 g/dL (ref 32.0–36.0)
MCV: 93 fL (ref 80.0–100.0)
MONO ABS: 0.5 10*3/uL (ref 0.2–1.0)
MONOS PCT: 5 %
NEUTROS ABS: 8.9 10*3/uL — AB (ref 1.4–6.5)
NEUTROS PCT: 87 %
PLATELETS: 265 10*3/uL (ref 150–440)
RBC: 5.08 MIL/uL (ref 4.40–5.90)
RDW: 16.1 % — AB (ref 11.5–14.5)
WBC: 10.2 10*3/uL (ref 3.8–10.6)

## 2015-04-11 NOTE — Progress Notes (Signed)
[place]  Telephone:(336) [phone]  Fax:(336) [phone]     Jason Bryan DOB: 27-Apr-1955  MR#: [id number]  EKC#:[id number]  Patient Care Team: Cletis Athens, MD as PCP - General (Internal Medicine) Christene Lye, MD (General Surgery)  CHIEF COMPLAINT:  Chief Complaint  Patient presents with  . Lung Cancer    INTERVAL HISTORY:  Patient is here for continued follow-up and treatment consideration regarding a stage IIIB non-small cell lung cancer of the left lung. He is approximately one month out from completing XRT, also seen Dr. Baruch Gouty today. He also has a history of locally advanced squamous cell carcinoma of the nasopharynx from 2012. Patient reports overall feeling very well. He denies any fever, chills, shortness of breath, or cough. He does state that he has some intermittent wheezing and that he continues taking Symbicort daily. He had a recent CT scan on 04/03/2015 that showed similar size but reduced marginal definition of the left upper lobe pulmonary nodule. Change in appearance is likely attributable to interval radiation therapy.  REVIEW OF SYSTEMS:   Review of Systems  Constitutional: Negative for fever, chills, weight loss, malaise/fatigue and diaphoresis.  HENT: Negative.   Eyes: Negative.   Respiratory: Positive for wheezing. Negative for cough, hemoptysis, sputum production and shortness of breath.   Cardiovascular: Negative for chest pain, palpitations, orthopnea, claudication, leg swelling and PND.  Gastrointestinal: Negative for heartburn, nausea, vomiting, abdominal pain, diarrhea, constipation, blood in stool and melena.  Genitourinary: Negative.   Musculoskeletal: Negative.   Skin: Negative.   Neurological: Negative for dizziness, tingling, focal weakness, seizures and weakness.  Endo/Heme/Allergies: Does not bruise/bleed easily.  Psychiatric/Behavioral: Negative for depression. The patient is not nervous/anxious and does not have  insomnia.     As per HPI. Otherwise, a complete review of systems is negatve.  ONCOLOGY HISTORY: Oncology History   1.  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  Finished chemotherapy with Taxotere, cisplatinum, 5-FU in February of 2012 3.  Radiation and cetuximab.  Starting in March of 2012 4.  Weekly cetuximab and radiation in May of 2012.  Finished radiation therapy on Jul 24 2010. 5.  Abnormal CT scan of the chest showing multiple mediastinal adenopathy. EBUS is positive for poorly differentiated carcinoma either lung primary versus metastases the disease from nasopharyngeal tumor clinically staged as a primary T1, N2, M0 tumor stage III B (April of 2015) 6.  Started with carboplatinum and Taxol July 07, 2013 7.  Patient had good response to carboplatinum and Taxol.  Now being referred to radiation and concurrent carboplatin (June of 2015) 8. 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 Cancer ([place]) 2011    squemous stage 3  . Squamous cell lung cancer ([place]) 2015    Right Lung CA with chemo + rad tx's.  . Lung cancer, upper lobe ([employer]) 04/11/2015    PAST SURGICAL HISTORY: No past surgical history on file.  FAMILY HISTORY Family History  Problem Relation Age of Onset  . Cancer Mother     GYNECOLOGIC HISTORY:  No LMP for male patient.     ADVANCED DIRECTIVES:    HEALTH  MAINTENANCE: Social History  Substance Use Topics  . Smoking status: Former Smoker -- 1.00 packs/day for 40 years    Types: Cigarettes  . Smokeless tobacco: Never Used  . Alcohol Use: No     Colonoscopy:  PAP:  Bone  density:  Lipid panel:  No Known Allergies  Current Outpatient Prescriptions  Medication Sig Dispense Refill  . budesonide-formoterol (SYMBICORT) 160-4.5 MCG/ACT inhaler Inhale 1 puff into the lungs daily. 1 Inhaler 3  . temazepam (RESTORIL) 30 MG capsule Take 1 capsule (30 mg total) by mouth at bedtime as needed for sleep. 90 capsule 0  . traZODone (DESYREL) 50 MG tablet Take 1 tablet by mouth as needed.    Marland Kitchen lisinopril (PRINIVIL,ZESTRIL) 10 MG tablet      No current facility-administered medications for this visit.    OBJECTIVE: BP 152/89 mmHg  Pulse 61  Temp(Src) 98.9 F (37.2 C)  Resp 18  Wt 193 lb 2 oz (87.6 kg)   Body mass index is 32.14 kg/(m^2).    ECOG FS:0 - Asymptomatic  General: Well-developed, well-nourished, no acute distress. Eyes: Pink conjunctiva, anicteric sclera. HEENT: Normocephalic, moist mucous membranes, clear oropharnyx. Lungs: Occasional bilateral wheezing noted. Otherwise good air entry.  Heart: Regular rate and rhythm. No rubs, murmurs, or gallops. Abdomen: Soft, nontender, nondistended. No organomegaly noted, normoactive bowel sounds. Musculoskeletal: No edema, cyanosis, or clubbing. Neuro: Alert, answering all questions appropriately. Cranial nerves grossly intact. Skin: No rashes or petechiae noted. Psych: Normal affect. Lymphatics: No cervical, clavicular LAD.   LAB RESULTS:  Appointment on 04/11/2015  Component Date Value Ref Range Status  . WBC 04/11/2015 10.2  3.8 - 10.6 K/uL Final  . RBC 04/11/2015 5.08  4.40 - 5.90 MIL/uL Final  . Hemoglobin 04/11/2015 16.4  13.0 - 18.0 g/dL Final  . HCT 04/11/2015 47.2  40.0 - 52.0 % Final  . MCV 04/11/2015 93.0  80.0 - 100.0 fL Final  . MCH 04/11/2015 32.3  26.0 - 34.0 pg Final  . MCHC 04/11/2015 34.7  32.0 - 36.0 g/dL Final  . RDW 04/11/2015 16.1* 11.5 - 14.5 % Final  . Platelets 04/11/2015 265  150 - 440 K/uL Final  . Neutrophils Relative % 04/11/2015 87   Final  . Neutro Abs 04/11/2015 8.9*  1.4 - 6.5 K/uL Final  . Lymphocytes Relative 04/11/2015 7   Final  . Lymphs Abs 04/11/2015 0.7* 1.0 - 3.6 K/uL Final  . Monocytes Relative 04/11/2015 5   Final  . Monocytes Absolute 04/11/2015 0.5  0.2 - 1.0 K/uL Final  . Eosinophils Relative 04/11/2015 1   Final  . Eosinophils Absolute 04/11/2015 0.1  0 - 0.7 K/uL Final  . Basophils Relative 04/11/2015 0   Final  . Basophils Absolute 04/11/2015 0.0  0 - 0.1 K/uL Final  . Sodium 04/11/2015 135  135 - 145 mmol/L Final  . Potassium 04/11/2015 4.1  3.5 - 5.1 mmol/L Final  . Chloride 04/11/2015 103  101 - 111 mmol/L Final  . CO2 04/11/2015 23  22 - 32 mmol/L Final  . Glucose, Bld 04/11/2015 195* 65 - 99 mg/dL Final  . BUN 04/11/2015 14  6 - 20 mg/dL Final  . Creatinine, Ser 04/11/2015 1.00  0.61 - 1.24 mg/dL Final  . Calcium 04/11/2015 9.0  8.9 - 10.3 mg/dL Final  . Total Protein 04/11/2015 7.1  6.5 - 8.1 g/dL Final  . Albumin 04/11/2015 4.1  3.5 - 5.0 g/dL Final  . AST 04/11/2015 31  15 - 41 U/L Final  .  ALT 04/11/2015 34  17 - 63 U/L Final  . Alkaline Phosphatase 04/11/2015 71  38 - 126 U/L Final  . Total Bilirubin 04/11/2015 0.6  0.3 - 1.2 mg/dL Final  . GFR calc non Af Amer 04/11/2015 >60  >60 mL/min Final  . GFR calc Af Amer 04/11/2015 >60  >60 mL/min Final   Comment: (NOTE) The eGFR has been calculated using the CKD EPI equation. This calculation has not been validated in all clinical situations. eGFR's persistently <60 mL/min signify possible Chronic Kidney Disease.   . Anion gap 04/11/2015 9  5 - 15 Final    STUDIES: No results found.  ASSESSMENT:  1. Squamous cell carcinoma of nasopharynx. 2. Lung cancer, squamous cell carcinoma poorly differentiated metastases to lymph.   PLAN:   1. Squamous cell carcinoma nasopharynx. Locally advanced with metastasis to the lymph node, stage IV disease. He is status post chemoradiation. Clinically there is no evidence of recurrent disease. 2. Squamous cell lung cancer. Patient is  status post chemotherapy therapy as well as radiation. CT scan on 04/03/2015 noted to have similar size but reduced marginal definition of the left upper lobe pulmonary nodule. Adjacent bandlike density in the left upper lobe. This changed in appearance as likely attributable to interval radiation therapy. Also noted to have a stable bilateral perihilar and paramediastinal fibrosis related to prior therapy. Discussed results of CT scan with Dr. Oliva Bustard, he is in agreement with routine follow-up.  We'll continue with routine follow-up in approximately 4 months.  Patient expressed understanding and was in agreement with this plan. He also understands that He can call clinic at any time with any questions, concerns, or complaints.   Dr. Oliva Bustard was available for consultation and review of plan of care for this patient.  Evlyn Kanner, NP   04/11/2015 10:28 AM

## 2015-04-11 NOTE — Progress Notes (Signed)
Radiation Oncology Follow up Note  Name: Jason Bryan   Date:   04/11/2015 MRN:  [id number] DOB: May 02, 1955    This 60 y.o. male presents to the clinic today for follow-up for SB RT to left lung nodule now out 5 months.  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 He is now 1 month out having completed SB RT to a new left lung nodule. He's also had radiation to his chest prior for stage IIIB non-small cell lung cancer he is seen today in routine follow-up is doing well. Recent CT scan of his chest shows excellent response to treatment with no evidence of disease. He specifically denies cough hemoptysis or chest tightness..  COMPLICATIONS OF TREATMENT: none  FOLLOW UP COMPLIANCE: keeps appointments   PHYSICAL EXAM:  There were no vitals taken for this visit. Well-developed well-nourished patient in NAD. HEENT reveals PERLA, EOMI, discs not visualized.  Oral cavity is clear. No oral mucosal lesions are identified. Neck is clear without evidence of cervical or supraclavicular adenopathy. Lungs are clear to A&P. Cardiac examination is essentially unremarkable with regular rate and rhythm without murmur rub or thrill. Abdomen is benign with no organomegaly or masses noted. Motor sensory and DTR levels are equal and symmetric in the upper and lower extremities. Cranial nerves II through XII are grossly intact. Proprioception is intact. No peripheral adenopathy or edema is identified. No motor or sensory levels are noted. Crude visual fields are within normal range.  RADIOLOGY RESULTS: Serial CT scans are reviewed compatible with the above-stated findings  PLAN: Present time he is doing extremely well with no evidence of disease I'm please was overall progress. I've asked to see him back in 6 months for follow-up. Will obtain a follow-up CT scan prior  to that visit for review. Patient knows to call sooner with any concerns. I would like to take this opportunity for allowing me to participate in the care of your patient.Armstead Peaks., MD

## 2015-04-11 NOTE — Progress Notes (Signed)
Patient here today for PET results.  States he saw PCP yesterday.  BP has been running high.  Started on Lisiniopril 10 mg daily.

## 2015-04-20 DIAGNOSIS — C3490 Malignant neoplasm of unspecified part of unspecified bronchus or lung: Secondary | ICD-10-CM | POA: Diagnosis not present

## 2015-04-20 DIAGNOSIS — R278 Other lack of coordination: Secondary | ICD-10-CM | POA: Diagnosis not present

## 2015-04-20 DIAGNOSIS — R5381 Other malaise: Secondary | ICD-10-CM | POA: Diagnosis not present

## 2015-05-01 DIAGNOSIS — C3490 Malignant neoplasm of unspecified part of unspecified bronchus or lung: Secondary | ICD-10-CM | POA: Diagnosis not present

## 2015-05-01 DIAGNOSIS — J209 Acute bronchitis, unspecified: Secondary | ICD-10-CM | POA: Diagnosis not present

## 2015-05-30 ENCOUNTER — Other Ambulatory Visit: Payer: Self-pay | Admitting: *Deleted

## 2015-05-30 MED ORDER — TEMAZEPAM 30 MG PO CAPS: 30.0000 mg | ORAL_CAPSULE | Freq: Every evening | ORAL | Status: DC | PRN

## 2015-06-16 ENCOUNTER — Other Ambulatory Visit: Payer: Self-pay | Admitting: Oncology

## 2015-06-19 DIAGNOSIS — C3490 Malignant neoplasm of unspecified part of unspecified bronchus or lung: Secondary | ICD-10-CM | POA: Diagnosis not present

## 2015-06-19 DIAGNOSIS — R278 Other lack of coordination: Secondary | ICD-10-CM | POA: Diagnosis not present

## 2015-07-13 ENCOUNTER — Ambulatory Visit: Payer: Medicare Other | Admitting: Oncology

## 2015-07-13 ENCOUNTER — Other Ambulatory Visit: Payer: Medicare Other

## 2015-08-04 ENCOUNTER — Other Ambulatory Visit: Payer: Self-pay | Admitting: Family Medicine

## 2015-08-08 ENCOUNTER — Inpatient Hospital Stay: Payer: Medicare Other | Admitting: Oncology

## 2015-08-08 ENCOUNTER — Inpatient Hospital Stay: Payer: Medicare Other

## 2015-08-09 ENCOUNTER — Inpatient Hospital Stay (HOSPITAL_BASED_OUTPATIENT_CLINIC_OR_DEPARTMENT_OTHER): Payer: Medicare Other | Admitting: Family Medicine

## 2015-08-09 ENCOUNTER — Inpatient Hospital Stay: Payer: Medicare Other | Attending: Family Medicine

## 2015-08-09 VITALS — BP 145/74 | HR 60 | Resp 18 | Wt 200.0 lb

## 2015-08-09 DIAGNOSIS — Z87891 Personal history of nicotine dependence: Secondary | ICD-10-CM | POA: Insufficient documentation

## 2015-08-09 DIAGNOSIS — R093 Abnormal sputum: Secondary | ICD-10-CM | POA: Diagnosis not present

## 2015-08-09 DIAGNOSIS — Z9221 Personal history of antineoplastic chemotherapy: Secondary | ICD-10-CM | POA: Diagnosis not present

## 2015-08-09 DIAGNOSIS — R062 Wheezing: Secondary | ICD-10-CM

## 2015-08-09 DIAGNOSIS — Z85818 Personal history of malignant neoplasm of other sites of lip, oral cavity, and pharynx: Secondary | ICD-10-CM | POA: Diagnosis not present

## 2015-08-09 DIAGNOSIS — J8410 Pulmonary fibrosis, unspecified: Secondary | ICD-10-CM | POA: Insufficient documentation

## 2015-08-09 DIAGNOSIS — Z79899 Other long term (current) drug therapy: Secondary | ICD-10-CM | POA: Diagnosis not present

## 2015-08-09 DIAGNOSIS — R05 Cough: Secondary | ICD-10-CM | POA: Insufficient documentation

## 2015-08-09 DIAGNOSIS — C3412 Malignant neoplasm of upper lobe, left bronchus or lung: Secondary | ICD-10-CM

## 2015-08-09 DIAGNOSIS — J701 Chronic and other pulmonary manifestations due to radiation: Secondary | ICD-10-CM | POA: Insufficient documentation

## 2015-08-09 DIAGNOSIS — Z85118 Personal history of other malignant neoplasm of bronchus and lung: Secondary | ICD-10-CM

## 2015-08-09 DIAGNOSIS — Z923 Personal history of irradiation: Secondary | ICD-10-CM | POA: Insufficient documentation

## 2015-08-09 LAB — CBC WITH DIFFERENTIAL/PLATELET
BASOS ABS: 0 10*3/uL (ref 0–0.1)
BASOS PCT: 0 %
EOS ABS: 0.1 10*3/uL (ref 0–0.7)
Eosinophils Relative: 1 %
HCT: 46.8 % (ref 40.0–52.0)
Hemoglobin: 16.2 g/dL (ref 13.0–18.0)
Lymphocytes Relative: 11 %
Lymphs Abs: 1 10*3/uL (ref 1.0–3.6)
MCH: 33.2 pg (ref 26.0–34.0)
MCHC: 34.7 g/dL (ref 32.0–36.0)
MCV: 95.5 fL (ref 80.0–100.0)
MONO ABS: 0.8 10*3/uL (ref 0.2–1.0)
MONOS PCT: 9 %
NEUTROS PCT: 79 %
Neutro Abs: 7 10*3/uL — ABNORMAL HIGH (ref 1.4–6.5)
PLATELETS: 266 10*3/uL (ref 150–440)
RBC: 4.9 MIL/uL (ref 4.40–5.90)
RDW: 15 % — AB (ref 11.5–14.5)
WBC: 8.9 10*3/uL (ref 3.8–10.6)

## 2015-08-09 LAB — COMPREHENSIVE METABOLIC PANEL
ALBUMIN: 4.2 g/dL (ref 3.5–5.0)
ALK PHOS: 59 U/L (ref 38–126)
ALT: 45 U/L (ref 17–63)
ANION GAP: 6 (ref 5–15)
AST: 31 U/L (ref 15–41)
BILIRUBIN TOTAL: 0.6 mg/dL (ref 0.3–1.2)
BUN: 17 mg/dL (ref 6–20)
CALCIUM: 8.9 mg/dL (ref 8.9–10.3)
CO2: 30 mmol/L (ref 22–32)
Chloride: 102 mmol/L (ref 101–111)
Creatinine, Ser: 1.12 mg/dL (ref 0.61–1.24)
GFR calc Af Amer: >60 mL/min (ref >60)
GFR calc non Af Amer: >60 mL/min (ref >60)
GLUCOSE: 79 mg/dL (ref 65–99)
Potassium: 3.8 mmol/L (ref 3.5–5.1)
SODIUM: 138 mmol/L (ref 135–145)
TOTAL PROTEIN: 7.2 g/dL (ref 6.5–8.1)

## 2015-08-09 NOTE — Progress Notes (Signed)
[facility]  [phone]  [phone]     Jason Bryan DOB: 1955/11/18  MR#: [id number]  LFY#:[id number]  Patient Care Team: Cletis Athens, MD as PCP - General (Internal Medicine) Christene Lye, MD (General Surgery)  CHIEF COMPLAINT:  Chief Complaint  Patient presents with  . Lung Cancer    follow up    INTERVAL HISTORY:  Patient is here for continued follow-up and treatment consideration regarding a stage IIIB non-small cell lung cancer of the left lung. He is approximately six months out from completing XRT. 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 Patient reports overall feeling very well. He denies any fever, chills, shortness of breath, or cough. He does state that he has some intermittent clear sputum production. He had a recent CT scan on 04/03/2015 that showed similar size but reduced marginal definition of the left upper lobe pulmonary nodule. Change in appearance is likely attributable to interval radiation therapy.  REVIEW OF SYSTEMS:   Review of Systems  Constitutional: Negative for fever, chills, weight loss, malaise/fatigue and diaphoresis.  HENT: Negative.   Eyes: Negative.   Respiratory: Positive for cough and sputum production. Negative for hemoptysis, shortness of breath and wheezing.        Clear phlegm  Cardiovascular: Negative for chest pain, palpitations, orthopnea, claudication, leg swelling and PND.  Gastrointestinal: Negative for heartburn, nausea, vomiting, abdominal pain, diarrhea, constipation, blood in stool and melena.  Genitourinary: Negative.   Musculoskeletal: Negative.   Skin: Negative.   Neurological: Negative for dizziness, tingling, focal weakness, seizures and weakness.  Endo/Heme/Allergies: Does not bruise/bleed easily.  Psychiatric/Behavioral: Negative for depression. The patient is not nervous/anxious and does not have insomnia.     As per  HPI. Otherwise, a complete review of systems is negatve.  ONCOLOGY HISTORY: Oncology History   1.  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  Finished chemotherapy with Taxotere, cisplatinum, 5-FU in February of 2012 3.  Radiation and cetuximab.  Starting in March of 2012 4.  Weekly cetuximab and radiation in May of 2012.  Finished radiation therapy on Jul 24 2010. 5.  Abnormal CT scan of the chest showing multiple mediastinal adenopathy. EBUS is positive for poorly differentiated carcinoma either lung primary versus metastases the disease from nasopharyngeal tumor clinically staged as a primary T1, N2, M0 tumor stage III B (April of 2015) 6.  Started with carboplatinum and Taxol July 07, 2013 7.  Patient had good response to carboplatinum and Taxol.  Now being referred to radiation and concurrent carboplatin (June of 2015) 8. 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 Cancer ([place]) 2011    squemous stage 3  . Squamous cell lung cancer ([place]) 2015    Right Lung CA with chemo + rad tx's.  . Lung cancer, upper lobe ([place]) 04/11/2015    PAST SURGICAL HISTORY: No past surgical history on file.  FAMILY HISTORY Family History  Problem Relation Age of Onset  . Cancer Mother     GYNECOLOGIC HISTORY:  No LMP for male patient.     ADVANCED DIRECTIVES:  HEALTH MAINTENANCE: Social History  Substance Use Topics  . Smoking status: Former Smoker -- 1.00 packs/day for 40 years    Types: Cigarettes  . Smokeless tobacco: Never Used  . Alcohol Use: No     No Known Allergies  Current Outpatient Prescriptions    Medication Sig Dispense Refill  . budesonide-formoterol (SYMBICORT) 160-4.5 MCG/ACT inhaler Inhale 1 puff into the lungs daily. 1 Inhaler 3  . lisinopril (PRINIVIL,ZESTRIL) 10 MG tablet     . temazepam (RESTORIL) 30 MG capsule TAKE 1 CAPSULE AT BEDTIME AS NEEDED FOR SLEEP 90 capsule 0  . traZODone (DESYREL) 50 MG tablet TAKE 1 TABLET AT BEDTIME AS NEEDED 90 tablet 3   No current facility-administered medications for this visit.    OBJECTIVE: BP 145/74 mmHg  Pulse 60  Resp 18  Wt 199 lb 15.3 oz (90.7 kg)   Body mass index is 33.27 kg/(m^2).    ECOG FS:0 - Asymptomatic  General: Well-developed, well-nourished, no acute distress. Eyes: Pink conjunctiva, anicteric sclera. HEENT: Normocephalic, moist mucous membranes, clear oropharnyx. Lungs: Occasional bilateral wheezing noted. Otherwise good air entry.  Heart: Regular rate and rhythm. No rubs, murmurs, or gallops. Abdomen: Soft, nontender, nondistended. No organomegaly noted, normoactive bowel sounds. Musculoskeletal: No edema, cyanosis, or clubbing. Neuro: Alert, answering all questions appropriately. Cranial nerves grossly intact. Skin: No rashes or petechiae noted. Psych: Normal affect. Lymphatics: No cervical, clavicular LAD.   LAB RESULTS:  Appointment on 08/09/2015  Component Date Value Ref Range Status  . WBC 08/09/2015 8.9  3.8 - 10.6 K/uL Final  . RBC 08/09/2015 4.90  4.40 - 5.90 MIL/uL Final  . Hemoglobin 08/09/2015 16.2  13.0 - 18.0 g/dL Final  . HCT 08/09/2015 46.8  40.0 - 52.0 % Final  . MCV 08/09/2015 95.5  80.0 - 100.0 fL Final  . MCH 08/09/2015 33.2  26.0 - 34.0 pg Final  . MCHC 08/09/2015 34.7  32.0 - 36.0 g/dL Final  . RDW 08/09/2015 15.0* 11.5 - 14.5 % Final  . Platelets 08/09/2015 266  150 - 440 K/uL Final  . Neutrophils Relative % 08/09/2015 79   Final  . Neutro Abs 08/09/2015 7.0* 1.4 - 6.5 K/uL Final  . Lymphocytes Relative 08/09/2015 11   Final  . Lymphs Abs 08/09/2015 1.0  1.0 - 3.6 K/uL Final  .  Monocytes Relative 08/09/2015 9   Final  . Monocytes Absolute 08/09/2015 0.8  0.2 - 1.0 K/uL Final  . Eosinophils Relative 08/09/2015 1   Final  . Eosinophils Absolute 08/09/2015 0.1  0 - 0.7 K/uL Final  . Basophils Relative 08/09/2015 0   Final  . Basophils Absolute 08/09/2015 0.0  0 - 0.1 K/uL Final  . Sodium 08/09/2015 138  135 - 145 mmol/L Final  . Potassium 08/09/2015 3.8  3.5 - 5.1 mmol/L Final  . Chloride 08/09/2015 102  101 - 111 mmol/L Final  . CO2 08/09/2015 30  22 - 32 mmol/L Final  . Glucose, Bld 08/09/2015 79  65 - 99 mg/dL Final  . BUN 08/09/2015 17  6 - 20 mg/dL Final  . Creatinine, Ser 08/09/2015 1.12  0.61 - 1.24 mg/dL Final  . Calcium 08/09/2015 8.9  8.9 - 10.3 mg/dL Final  . Total Protein 08/09/2015 7.2  6.5 - 8.1 g/dL Final  . Albumin 08/09/2015 4.2  3.5 - 5.0 g/dL Final  . AST 08/09/2015 31  15 - 41 U/L Final  . ALT 08/09/2015 45  17 - 63 U/L Final  . Alkaline Phosphatase 08/09/2015  59  38 - 126 U/L Final  . Total Bilirubin 08/09/2015 0.6  0.3 - 1.2 mg/dL Final  . GFR calc non Af Amer 08/09/2015 >60  >60 mL/min Final  . GFR calc Af Amer 08/09/2015 >60  >60 mL/min Final   Comment: (NOTE) The eGFR has been calculated using the CKD EPI equation. This calculation has not been validated in all clinical situations. eGFR's persistently <60 mL/min signify possible Chronic Kidney Disease.   . Anion gap 08/09/2015 6  5 - 15 Final    STUDIES: No results found.  ASSESSMENT:  1. Squamous cell carcinoma of nasopharynx. 2. Lung cancer, squamous cell carcinoma poorly differentiated metastases to lymph.   PLAN:   1. Squamous cell carcinoma nasopharynx. Locally advanced with metastasis to the lymph node, stage IV disease, 2012. He is status post chemoradiation. Clinically there is no evidence of recurrent disease. 2. Squamous cell lung cancer. Patient is status post chemotherapy as well as radiation. CT scan on 04/03/2015 noted to have similar size but reduced marginal  definition of the left upper lobe pulmonary nodule. Adjacent bandlike density in the left upper lobe. This changed in appearance is likely attributable to interval radiation therapy. Also noted to have a stable bilateral perihilar and paramediastinal fibrosis related to prior therapy. Clinically there is no evidence of progressive disease. Patient will require another follow-up CT scan prior to his next follow-up appointment in approximately 4 months.  Patient expressed understanding and was in agreement with this plan. He also understands that He can call clinic at any time with any questions, concerns, or complaints.   Dr. Oliva Bustard was available for consultation and review of plan of care for this patient.  Evlyn Kanner, NP   08/09/2015 4:20 PM

## 2015-08-24 ENCOUNTER — Other Ambulatory Visit: Payer: Self-pay | Admitting: Family Medicine

## 2015-08-24 ENCOUNTER — Telehealth: Payer: Self-pay | Admitting: *Deleted

## 2015-08-24 NOTE — Telephone Encounter (Signed)
Refill not due until August

## 2015-08-31 ENCOUNTER — Other Ambulatory Visit: Payer: Self-pay | Admitting: Oncology

## 2015-09-07 ENCOUNTER — Other Ambulatory Visit: Payer: Self-pay | Admitting: *Deleted

## 2015-09-07 NOTE — Telephone Encounter (Signed)
Called to report that pharmacy states they did not receive rx for temazepam refill 6/13, I called them and gave verbal

## 2015-10-20 ENCOUNTER — Encounter: Payer: Self-pay | Admitting: Radiation Oncology

## 2015-10-20 ENCOUNTER — Ambulatory Visit
Admission: RE | Admit: 2015-10-20 | Discharge: 2015-10-20 | Disposition: A | Discharge status: Home / Self Care | Payer: Medicare Other | Source: Ambulatory Visit | Attending: Radiation Oncology | Admitting: Radiation Oncology

## 2015-10-20 VITALS — BP 150/82 | HR 57 | Temp 99.0°F | Wt 205.8 lb

## 2015-10-20 DIAGNOSIS — Z08 Encounter for follow-up examination after completed treatment for malignant neoplasm: Secondary | ICD-10-CM | POA: Diagnosis not present

## 2015-10-20 DIAGNOSIS — C3412 Malignant neoplasm of upper lobe, left bronchus or lung: Secondary | ICD-10-CM

## 2015-10-20 DIAGNOSIS — Z8522 Personal history of malignant neoplasm of nasal cavities, middle ear, and accessory sinuses: Secondary | ICD-10-CM | POA: Insufficient documentation

## 2015-10-20 DIAGNOSIS — Z923 Personal history of irradiation: Secondary | ICD-10-CM | POA: Insufficient documentation

## 2015-10-20 DIAGNOSIS — Z85118 Personal history of other malignant neoplasm of bronchus and lung: Secondary | ICD-10-CM | POA: Insufficient documentation

## 2015-10-20 NOTE — Progress Notes (Signed)
Radiation Oncology Follow up Note  Name: Jason Bryan   Date:   10/20/2015 MRN:  [id number] DOB: 04-Sep-1955    This 60 y.o. male presents to the clinic today for 1 year follow-up status post SB RT to left lung.  REFERRING PROVIDER: Cletis Athens, MD  HPI: Patient is a 60 year old male now out close to year having completed SB RT to his left lung. 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 He is seen today in follow-up and is doing well specifically denies head and neck pain dysphasia cough or any other pulmonary symptoms. He had a CT scan back in. January 2017 showing good response SB RT with decreased size of the nodule. He has a follow-up CT scan 2 weeks from now.  COMPLICATIONS OF TREATMENT: none  FOLLOW UP COMPLIANCE: keeps appointments   PHYSICAL EXAM:  BP (!) 150/82   Pulse (!) 57   Temp 99 F (37.2 C)   Wt 205 lb 12.8 oz (93.3 kg)   BMI 34.25 kg/m  Well-developed well-nourished patient in NAD. HEENT reveals PERLA, EOMI, discs not visualized.  Oral cavity is clear. No oral mucosal lesions are identified. Neck is clear without evidence of cervical or supraclavicular adenopathy. Lungs are clear to A&P. Cardiac examination is essentially unremarkable with regular rate and rhythm without murmur rub or thrill. Abdomen is benign with no organomegaly or masses noted. Motor sensory and DTR levels are equal and symmetric in the upper and lower extremities. Cranial nerves II through XII are grossly intact. Proprioception is intact. No peripheral adenopathy or edema is identified. No motor or sensory levels are noted. Crude visual fields are within normal range.  RADIOLOGY RESULTS: Most recent CT scan is reviewed compatible above-stated findings. I also asked to review his CT scan to be done in the next 2 weeks.  PLAN: Present time he is doing well no evidence of disease from  both the nasopharynx as well as lung standpoint. I'm please was overall progress. I will review his CT scan which she has performed the next 2 weeks. I will see him back in 6 months for follow-up. Patient knows to call sooner with any concerns.  I would like to take this opportunity to thank you for allowing me to participate in the care of your patient.Armstead Peaks., MD

## 2015-10-22 IMAGING — CT CT CHEST W/ CM
2 of 3 series · 15 of 36 positions shown, 18 images · IV contrast (isovue)
Comparison: DG CHEST 2V dated 06/02/2013. CT images from PET
06/11/2011 are not available for comparison.

CLINICAL DATA: Cough and fever. Lung nodule. Abnormal chest
radiograph. History of nasopharyngeal squamous cell carcinoma.

EXAM:
CT CHEST WITH CONTRAST
TECHNIQUE: Multidetector CT imaging of the chest was performed during
intravenous contrast administration.
CONTRAST:  75 cc Isovue 370.

[Series 2: routine chest with · axial · 0.71mm/px · z∈[-744,-459]mm · 12 of 69 slices shown, 15 images]
[im 6/69  mediastinal]
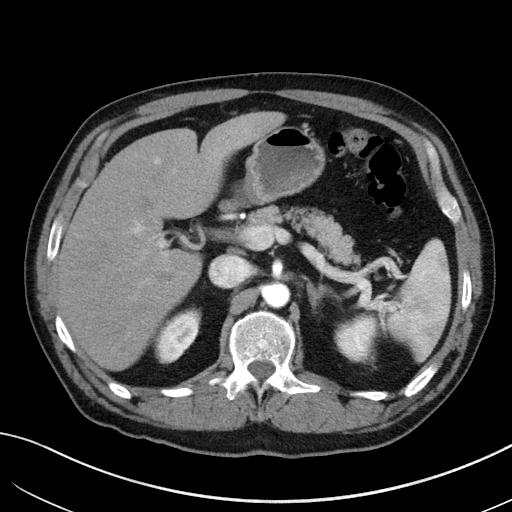
[im 6/69  lung]
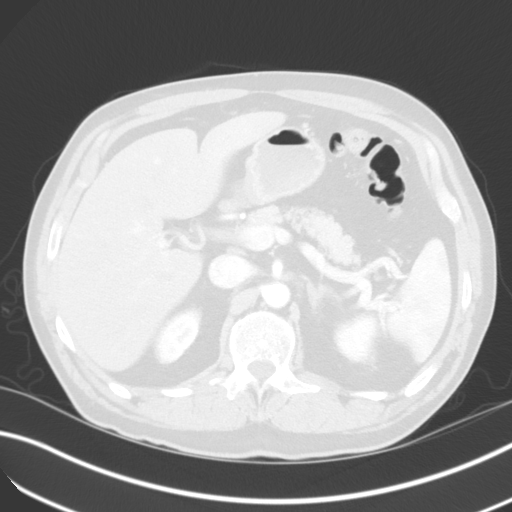
[im 11/69  lung]
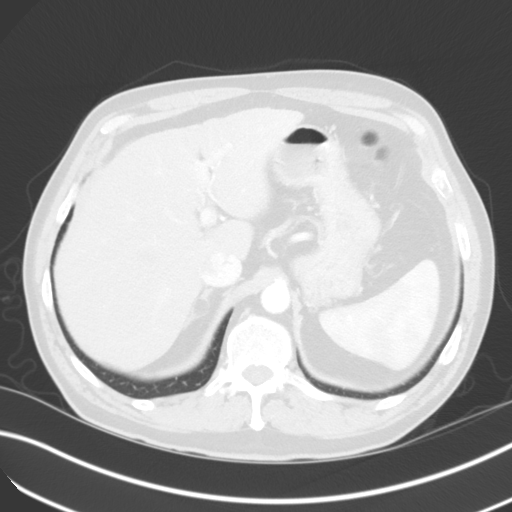
[im 16/69  lung]
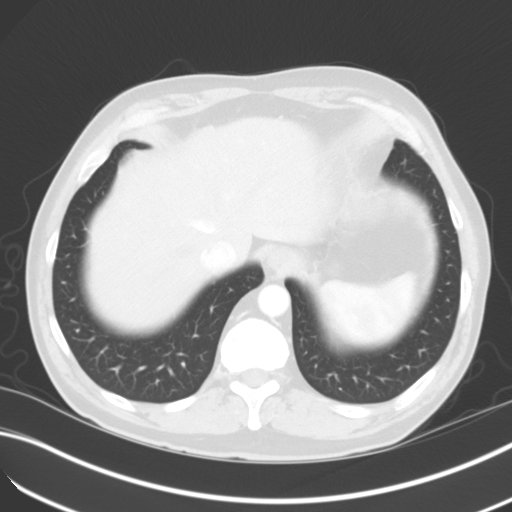
[im 21/69  lung]
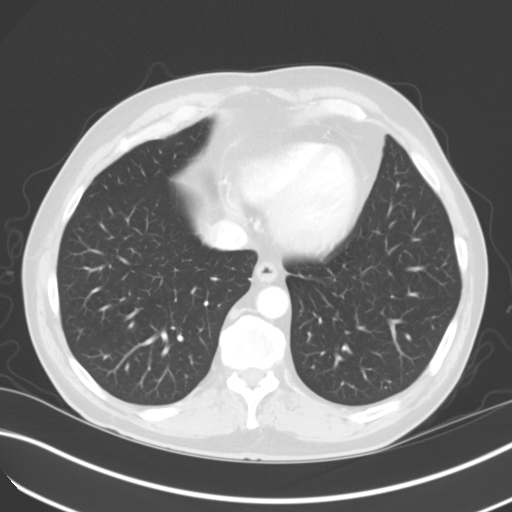
[im 26/69  mediastinal]
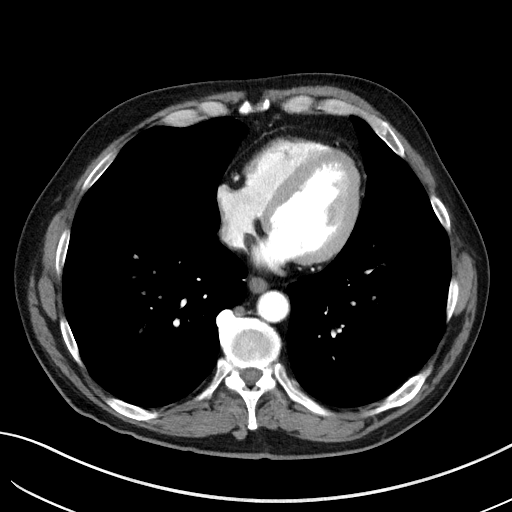
[im 26/69  lung]
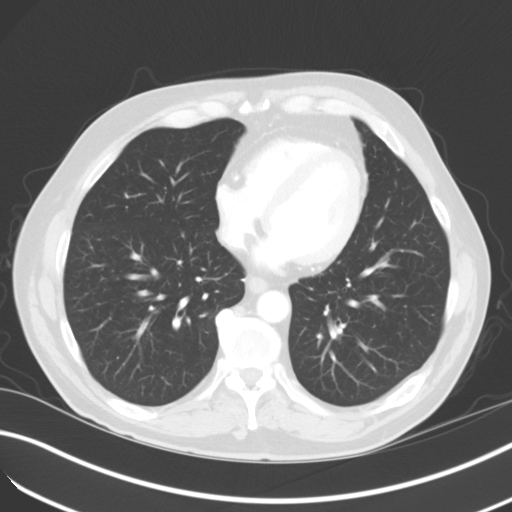
[im 31/69  lung]
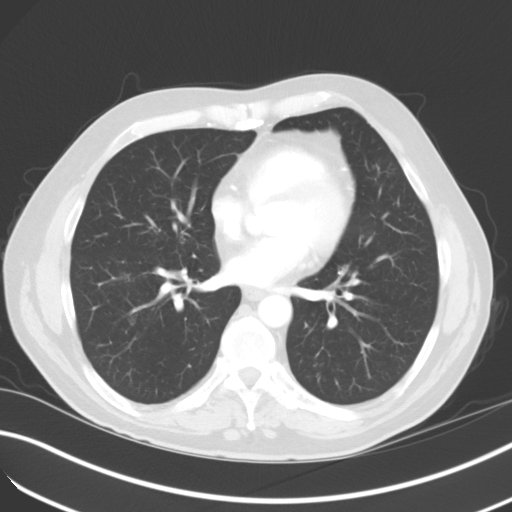
[im 38/69  lung]
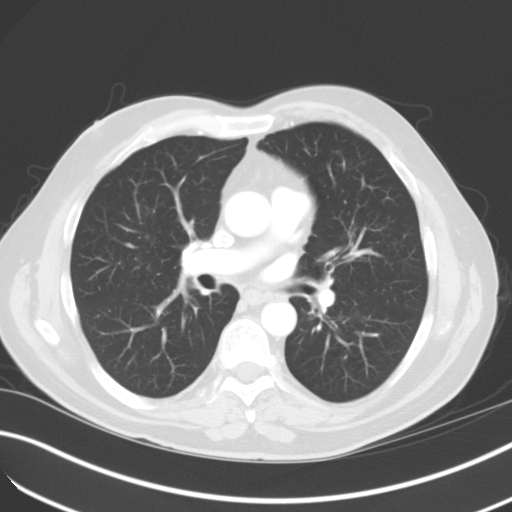
[im 43/69  lung]
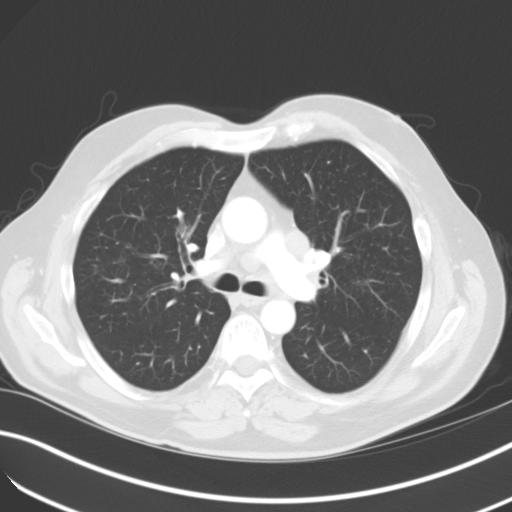
[im 48/69  mediastinal]
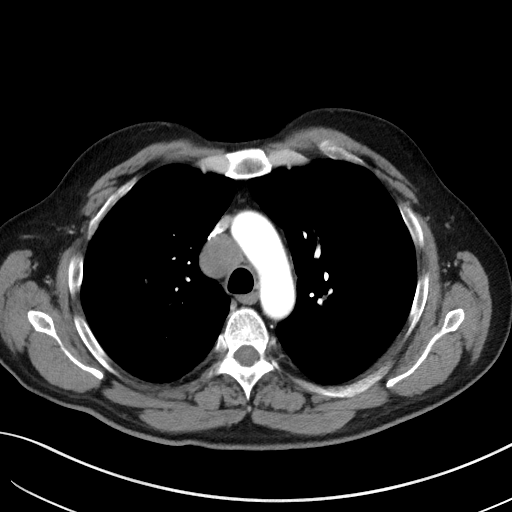
[im 48/69  lung]
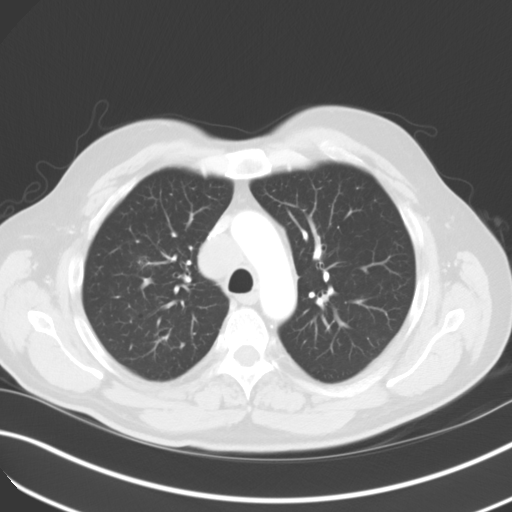
[im 53/69  lung]
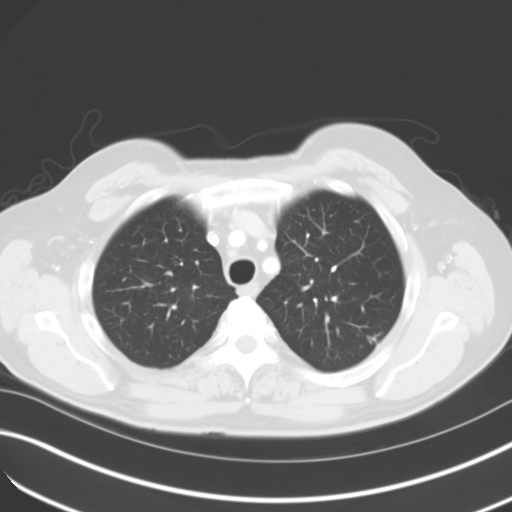
[im 58/69  lung]
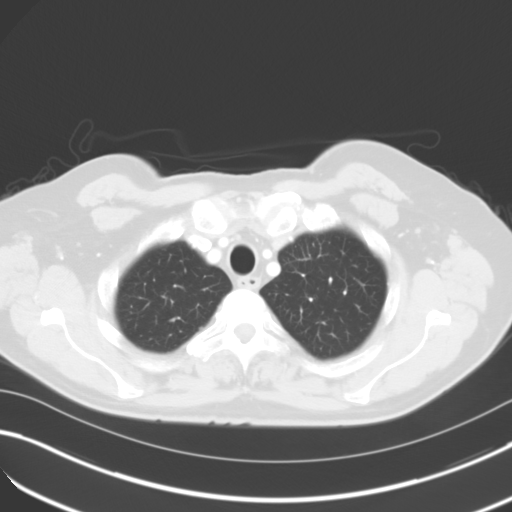
[im 63/69  lung]
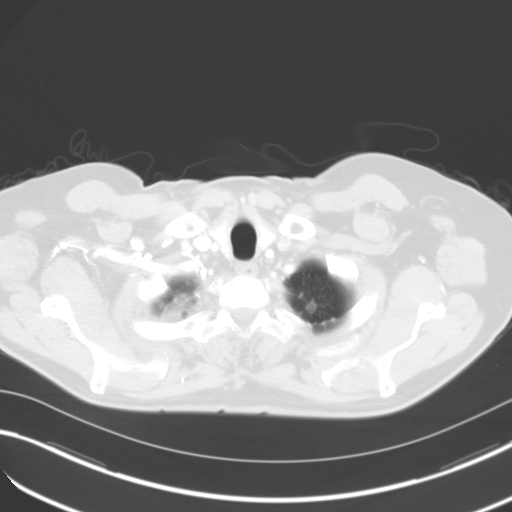

[Series 5: cor routine chest with · coronal · 0.68mm/px · 3 of 136 slices shown]
[im 28/136  lung]
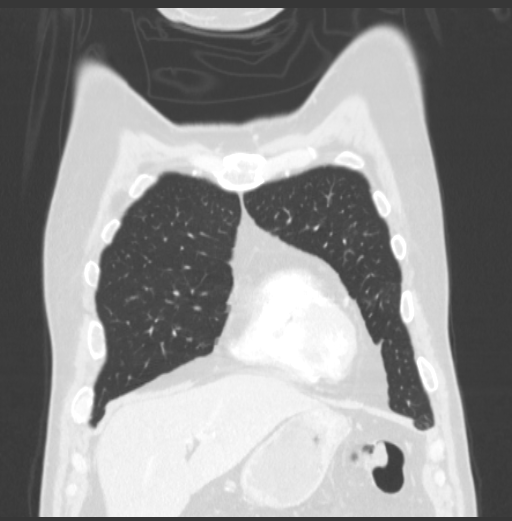
[im 55/136  lung]
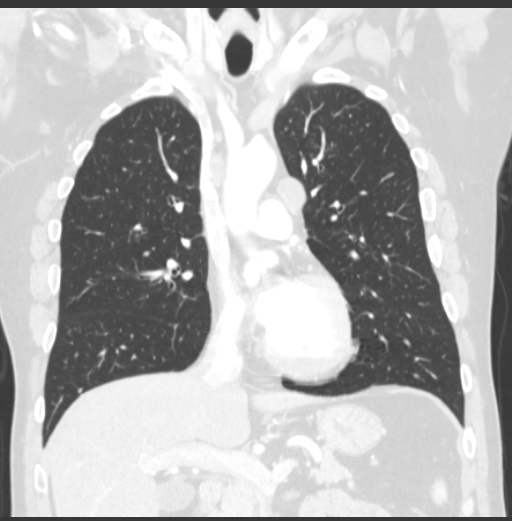
[im 82/136  lung]
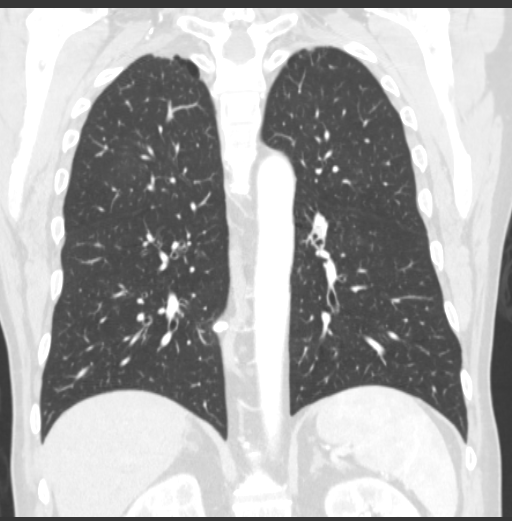

[15 of 36 positions shown; findings below may reference images not displayed]

FINDINGS: Bulky mediastinal adenopathy measures up to 2.7 x 3.3 cm in the
lower right paratracheal station. Left suprahilar adenopathy
measures up to 1.5 cm in short axis. No axillary adenopathy. Heart
size normal. No pericardial effusion.

Biapical pleural parenchymal scarring. A spiculated nodular lesion
in the left upper lobe measures 9 x 9 mm (image 19). 4 mm left upper
lobe nodule (image 28). Vague areas of peribronchovascular
ground-glass in the right lung, nonspecific. Scattered areas of mild
bronchiectasis No pleural fluid. Airway is otherwise unremarkable.

Incidental imaging of the upper abdomen shows an irregular liver
contour. Visualized portions of the liver are otherwise
unremarkable. A 1.1 x 1.4 cm fat density lesion in the right adrenal
gland. Visualized portions of the left adrenal gland, kidneys,
spleen, pancreas, stomach and bowel are grossly unremarkable. No
worrisome lytic or sclerotic lesions.
IMPRESSION: 1. Bulky mediastinal and left suprahilar adenopathy, worrisome for
small cell carcinoma. The primary lesion may be in the left upper
lobe. These results will be called to the ordering clinician or
representative by the Radiologist Assistant, and communication
documented in the PACS Dashboard.
2. Additional scattered areas of peribronchovascular ground-glass
nodularity are nonspecific. Continued attention on followup exams is
warranted.
3. Irregular liver contour is consistent with cirrhosis.
4. Right adrenal adenoma.

## 2015-11-09 ENCOUNTER — Ambulatory Visit
Admission: RE | Admit: 2015-11-09 | Discharge: 2015-11-09 | Disposition: A | Discharge status: Home / Self Care | Payer: Medicare Other | Source: Ambulatory Visit | Attending: Family Medicine | Admitting: Family Medicine

## 2015-11-09 ENCOUNTER — Inpatient Hospital Stay: Payer: Medicare Other | Attending: Hematology and Oncology

## 2015-11-09 DIAGNOSIS — Z85118 Personal history of other malignant neoplasm of bronchus and lung: Secondary | ICD-10-CM | POA: Insufficient documentation

## 2015-11-09 DIAGNOSIS — C3412 Malignant neoplasm of upper lobe, left bronchus or lung: Secondary | ICD-10-CM

## 2015-11-09 DIAGNOSIS — Z87891 Personal history of nicotine dependence: Secondary | ICD-10-CM | POA: Insufficient documentation

## 2015-11-09 DIAGNOSIS — R682 Dry mouth, unspecified: Secondary | ICD-10-CM | POA: Insufficient documentation

## 2015-11-09 DIAGNOSIS — K7460 Unspecified cirrhosis of liver: Secondary | ICD-10-CM | POA: Insufficient documentation

## 2015-11-09 DIAGNOSIS — R918 Other nonspecific abnormal finding of lung field: Secondary | ICD-10-CM | POA: Diagnosis not present

## 2015-11-09 DIAGNOSIS — Z9221 Personal history of antineoplastic chemotherapy: Secondary | ICD-10-CM | POA: Insufficient documentation

## 2015-11-09 DIAGNOSIS — Z923 Personal history of irradiation: Secondary | ICD-10-CM | POA: Insufficient documentation

## 2015-11-09 DIAGNOSIS — R748 Abnormal levels of other serum enzymes: Secondary | ICD-10-CM | POA: Insufficient documentation

## 2015-11-09 DIAGNOSIS — Z79899 Other long term (current) drug therapy: Secondary | ICD-10-CM | POA: Insufficient documentation

## 2015-11-09 DIAGNOSIS — Z85818 Personal history of malignant neoplasm of other sites of lip, oral cavity, and pharynx: Secondary | ICD-10-CM | POA: Diagnosis not present

## 2015-11-09 LAB — CBC WITH DIFFERENTIAL/PLATELET
BASOS PCT: 1 %
Basophils Absolute: 0.1 10*3/uL (ref 0–0.1)
EOS ABS: 0.2 10*3/uL (ref 0–0.7)
Eosinophils Relative: 3 %
HEMATOCRIT: 44.5 % (ref 40.0–52.0)
HEMOGLOBIN: 15.7 g/dL (ref 13.0–18.0)
Lymphocytes Relative: 10 %
Lymphs Abs: 0.7 10*3/uL — ABNORMAL LOW (ref 1.0–3.6)
MCH: 33.3 pg (ref 26.0–34.0)
MCHC: 35.3 g/dL (ref 32.0–36.0)
MCV: 94.3 fL (ref 80.0–100.0)
MONOS PCT: 7 %
Monocytes Absolute: 0.5 10*3/uL (ref 0.2–1.0)
NEUTROS ABS: 5.4 10*3/uL (ref 1.4–6.5)
NEUTROS PCT: 79 %
Platelets: 297 10*3/uL (ref 150–440)
RBC: 4.72 MIL/uL (ref 4.40–5.90)
RDW: 13.8 % (ref 11.5–14.5)
WBC: 6.8 10*3/uL (ref 3.8–10.6)

## 2015-11-09 LAB — COMPREHENSIVE METABOLIC PANEL
ALBUMIN: 3.9 g/dL (ref 3.5–5.0)
ALK PHOS: 68 U/L (ref 38–126)
ALT: 77 U/L — AB (ref 17–63)
AST: 51 U/L — AB (ref 15–41)
Anion gap: 7 (ref 5–15)
BUN: 14 mg/dL (ref 6–20)
CALCIUM: 8.3 mg/dL — AB (ref 8.9–10.3)
CHLORIDE: 105 mmol/L (ref 101–111)
CO2: 24 mmol/L (ref 22–32)
CREATININE: 1.07 mg/dL (ref 0.61–1.24)
GFR calc Af Amer: >60 mL/min (ref >60)
GFR calc non Af Amer: >60 mL/min (ref >60)
GLUCOSE: 165 mg/dL — AB (ref 65–99)
Potassium: 4 mmol/L (ref 3.5–5.1)
SODIUM: 136 mmol/L (ref 135–145)
Total Bilirubin: 0.4 mg/dL (ref 0.3–1.2)
Total Protein: 6.9 g/dL (ref 6.5–8.1)

## 2015-11-09 MED ORDER — IOPAMIDOL (ISOVUE-300) INJECTION 61%
75.0000 mL | Freq: Once | INTRAVENOUS | Status: AC | PRN
  Administered 2015-11-09: 75 mL via INTRAVENOUS

## 2015-11-16 ENCOUNTER — Inpatient Hospital Stay: Payer: Medicare Other

## 2015-11-16 ENCOUNTER — Inpatient Hospital Stay (HOSPITAL_BASED_OUTPATIENT_CLINIC_OR_DEPARTMENT_OTHER): Payer: Medicare Other | Admitting: Hematology and Oncology

## 2015-11-16 ENCOUNTER — Encounter: Payer: Self-pay | Admitting: Hematology and Oncology

## 2015-11-16 ENCOUNTER — Ambulatory Visit: Payer: Medicare Other | Admitting: Oncology

## 2015-11-16 VITALS — BP 159/107 | HR 85 | Temp 96.8°F | Resp 18 | Wt 205.7 lb

## 2015-11-16 DIAGNOSIS — Z85818 Personal history of malignant neoplasm of other sites of lip, oral cavity, and pharynx: Secondary | ICD-10-CM | POA: Diagnosis not present

## 2015-11-16 DIAGNOSIS — Z923 Personal history of irradiation: Secondary | ICD-10-CM | POA: Diagnosis not present

## 2015-11-16 DIAGNOSIS — R682 Dry mouth, unspecified: Secondary | ICD-10-CM

## 2015-11-16 DIAGNOSIS — K7460 Unspecified cirrhosis of liver: Secondary | ICD-10-CM

## 2015-11-16 DIAGNOSIS — R945 Abnormal results of liver function studies: Secondary | ICD-10-CM

## 2015-11-16 DIAGNOSIS — Z9221 Personal history of antineoplastic chemotherapy: Secondary | ICD-10-CM

## 2015-11-16 DIAGNOSIS — C119 Malignant neoplasm of nasopharynx, unspecified: Secondary | ICD-10-CM

## 2015-11-16 DIAGNOSIS — R7989 Other specified abnormal findings of blood chemistry: Secondary | ICD-10-CM

## 2015-11-16 DIAGNOSIS — R748 Abnormal levels of other serum enzymes: Secondary | ICD-10-CM

## 2015-11-16 DIAGNOSIS — G4700 Insomnia, unspecified: Secondary | ICD-10-CM | POA: Insufficient documentation

## 2015-11-16 DIAGNOSIS — Z79899 Other long term (current) drug therapy: Secondary | ICD-10-CM

## 2015-11-16 DIAGNOSIS — Z85118 Personal history of other malignant neoplasm of bronchus and lung: Secondary | ICD-10-CM

## 2015-11-16 DIAGNOSIS — C3412 Malignant neoplasm of upper lobe, left bronchus or lung: Secondary | ICD-10-CM

## 2015-11-16 DIAGNOSIS — C771 Secondary and unspecified malignant neoplasm of intrathoracic lymph nodes: Secondary | ICD-10-CM

## 2015-11-16 MED ORDER — TEMAZEPAM 30 MG PO CAPS: ORAL_CAPSULE | ORAL | 0 refills | Status: DC

## 2015-11-16 MED ORDER — TRAZODONE HCL 50 MG PO TABS: 50.0000 mg | ORAL_TABLET | Freq: Every evening | ORAL | 3 refills | Status: DC | PRN

## 2015-11-16 NOTE — Progress Notes (Signed)
[facility] day:  11/16/2015  Chief Complaint: Jason GERSHMAN is a 60 y.o. 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  HPI:   The patient states that he initially presented with left sided neck adenopathy in 2011. He was referred to Dr. Tami Ribas.   Needle biopsy of the left neck adenopathy on 02/05/2010 revealed squamous cell carcinoma. Endoscopy revealed a nasopharyngeal mass. He was initially felt to have stage T2N3M0 (stage IVB).  PET scan on 02/21/2010 revealed multiple enlarged hypermetabolic left carotid chain and supraclavicular lymph nodes. There was asymmetric hypermetabolic activity of the pharyngeal space of the left nasopharynx.   There was an indeterminate 5 mm pulmonary nodule in the left upper lobe (below resolution of PET).  He received Taxotere, cisplatin and 5-fluorouracil (TPF) for 3 cycles from 03/05/2010 - 04/18/2010.  The patient tolerated his chemotherapy well. He stated that the disease "melt away". He then received concurrent chemotherapy and radiation.  He received weekly cetuximab from 05/29/2010 - 07/17/2010.  Patient completed on 07/24/2010.  He denied any problems with rash or dysphagia.  He did note a dry mouth.  Follow-up surveillance scans revealed mediastinal nodes. Dr. Mortimer Fries performed an EBUS and biopsy of a right paratracheal node on 06/23/2013.  Pathology revealed poorly differentiated carcinoma (lung primary versus metastasis from nasopharyngeal carcinoma). There was focal CK5/6 staining.  P63 and TTF-1 were negative.  He was presented at tumor board. Decision was made to treat as primary lung cancer. Pathologic stage was T1N2M0 (stage IIIB)  He received 3 weeks of full dose carboplatin and Taxol (07/07/2013 - 08/18/2013) followed by 6 weekly cycles of carboplatin and Taxol (10/04/2013 - 11/11/2013) with radiation.  He  tolerated his chemotherapy well.  He has subsequently been followed in clinic.  Chest CT on 03/24/2015 revealed similar size but reduced marginal definition of the left upper lobe pulmonary nodule. Adjacent bandlike density in the left upper lobe. This change in appearance was likely attributable to interval radiation therapy.  There was stable bilateral perihilar paramediastinal fibrosis related to prior therapy.  He was last seen in the medical oncology clinic on 08/09/2015 by Georgeanne Nim, NP.  He was seen by Dr. Baruch Gouty on 10/20/2015.  Chest CT on 11/09/2015 revealed interval progression of interstitial and patchy alveolar opacity in the left upper lobe, in the region of the patient's previous left lung cancer. This is presumably post radiation related.  There is interval development of interstitial and patchy alveolar opacity in the right parahilar region, mainly involving the superior segment right lower lobe. These changes may be radiation related given the similar appearance to the changes in the left lung. If this area of the right lung was not included in the field of radiation, infectious/inflammatory etiology must be considered.  Symptomatically, he feels great.  He states that he stopped smoking 3 years ago.  His weight varies depending on what he eats. He denies any dry mouth.  He denies any respiratory symptoms.   Past Medical History:  Diagnosis Date  . Cancer ([place]) 2011   squemous stage 3  . Lung cancer, upper lobe ([place]) 04/11/2015  . Squamous cell lung cancer ([place]) 2015   Right Lung CA with chemo + rad tx's.    History reviewed. No pertinent surgical history.  Family History  Problem Relation Age of Onset  . Cancer Mother     Social History:  reports that he has quit smoking. His smoking use included Cigarettes. He has a 40.00 pack-year smoking history. He has never used smokeless tobacco. He reports that he does not drink alcohol or use drugs.  She has been alcohol  free since 04/2009.  He stopped smoking 3 years ago.  He lives in [place].  The patient is alone today.  Allergies: No Known Allergies  Current Medications: Current Outpatient Prescriptions  Medication Sig Dispense Refill  . budesonide-formoterol (SYMBICORT) 160-4.5 MCG/ACT inhaler Inhale 1 puff into the lungs daily. 1 Inhaler 3  . temazepam (RESTORIL) 30 MG capsule TAKE 1 CAPSULE AT BEDTIME AS NEEDED FOR SLEEP 90 capsule 0  . traZODone (DESYREL) 50 MG tablet TAKE 1 TABLET AT BEDTIME AS NEEDED 90 tablet 3   No current facility-administered medications for this visit.     Review of Systems:  GENERAL:  Feels good.  Active.  No fevers, sweats or weight loss.  Weight up 6 pounds since last visit. PERFORMANCE STATUS (ECOG):  0 HEENT:  No visual changes, runny nose, sore throat, mouth sores or tenderness. Lungs: No shortness of breath or cough.  No hemoptysis. Cardiac:  No chest pain, palpitations, orthopnea, or PND. GI:  No nausea, vomiting, diarrhea, constipation, melena or hematochezia. GU:  No urgency, frequency, dysuria, or hematuria. Musculoskeletal:  No back pain.  No joint pain.  No muscle tenderness. Extremities:  No pain or swelling. Skin:  No rashes or skin changes. Neuro:  No headache, numbness or weakness, balance or coordination issues. Endocrine:  No diabetes, thyroid issues, hot flashes or night sweats. Psych:  No mood changes, depression or anxiety. Pain:  No focal pain. Review of systems:  All other systems reviewed and found to be negative.  Physical Exam: Blood pressure (!) 159/107, pulse 85, temperature (!) 96.8 F (36 C), temperature source Tympanic, resp. rate 18, weight 205 lb 11 oz (93.3 kg). GENERAL:  Well developed, well nourished, sitting comfortably in the exam room in no acute distress. MENTAL STATUS:  Alert and oriented to person, place and time. HEAD:  Wearing a brown cap.  [place].  Normocephalic, atraumatic, face symmetric, no Cushingoid  features. EYES:  Blue eyes.  Pupils equal round and reactive to light and accomodation.  No conjunctivitis or scleral icterus. ENT:  Somewhat nasal speech.  Oropharynx clear without lesion.  Tongue normal. Mucous membranes moist.  RESPIRATORY:  Clear to auscultation without rales, wheezes or rhonchi. CARDIOVASCULAR:  Regular rate and rhythm without murmur, rub or gallop. ABDOMEN:  Soft, non-tender, with active bowel sounds, and no hepatosplenomegaly.  No masses. SKIN:  Radiation changes around neck.  No rashes, ulcers or lesions. EXTREMITIES: No edema, no skin discoloration or tenderness.  No palpable cords. LYMPH NODES: No palpable cervical, supraclavicular, axillary or inguinal adenopathy  NEUROLOGICAL: Unremarkable. PSYCH:  Appropriate.   No visits with results within 3 Day(s) from this visit.  Latest known visit with results is:  Appointment on 11/09/2015  Component Date Value Ref Range Status  . WBC 11/09/2015 6.8  3.8 - 10.6 K/uL Final  . RBC 11/09/2015 4.72  4.40 - 5.90 MIL/uL Final  . Hemoglobin 11/09/2015 15.7  13.0 - 18.0 g/dL Final  . HCT 11/09/2015 44.5  40.0 - 52.0 % Final  . MCV 11/09/2015 94.3  80.0 - 100.0 fL Final  . MCH 11/09/2015 33.3  26.0 - 34.0 pg Final  . MCHC 11/09/2015 35.3  32.0 - 36.0 g/dL Final  . RDW 11/09/2015 13.8  11.5 - 14.5 %  Final  . Platelets 11/09/2015 297  150 - 440 K/uL Final  . Neutrophils Relative % 11/09/2015 79  % Final  . Neutro Abs 11/09/2015 5.4  1.4 - 6.5 K/uL Final  . Lymphocytes Relative 11/09/2015 10  % Final  . Lymphs Abs 11/09/2015 0.7* 1.0 - 3.6 K/uL Final  . Monocytes Relative 11/09/2015 7  % Final  . Monocytes Absolute 11/09/2015 0.5  0.2 - 1.0 K/uL Final  . Eosinophils Relative 11/09/2015 3  % Final  . Eosinophils Absolute 11/09/2015 0.2  0 - 0.7 K/uL Final  . Basophils Relative 11/09/2015 1  % Final  . Basophils Absolute 11/09/2015 0.1  0 - 0.1 K/uL Final  . Sodium 11/09/2015 136  135 - 145 mmol/L Final  . Potassium  11/09/2015 4.0  3.5 - 5.1 mmol/L Final  . Chloride 11/09/2015 105  101 - 111 mmol/L Final  . CO2 11/09/2015 24  22 - 32 mmol/L Final  . Glucose, Bld 11/09/2015 165* 65 - 99 mg/dL Final  . BUN 11/09/2015 14  6 - 20 mg/dL Final  . Creatinine, Ser 11/09/2015 1.07  0.61 - 1.24 mg/dL Final  . Calcium 11/09/2015 8.3* 8.9 - 10.3 mg/dL Final  . Total Protein 11/09/2015 6.9  6.5 - 8.1 g/dL Final  . Albumin 11/09/2015 3.9  3.5 - 5.0 g/dL Final  . AST 11/09/2015 51* 15 - 41 U/L Final  . ALT 11/09/2015 77* 17 - 63 U/L Final  . Alkaline Phosphatase 11/09/2015 68  38 - 126 U/L Final  . Total Bilirubin 11/09/2015 0.4  0.3 - 1.2 mg/dL Final  . GFR calc non Af Amer 11/09/2015 >60  >60 mL/min Final  . GFR calc Af Amer 11/09/2015 >60  >60 mL/min Final   Comment: (NOTE) The eGFR has been calculated using the CKD EPI equation. This calculation has not been validated in all clinical situations. eGFR's persistently <60 mL/min signify possible Chronic Kidney Disease.   . Anion gap 11/09/2015 7  5 - 15 Final    Assessment:  Jason Bryan is a 59 y.o. 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  He presented with left neck adenopathy.  Needle biopsy of the left neck adenopathy on 02/05/2010 revealed squamous cell carcinoma. Endoscopy revealed a nasopharyngeal mass.  He had clinical stage T3N2M0 (stage IVB).  PET scan on 02/21/2010 revealed multiple enlarged hypermetabolic left carotid chain and supraclavicular lymph nodes. There was asymmetric hypermetabolic activity of the pharyngeal space of the left nasopharynx.   There was an indeterminate 5 mm pulmonary nodule in the left upper lobe (below resolution of PET).  He received 3 cycles of Taxotere, cisplatin and 5-fluorouracil (TPF) (03/05/2010 - 04/18/2010).  He received weekly cetuximab (05/29/2010 - 07/17/2010) and radiation (completed 07/24/2010).    Chest CT on 06/10/2015 revealed bulky  mediastinal adenopathy.  There was a 9 mm LUL spiculated nodule.   EBUS and biopsy of a right paratracheal node on 06/23/2013 revealed poorly differentiated carcinoma (lung primary versus metastasiic nasopharyngeal carcinoma). There was focal CK5/6 staining.  P63 and TTF-1 were negative.  Decision was made to treat as primary lung cancer. Pathologic stage was T1N2M0 (stage IIIB)  He received 3 weeks of full dose carboplatin and Taxol (07/07/2013 - 08/18/2013) followed by 6 weekly cycles of carboplatin and Taxol (10/04/2013 - 11/11/2013) with radiation.   Chest CT on 11/09/2015 revealed interval progression of interstitial and patchy alveolar opacity in the left upper lobe, in the  region of the patient's previous left lung cancer. This is presumably post radiation related.  There is interval development of interstitial and patchy alveolar opacity in the right parahilar region, mainly involving the superior segment right lower lobe. These changes may be radiation related given the similar appearance to the changes in the left lung. If this area of the right lung was not included in the field of radiation, infectious/inflammatory etiology must be considered.  Symptomatically, he feels great.  He denies any dry mouth.  He denies any respiratory symptoms.  He has insomnia. He has elevated liver function tests (new). He has mild hypocalcemia.  Plan: 1.  Review entire medical history, diagnosis and management of advanced stage head and neck cancer as well as stage IIIB lung cancer. Discuss review of imaging studies with Dr. Baruch Gouty.  Chest CT likely reveals radiation changes. He is asymptomatic.  Discuss continued close surveillance. 2. Review labs from 11/09/2015.  Discuss elevated liver function test pregnancies unclear etiology) as any alcohol use. He has cirrhosis. Discussed rechecking LFTs. Discuss low calcium. Discuss calcium supplementation. 3.  Refill Restoril and Desyrel (trazodone).  Patient to have  future refills with Dr. Lavera Guise. 4.  Schedule chest CT in 3 months. 5.  RTC in 2 weeks for LFTs 6.  RTC in 3 months for MD assessment, labs (CBC with diff, CMP, CEA) and review chest CT.  Over 50 minutes was spent with the patient, obtaining and reviewing original records from his diagnosis of nasopharyngeal carcinoma and lung cancer.  I personally reviewed his chest CT with Dr. Baruch Gouty.   Lequita Asal, MD  11/16/2015, 10:37 AM

## 2015-11-17 ENCOUNTER — Other Ambulatory Visit: Payer: Self-pay | Admitting: *Deleted

## 2015-11-17 DIAGNOSIS — C3412 Malignant neoplasm of upper lobe, left bronchus or lung: Secondary | ICD-10-CM

## 2015-11-17 DIAGNOSIS — C119 Malignant neoplasm of nasopharynx, unspecified: Secondary | ICD-10-CM

## 2015-11-17 DIAGNOSIS — C771 Secondary and unspecified malignant neoplasm of intrathoracic lymph nodes: Secondary | ICD-10-CM

## 2015-11-30 ENCOUNTER — Inpatient Hospital Stay: Payer: Medicare Other | Attending: Hematology and Oncology

## 2015-11-30 DIAGNOSIS — Z9221 Personal history of antineoplastic chemotherapy: Secondary | ICD-10-CM | POA: Insufficient documentation

## 2015-11-30 DIAGNOSIS — Z85818 Personal history of malignant neoplasm of other sites of lip, oral cavity, and pharynx: Secondary | ICD-10-CM | POA: Diagnosis not present

## 2015-11-30 DIAGNOSIS — Z85118 Personal history of other malignant neoplasm of bronchus and lung: Secondary | ICD-10-CM | POA: Insufficient documentation

## 2015-11-30 DIAGNOSIS — C119 Malignant neoplasm of nasopharynx, unspecified: Secondary | ICD-10-CM

## 2015-11-30 DIAGNOSIS — Z923 Personal history of irradiation: Secondary | ICD-10-CM | POA: Diagnosis not present

## 2015-11-30 DIAGNOSIS — C3412 Malignant neoplasm of upper lobe, left bronchus or lung: Secondary | ICD-10-CM

## 2015-11-30 DIAGNOSIS — C771 Secondary and unspecified malignant neoplasm of intrathoracic lymph nodes: Secondary | ICD-10-CM

## 2015-11-30 LAB — HEPATIC FUNCTION PANEL
ALT: 56 U/L (ref 17–63)
AST: 48 U/L — ABNORMAL HIGH (ref 15–41)
Albumin: 4 g/dL (ref 3.5–5.0)
Alkaline Phosphatase: 60 U/L (ref 38–126)
Bilirubin, Direct: 0.1 mg/dL (ref 0.1–0.5)
Indirect Bilirubin: 0.5 mg/dL (ref 0.3–0.9)
Total Bilirubin: 0.6 mg/dL (ref 0.3–1.2)
Total Protein: 7.1 g/dL (ref 6.5–8.1)

## 2016-01-12 ENCOUNTER — Encounter: Payer: Self-pay | Admitting: *Deleted

## 2016-01-31 ENCOUNTER — Other Ambulatory Visit: Payer: Self-pay | Admitting: *Deleted

## 2016-01-31 DIAGNOSIS — C3490 Malignant neoplasm of unspecified part of unspecified bronchus or lung: Secondary | ICD-10-CM

## 2016-01-31 MED ORDER — BUDESONIDE-FORMOTEROL FUMARATE 160-4.5 MCG/ACT IN AERO: 1.0000 | INHALATION_SPRAY | Freq: Every day | RESPIRATORY_TRACT | 1 refills | Status: DC

## 2016-02-01 ENCOUNTER — Other Ambulatory Visit: Payer: Self-pay | Admitting: *Deleted

## 2016-02-01 MED ORDER — BUDESONIDE-FORMOTEROL FUMARATE 160-4.5 MCG/ACT IN AERO: 1.0000 | INHALATION_SPRAY | Freq: Every day | RESPIRATORY_TRACT | 1 refills | Status: AC

## 2016-02-01 NOTE — Telephone Encounter (Signed)
Patient advised he will need to get his PCP to refill this

## 2016-02-01 NOTE — Addendum Note (Signed)
Addended by: Betti Cruz on: 02/01/2016 03:14 PM   Modules accepted: Orders

## 2016-02-01 NOTE — Telephone Encounter (Signed)
  Patient given refill on 11/16/2015 and told needed to have future refills by PCP.  M

## 2016-02-15 ENCOUNTER — Ambulatory Visit
Admission: RE | Admit: 2016-02-15 | Discharge: 2016-02-15 | Disposition: A | Discharge status: Home / Self Care | Payer: Medicare Other | Source: Ambulatory Visit | Attending: Hematology and Oncology | Admitting: Hematology and Oncology

## 2016-02-15 DIAGNOSIS — K7581 Nonalcoholic steatohepatitis (NASH): Secondary | ICD-10-CM | POA: Diagnosis not present

## 2016-02-15 DIAGNOSIS — C3412 Malignant neoplasm of upper lobe, left bronchus or lung: Secondary | ICD-10-CM | POA: Diagnosis not present

## 2016-02-15 LAB — POCT I-STAT CREATININE: Creatinine, Ser: 1.1 mg/dL (ref 0.61–1.24)

## 2016-02-15 MED ORDER — IOPAMIDOL (ISOVUE-300) INJECTION 61%
75.0000 mL | Freq: Once | INTRAVENOUS | Status: AC | PRN
  Administered 2016-02-15: 75 mL via INTRAVENOUS

## 2016-02-19 ENCOUNTER — Inpatient Hospital Stay (HOSPITAL_BASED_OUTPATIENT_CLINIC_OR_DEPARTMENT_OTHER): Payer: Medicare Other | Admitting: Hematology and Oncology

## 2016-02-19 ENCOUNTER — Inpatient Hospital Stay: Payer: Medicare Other

## 2016-02-19 ENCOUNTER — Ambulatory Visit
Admission: RE | Admit: 2016-02-19 | Discharge: 2016-02-19 | Disposition: A | Discharge status: Home / Self Care | Payer: Medicare Other | Source: Ambulatory Visit | Attending: Hematology and Oncology | Admitting: Hematology and Oncology

## 2016-02-19 ENCOUNTER — Inpatient Hospital Stay: Payer: Medicare Other | Attending: Hematology and Oncology

## 2016-02-19 ENCOUNTER — Encounter: Payer: Self-pay | Admitting: Hematology and Oncology

## 2016-02-19 VITALS — Temp 99.0°F | Ht 65.0 in | Wt 206.1 lb

## 2016-02-19 DIAGNOSIS — Z85118 Personal history of other malignant neoplasm of bronchus and lung: Secondary | ICD-10-CM | POA: Insufficient documentation

## 2016-02-19 DIAGNOSIS — Z923 Personal history of irradiation: Secondary | ICD-10-CM

## 2016-02-19 DIAGNOSIS — R918 Other nonspecific abnormal finding of lung field: Secondary | ICD-10-CM | POA: Insufficient documentation

## 2016-02-19 DIAGNOSIS — Z9221 Personal history of antineoplastic chemotherapy: Secondary | ICD-10-CM | POA: Diagnosis not present

## 2016-02-19 DIAGNOSIS — R748 Abnormal levels of other serum enzymes: Secondary | ICD-10-CM | POA: Insufficient documentation

## 2016-02-19 DIAGNOSIS — Z79899 Other long term (current) drug therapy: Secondary | ICD-10-CM

## 2016-02-19 DIAGNOSIS — I10 Essential (primary) hypertension: Secondary | ICD-10-CM | POA: Diagnosis not present

## 2016-02-19 DIAGNOSIS — R000 Tachycardia, unspecified: Secondary | ICD-10-CM | POA: Diagnosis not present

## 2016-02-19 DIAGNOSIS — Z23 Encounter for immunization: Secondary | ICD-10-CM | POA: Insufficient documentation

## 2016-02-19 DIAGNOSIS — I499 Cardiac arrhythmia, unspecified: Secondary | ICD-10-CM | POA: Insufficient documentation

## 2016-02-19 DIAGNOSIS — C3412 Malignant neoplasm of upper lobe, left bronchus or lung: Secondary | ICD-10-CM

## 2016-02-19 DIAGNOSIS — Z85818 Personal history of malignant neoplasm of other sites of lip, oral cavity, and pharynx: Secondary | ICD-10-CM | POA: Insufficient documentation

## 2016-02-19 DIAGNOSIS — C771 Secondary and unspecified malignant neoplasm of intrathoracic lymph nodes: Secondary | ICD-10-CM

## 2016-02-19 DIAGNOSIS — Z87891 Personal history of nicotine dependence: Secondary | ICD-10-CM

## 2016-02-19 DIAGNOSIS — C119 Malignant neoplasm of nasopharynx, unspecified: Secondary | ICD-10-CM

## 2016-02-19 DIAGNOSIS — I493 Ventricular premature depolarization: Secondary | ICD-10-CM | POA: Diagnosis not present

## 2016-02-19 DIAGNOSIS — I491 Atrial premature depolarization: Secondary | ICD-10-CM | POA: Insufficient documentation

## 2016-02-19 LAB — CBC WITH DIFFERENTIAL/PLATELET
Basophils Absolute: 0 10*3/uL (ref 0–0.1)
Basophils Relative: 1 %
Eosinophils Absolute: 0.1 10*3/uL (ref 0–0.7)
Eosinophils Relative: 2 %
HCT: 46 % (ref 40.0–52.0)
Hemoglobin: 16.1 g/dL (ref 13.0–18.0)
Lymphocytes Relative: 10 %
Lymphs Abs: 0.8 10*3/uL — ABNORMAL LOW (ref 1.0–3.6)
MCH: 32.8 pg (ref 26.0–34.0)
MCHC: 35 g/dL (ref 32.0–36.0)
MCV: 93.7 fL (ref 80.0–100.0)
Monocytes Absolute: 0.5 10*3/uL (ref 0.2–1.0)
Monocytes Relative: 7 %
Neutro Abs: 6.6 10*3/uL — ABNORMAL HIGH (ref 1.4–6.5)
Neutrophils Relative %: 82 %
Platelets: 291 10*3/uL (ref 150–440)
RBC: 4.91 MIL/uL (ref 4.40–5.90)
RDW: 14.6 % — ABNORMAL HIGH (ref 11.5–14.5)
WBC: 8.1 10*3/uL (ref 3.8–10.6)

## 2016-02-19 LAB — COMPREHENSIVE METABOLIC PANEL
ALT: 65 U/L — ABNORMAL HIGH (ref 17–63)
AST: 63 U/L — ABNORMAL HIGH (ref 15–41)
Albumin: 3.9 g/dL (ref 3.5–5.0)
Alkaline Phosphatase: 68 U/L (ref 38–126)
Anion gap: 10 (ref 5–15)
BUN: 18 mg/dL (ref 6–20)
CO2: 24 mmol/L (ref 22–32)
Calcium: 8.8 mg/dL — ABNORMAL LOW (ref 8.9–10.3)
Chloride: 102 mmol/L (ref 101–111)
Creatinine, Ser: 1.18 mg/dL (ref 0.61–1.24)
GFR calc Af Amer: >60 mL/min (ref >60)
GFR calc non Af Amer: >60 mL/min (ref >60)
Glucose, Bld: 177 mg/dL — ABNORMAL HIGH (ref 65–99)
Potassium: 4.3 mmol/L (ref 3.5–5.1)
Sodium: 136 mmol/L (ref 135–145)
Total Bilirubin: 0.6 mg/dL (ref 0.3–1.2)
Total Protein: 6.8 g/dL (ref 6.5–8.1)

## 2016-02-19 MED ORDER — INFLUENZA VAC SPLIT QUAD 0.5 ML IM SUSY
0.5000 mL | PREFILLED_SYRINGE | Freq: Once | INTRAMUSCULAR | Status: AC
  Administered 2016-02-19: 0.5 mL via INTRAMUSCULAR
  Filled 2016-02-19: qty 0.5

## 2016-02-19 NOTE — Progress Notes (Signed)
[facility] day:  02/19/2016  Chief Complaint: Jason Bryan is a 60 y.o. 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  HPI:   The patient was last seen in the medical oncology clinic on 11/16/2015.  At that time, he was seen for initial assessment by me.  Symptomatically, he felt great.  He denied any dry mouth or respiratory symptoms.  He had insomnia. Interval chest CT was reviewed.  He had elevated liver function tests (new) and mild hypocalcemia.  Liver function tests on 11/30/2015 revealed improvement with only an elevated AST of 48 (15-41).  Chest CT on 02/15/2016 revealed no definite evidence of recurrent or metastatic carcinoma within the thorax.  There was stable patchy left upper lobe airspace opacity, likely due to radiation pneumonitis or post treatment scarring.  Recommendation was for continued attention on follow-up CT.  There was decreased patchy airspace disease in the right lower lobe, consistent with resolving inflammatory or infectious process.  There was severe hepatic steatosis. Nonalcoholic steatohepatitis could not be excluded.  Symptomatically, he notes no problems.  He walks every day.   Past Medical History:  Diagnosis Date  . Cancer ([place]) 2011   squemous stage 3  . Lung cancer, upper lobe ([place]) 04/11/2015  . Squamous cell lung cancer ([place]) 2015   Right Lung CA with chemo + rad tx's.    History reviewed. No pertinent surgical history.  Family History  Problem Relation Age of Onset  . Cancer Mother     Social History:  reports that he has quit smoking. His smoking use included Cigarettes. He has a 40.00 pack-year smoking history. He has never used smokeless tobacco. He reports that he drinks about 4.2 oz of alcohol per week . He reports that he does not use drugs.  She has been alcohol free since 04/2009.  He  stopped smoking 3 years ago.  He walks every day.  He lives in [place].  The patient is alone today.  Allergies: No Known Allergies  Current Medications: Current Outpatient Prescriptions  Medication Sig Dispense Refill  . budesonide-formoterol (SYMBICORT) 160-4.5 MCG/ACT inhaler Inhale 1 puff into the lungs daily. 1 Inhaler 1  . traZODone (DESYREL) 50 MG tablet Take 1 tablet (50 mg total) by mouth at bedtime as needed. 90 tablet 3   Current Facility-Administered Medications  Medication Dose Route Frequency Provider Last Rate Last Dose  . Influenza vac split quadrivalent PF (FLUARIX) injection 0.5 mL  0.5 mL Intramuscular Once Lequita Asal, MD        Review of Systems:  GENERAL:  Feels good.  No fevers, sweats or weight loss.  Weight up 1 pound since last visit. PERFORMANCE STATUS (ECOG):  0 HEENT:  No visual changes, runny nose, sore throat, mouth sores or tenderness. Lungs: No shortness of breath or cough.  No hemoptysis. Cardiac:  No chest pain, palpitations, orthopnea, or PND. GI:  No nausea, vomiting, diarrhea, constipation, melena or hematochezia. GU:  No urgency, frequency, dysuria, or hematuria. Musculoskeletal:  No back pain.  No joint pain.  No muscle tenderness. Extremities:  No pain or swelling. Skin:  No rashes or skin changes. Neuro:  No headache, numbness or weakness, balance or coordination issues. Endocrine:  No diabetes, thyroid issues, hot flashes or night sweats. Psych:  No mood changes, depression or anxiety. Pain:  No focal pain. Review of systems:  All other systems reviewed and found to be negative.  Physical Exam: Temperature 99 F (37.2 C), temperature source Tympanic, height '5\' 5"'  (1.651 m), weight 206 lb 2.1 oz (93.5 kg). GENERAL:  Well developed, well nourished, sitting comfortably in the exam room in no acute distress. MENTAL STATUS:  Alert and oriented to person, place and time. HEAD:  Wearing a cap.  Gray hair.  Normocephalic, atraumatic,  face symmetric, no Cushingoid features. EYES:  Blue eyes.  Pupils equal round and reactive to light and accomodation.  No conjunctivitis or scleral icterus. ENT:  Somewhat nasal speech.  Oropharynx clear without lesion.  Tongue normal.  Edentulous.  Mucous membranes moist.  RESPIRATORY:  Clear to auscultation without rales, wheezes or rhonchi. CARDIOVASCULAR:  Tachycardia with an irregular rhythm.  No murmur, rub or gallop. ABDOMEN:  Soft, non-tender, with active bowel sounds, and no hepatosplenomegaly.  No masses. SKIN:  Radiation changes around neck (no change).  No rashes, ulcers or lesions. EXTREMITIES: No edema, no skin discoloration or tenderness.  No palpable cords. LYMPH NODES: No palpable cervical, supraclavicular, axillary or inguinal adenopathy  NEUROLOGICAL: Unremarkable. PSYCH:  Appropriate.   Appointment on 02/19/2016  Component Date Value Ref Range Status  . WBC 02/19/2016 8.1  3.8 - 10.6 K/uL Final  . RBC 02/19/2016 4.91  4.40 - 5.90 MIL/uL Final  . Hemoglobin 02/19/2016 16.1  13.0 - 18.0 g/dL Final  . HCT 02/19/2016 46.0  40.0 - 52.0 % Final  . MCV 02/19/2016 93.7  80.0 - 100.0 fL Final  . MCH 02/19/2016 32.8  26.0 - 34.0 pg Final  . MCHC 02/19/2016 35.0  32.0 - 36.0 g/dL Final  . RDW 02/19/2016 14.6* 11.5 - 14.5 % Final  . Platelets 02/19/2016 291  150 - 440 K/uL Final  . Neutrophils Relative % 02/19/2016 82  % Final  . Neutro Abs 02/19/2016 6.6* 1.4 - 6.5 K/uL Final  . Lymphocytes Relative 02/19/2016 10  % Final  . Lymphs Abs 02/19/2016 0.8* 1.0 - 3.6 K/uL Final  . Monocytes Relative 02/19/2016 7  % Final  . Monocytes Absolute 02/19/2016 0.5  0.2 - 1.0 K/uL Final  . Eosinophils Relative 02/19/2016 2  % Final  . Eosinophils Absolute 02/19/2016 0.1  0 - 0.7 K/uL Final  . Basophils Relative 02/19/2016 1  % Final  . Basophils Absolute 02/19/2016 0.0  0 - 0.1 K/uL Final  . Sodium 02/19/2016 136  135 - 145 mmol/L Final  . Potassium 02/19/2016 4.3  3.5 - 5.1 mmol/L  Final  . Chloride 02/19/2016 102  101 - 111 mmol/L Final  . CO2 02/19/2016 24  22 - 32 mmol/L Final  . Glucose, Bld 02/19/2016 177* 65 - 99 mg/dL Final  . BUN 02/19/2016 18  6 - 20 mg/dL Final  . Creatinine, Ser 02/19/2016 1.18  0.61 - 1.24 mg/dL Final  . Calcium 02/19/2016 8.8* 8.9 - 10.3 mg/dL Final  . Total Protein 02/19/2016 6.8  6.5 - 8.1 g/dL Final  . Albumin 02/19/2016 3.9  3.5 - 5.0 g/dL Final  . AST 02/19/2016 63* 15 - 41 U/L Final  . ALT 02/19/2016 65* 17 - 63 U/L Final  . Alkaline Phosphatase 02/19/2016 68  38 - 126 U/L Final  . Total Bilirubin 02/19/2016 0.6  0.3 - 1.2 mg/dL Final  . GFR calc non Af Amer 02/19/2016 >60  >60 mL/min Final  . GFR calc Af Amer 02/19/2016 >60  >60 mL/min Final   Comment: (NOTE) The eGFR has been calculated using  the CKD EPI equation. This calculation has not been validated in all clinical situations. eGFR's persistently <60 mL/min signify possible Chronic Kidney Disease.   . Anion gap 02/19/2016 10  5 - 15 Final    Assessment:  Jason Bryan is a 60 y.o. 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  He presented with left neck adenopathy.  Needle biopsy of the left neck adenopathy on 02/05/2010 revealed squamous cell carcinoma. Endoscopy revealed a nasopharyngeal mass.  He had clinical stage T3N2M0 (stage IVB).  PET scan on 02/21/2010 revealed multiple enlarged hypermetabolic left carotid chain and supraclavicular lymph nodes. There was asymmetric hypermetabolic activity of the pharyngeal space of the left nasopharynx.   There was an indeterminate 5 mm pulmonary nodule in the left upper lobe (below resolution of PET).  He received 3 cycles of Taxotere, cisplatin and 5-fluorouracil (TPF) (03/05/2010 - 04/18/2010).  He received weekly cetuximab (05/29/2010 - 07/17/2010) and radiation (completed 07/24/2010).    Chest CT on 06/10/2015 revealed bulky mediastinal adenopathy.  There  was a 9 mm LUL spiculated nodule.   EBUS and biopsy of a right paratracheal node on 06/23/2013 revealed poorly differentiated carcinoma (lung primary versus metastasiic nasopharyngeal carcinoma). There was focal CK5/6 staining.  P63 and TTF-1 were negative.  Decision was made to treat as primary lung cancer. Pathologic stage was T1N2M0 (stage IIIB)  He received 3 weeks of full dose carboplatin and Taxol (07/07/2013 - 08/18/2013) followed by 6 weekly cycles of carboplatin and Taxol (10/04/2013 - 11/11/2013) with radiation.   Chest CT on 11/09/2015 revealed interval progression of interstitial and patchy alveolar opacity in the left upper lobe, in the region of the patient's previous left lung cancer, presumably post radiation.  There was interval development of interstitial and patchy alveolar opacity in the right parahilar region, mainly involving the superior segment right lower lobe.  Chest CT on 02/15/2016 revealed no definite evidence of recurrent or metastatic carcinoma within the thorax.  There was stable patchy left upper lobe airspace opacity, likely due to radiation pneumonitis or post treatment scarring.  There was decreased patchy airspace disease in the right lower lobe.  There was severe hepatic steatosis. Nonalcoholic steatohepatitis could not be excluded.  Symptomatically, he denies any complaints. He has midly elevated liver function tests (new). He has an irregular rhythm.  Plan: 1.  Labs today:  CBC with diff, CMP, CEA. 2.  Review interval chest CT.  Discuss liver changes on CT scan (follow-up with PCP). 3.  EKG 4.  Influenza vaccine today. 5.  Head, neck, and chest CT on 08/15/2015 6.  RTC after CT scan for MD assess, labs (CBC with diff, CMP, CEA).  Addendum:  EKG reveals sinus tachycardia with PACs and possible left atrial enlargement.  When compared with EKG of 11/21/2013, PVCs are no longer present.  Nonspecific T wave abnormality is no longer evident in anterolateral  leads.   Lequita Asal, MD  02/19/2016, 11:51 AM

## 2016-02-19 NOTE — Progress Notes (Signed)
Pt is out of temazepam and needs refill. He is going to see dr Lavera Guise 12/6. He has no cough and breathing good. His heart rate was inc. And the sat was 84 on monitor and then increased over 1 min to 117 and did not rtn to normal.

## 2016-02-20 LAB — CEA: CEA: 3.2 ng/mL (ref 0.0–4.7)

## 2016-02-21 DIAGNOSIS — L02811 Cutaneous abscess of head [any part, except face]: Secondary | ICD-10-CM | POA: Diagnosis not present

## 2016-02-21 DIAGNOSIS — R5381 Other malaise: Secondary | ICD-10-CM | POA: Diagnosis not present

## 2016-02-21 DIAGNOSIS — C3490 Malignant neoplasm of unspecified part of unspecified bronchus or lung: Secondary | ICD-10-CM | POA: Diagnosis not present

## 2016-02-21 DIAGNOSIS — R278 Other lack of coordination: Secondary | ICD-10-CM | POA: Diagnosis not present

## 2016-03-12 ENCOUNTER — Ambulatory Visit: Payer: Medicare Other

## 2016-04-19 ENCOUNTER — Ambulatory Visit: Payer: Self-pay | Admitting: Radiation Oncology

## 2016-04-29 ENCOUNTER — Ambulatory Visit
Admission: RE | Admit: 2016-04-29 | Discharge: 2016-04-29 | Disposition: A | Discharge status: Home / Self Care | Payer: Medicare Other | Source: Ambulatory Visit | Attending: Radiation Oncology | Admitting: Radiation Oncology

## 2016-04-29 ENCOUNTER — Encounter: Payer: Self-pay | Admitting: Radiation Oncology

## 2016-04-29 VITALS — BP 156/96 | HR 104 | Temp 97.3°F | Wt 206.2 lb

## 2016-04-29 DIAGNOSIS — Z923 Personal history of irradiation: Secondary | ICD-10-CM | POA: Diagnosis not present

## 2016-04-29 DIAGNOSIS — Z9221 Personal history of antineoplastic chemotherapy: Secondary | ICD-10-CM | POA: Diagnosis not present

## 2016-04-29 DIAGNOSIS — J069 Acute upper respiratory infection, unspecified: Secondary | ICD-10-CM | POA: Insufficient documentation

## 2016-04-29 DIAGNOSIS — Z87891 Personal history of nicotine dependence: Secondary | ICD-10-CM | POA: Diagnosis not present

## 2016-04-29 DIAGNOSIS — C3412 Malignant neoplasm of upper lobe, left bronchus or lung: Secondary | ICD-10-CM | POA: Diagnosis not present

## 2016-04-29 DIAGNOSIS — Z85818 Personal history of malignant neoplasm of other sites of lip, oral cavity, and pharynx: Secondary | ICD-10-CM | POA: Diagnosis not present

## 2016-04-29 NOTE — Progress Notes (Signed)
Radiation Oncology Follow up Note  Name: Jason Bryan   Date:   04/29/2016 MRN:  [id number] DOB: 1955-10-15    This 61 y.o. male presents to the clinic today for 18 month follow-up status post SB RT to a left lung.Marland Kitchen  REFERRING PROVIDER: Cletis Athens, MD  HPI: Patient is a 61 year old male now out a year and a half having completed SB RT to his left lung. 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 He is doing well recently getting over a URI has been on antibiotic therapy as well as prednisone. He specifically denies cough hemoptysis or chest tightness. He had a CT scan back in November which was fine he is scheduled for follow-up scan in May..  COMPLICATIONS OF TREATMENT: none  FOLLOW UP COMPLIANCE: keeps appointments   PHYSICAL EXAM:  BP (!) 156/96   Pulse (!) 104   Temp 97.3 F (36.3 C)   Wt 206 lb 3.9 oz (93.6 kg)   BMI 34.32 kg/m  Patient has some coarse rhonchi throughout both chest. Well-developed well-nourished patient in NAD. HEENT reveals PERLA, EOMI, discs not visualized.  Oral cavity is clear. No oral mucosal lesions are identified. Neck is clear without evidence of cervical or supraclavicular adenopathy. Lungs are clear to A&P. Cardiac examination is essentially unremarkable with regular rate and rhythm without murmur rub or thrill. Abdomen is benign with no organomegaly or masses noted. Motor sensory and DTR levels are equal and symmetric in the upper and lower extremities. Cranial nerves II through XII are grossly intact. Proprioception is intact. No peripheral adenopathy or edema is identified. No motor or sensory levels are noted. Crude visual fields are within normal range.  RADIOLOGY RESULTS: Previous CT scan is reviewed will review his current one to be done in May  PLAN: At the present time patient is doing well from both a head and neck standpoint as well as lung cancer  standpoint. I am please was overall progress. Will review his CT scans when available at May. We'll see him back in 1 year for follow-up. Patient knows to call with any concerns.  I would like to take this opportunity to thank you for allowing me to participate in the care of your patient.Armstead Peaks., MD

## 2016-05-05 ENCOUNTER — Encounter: Payer: Self-pay | Admitting: Hematology and Oncology

## 2016-06-17 DIAGNOSIS — C3490 Malignant neoplasm of unspecified part of unspecified bronchus or lung: Secondary | ICD-10-CM | POA: Diagnosis not present

## 2016-06-17 DIAGNOSIS — R5381 Other malaise: Secondary | ICD-10-CM | POA: Diagnosis not present

## 2016-06-17 DIAGNOSIS — L02811 Cutaneous abscess of head [any part, except face]: Secondary | ICD-10-CM | POA: Diagnosis not present

## 2016-06-17 DIAGNOSIS — J449 Chronic obstructive pulmonary disease, unspecified: Secondary | ICD-10-CM | POA: Diagnosis not present

## 2016-08-07 ENCOUNTER — Other Ambulatory Visit: Payer: Self-pay | Admitting: *Deleted

## 2016-08-07 ENCOUNTER — Other Ambulatory Visit: Payer: Self-pay | Admitting: Hematology and Oncology

## 2016-08-07 ENCOUNTER — Telehealth: Payer: Self-pay | Admitting: *Deleted

## 2016-08-07 DIAGNOSIS — C119 Malignant neoplasm of nasopharynx, unspecified: Secondary | ICD-10-CM

## 2016-08-07 DIAGNOSIS — C3412 Malignant neoplasm of upper lobe, left bronchus or lung: Secondary | ICD-10-CM

## 2016-08-07 DIAGNOSIS — C3400 Malignant neoplasm of unspecified main bronchus: Secondary | ICD-10-CM

## 2016-08-08 ENCOUNTER — Other Ambulatory Visit: Payer: Self-pay | Admitting: Hematology and Oncology

## 2016-08-08 ENCOUNTER — Telehealth: Payer: Self-pay | Admitting: *Deleted

## 2016-08-08 NOTE — Telephone Encounter (Signed)
Erroneous error

## 2016-08-14 ENCOUNTER — Ambulatory Visit: Payer: Medicare Other

## 2016-08-14 ENCOUNTER — Ambulatory Visit
Admission: RE | Admit: 2016-08-14 | Discharge: 2016-08-14 | Disposition: A | Discharge status: Home / Self Care | Payer: Medicare Other | Source: Ambulatory Visit | Attending: Hematology and Oncology | Admitting: Hematology and Oncology

## 2016-08-14 ENCOUNTER — Telehealth: Payer: Self-pay | Admitting: *Deleted

## 2016-08-14 DIAGNOSIS — I700 Atherosclerosis of aorta: Secondary | ICD-10-CM | POA: Diagnosis not present

## 2016-08-14 DIAGNOSIS — I672 Cerebral atherosclerosis: Secondary | ICD-10-CM | POA: Diagnosis not present

## 2016-08-14 DIAGNOSIS — C3412 Malignant neoplasm of upper lobe, left bronchus or lung: Secondary | ICD-10-CM | POA: Diagnosis not present

## 2016-08-14 DIAGNOSIS — K7460 Unspecified cirrhosis of liver: Secondary | ICD-10-CM | POA: Insufficient documentation

## 2016-08-14 DIAGNOSIS — J90 Pleural effusion, not elsewhere classified: Secondary | ICD-10-CM | POA: Diagnosis not present

## 2016-08-14 DIAGNOSIS — C119 Malignant neoplasm of nasopharynx, unspecified: Secondary | ICD-10-CM

## 2016-08-14 DIAGNOSIS — R590 Localized enlarged lymph nodes: Secondary | ICD-10-CM | POA: Diagnosis not present

## 2016-08-14 LAB — POCT I-STAT CREATININE: Creatinine, Ser: 1 mg/dL (ref 0.61–1.24)

## 2016-08-14 MED ORDER — IOPAMIDOL (ISOVUE-300) INJECTION 61%
100.0000 mL | Freq: Once | INTRAVENOUS | Status: AC | PRN
  Administered 2016-08-14: 100 mL via INTRAVENOUS

## 2016-08-14 NOTE — Telephone Encounter (Signed)
Dr. Mike Gip aware thanks

## 2016-08-14 NOTE — Telephone Encounter (Signed)
Called report   IMPRESSION: 1. New borderline mild cardiomegaly. New small dependent bilateral pleural effusions. New mild interlobular septal thickening in both lungs. This spectrum of findings is suggestive of mild congestive heart failure. 2. Solitary newly mildly enlarged right paratracheal lymph node, which is nonspecific, with recurrent carcinoma not excluded. Consider either follow-up chest CT with IV contrast in 3 months, further evaluation with PET-CT or tissue sampling, as clinically warranted. 3. 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 4. Aortic atherosclerosis. 5. Cirrhosis. These results will be called to the ordering clinician or representative by the Radiologist Assistant, and communication documented in the PACS or zVision Dashboard.

## 2016-08-15 ENCOUNTER — Inpatient Hospital Stay (HOSPITAL_BASED_OUTPATIENT_CLINIC_OR_DEPARTMENT_OTHER): Payer: Medicare Other | Admitting: Hematology and Oncology

## 2016-08-15 ENCOUNTER — Inpatient Hospital Stay: Payer: Medicare Other | Attending: Hematology and Oncology

## 2016-08-15 ENCOUNTER — Inpatient Hospital Stay: Payer: Medicare Other

## 2016-08-15 ENCOUNTER — Encounter: Payer: Self-pay | Admitting: Hematology and Oncology

## 2016-08-15 VITALS — BP 124/67 | HR 86 | Temp 98.6°F | Resp 18 | Wt 196.4 lb

## 2016-08-15 DIAGNOSIS — C119 Malignant neoplasm of nasopharynx, unspecified: Secondary | ICD-10-CM

## 2016-08-15 DIAGNOSIS — Z923 Personal history of irradiation: Secondary | ICD-10-CM | POA: Diagnosis not present

## 2016-08-15 DIAGNOSIS — Z85118 Personal history of other malignant neoplasm of bronchus and lung: Secondary | ICD-10-CM

## 2016-08-15 DIAGNOSIS — Z79899 Other long term (current) drug therapy: Secondary | ICD-10-CM

## 2016-08-15 DIAGNOSIS — C3412 Malignant neoplasm of upper lobe, left bronchus or lung: Secondary | ICD-10-CM

## 2016-08-15 DIAGNOSIS — C771 Secondary and unspecified malignant neoplasm of intrathoracic lymph nodes: Secondary | ICD-10-CM

## 2016-08-15 DIAGNOSIS — I700 Atherosclerosis of aorta: Secondary | ICD-10-CM

## 2016-08-15 DIAGNOSIS — R590 Localized enlarged lymph nodes: Secondary | ICD-10-CM

## 2016-08-15 DIAGNOSIS — Z87891 Personal history of nicotine dependence: Secondary | ICD-10-CM | POA: Insufficient documentation

## 2016-08-15 DIAGNOSIS — R918 Other nonspecific abnormal finding of lung field: Secondary | ICD-10-CM

## 2016-08-15 DIAGNOSIS — K7460 Unspecified cirrhosis of liver: Secondary | ICD-10-CM

## 2016-08-15 DIAGNOSIS — R634 Abnormal weight loss: Secondary | ICD-10-CM

## 2016-08-15 DIAGNOSIS — J90 Pleural effusion, not elsewhere classified: Secondary | ICD-10-CM | POA: Diagnosis not present

## 2016-08-15 DIAGNOSIS — R7989 Other specified abnormal findings of blood chemistry: Secondary | ICD-10-CM

## 2016-08-15 DIAGNOSIS — Z85818 Personal history of malignant neoplasm of other sites of lip, oral cavity, and pharynx: Secondary | ICD-10-CM | POA: Diagnosis not present

## 2016-08-15 DIAGNOSIS — Z9221 Personal history of antineoplastic chemotherapy: Secondary | ICD-10-CM | POA: Insufficient documentation

## 2016-08-15 DIAGNOSIS — Z23 Encounter for immunization: Secondary | ICD-10-CM

## 2016-08-15 LAB — CBC WITH DIFFERENTIAL/PLATELET
Basophils Absolute: 0 10*3/uL (ref 0–0.1)
Basophils Relative: 0 %
Eosinophils Absolute: 0 10*3/uL (ref 0–0.7)
Eosinophils Relative: 0 %
HCT: 42.7 % (ref 40.0–52.0)
Hemoglobin: 14.9 g/dL (ref 13.0–18.0)
Lymphocytes Relative: 4 %
Lymphs Abs: 0.4 10*3/uL — ABNORMAL LOW (ref 1.0–3.6)
MCH: 32 pg (ref 26.0–34.0)
MCHC: 34.9 g/dL (ref 32.0–36.0)
MCV: 91.5 fL (ref 80.0–100.0)
Monocytes Absolute: 1 10*3/uL (ref 0.2–1.0)
Monocytes Relative: 9 %
Neutro Abs: 9.3 10*3/uL — ABNORMAL HIGH (ref 1.4–6.5)
Neutrophils Relative %: 87 %
Platelets: 418 10*3/uL (ref 150–440)
RBC: 4.67 MIL/uL (ref 4.40–5.90)
RDW: 14.7 % — ABNORMAL HIGH (ref 11.5–14.5)
WBC: 10.8 10*3/uL — ABNORMAL HIGH (ref 3.8–10.6)

## 2016-08-15 LAB — COMPREHENSIVE METABOLIC PANEL
ALT: 47 U/L (ref 17–63)
AST: 39 U/L (ref 15–41)
Albumin: 3.6 g/dL (ref 3.5–5.0)
Alkaline Phosphatase: 114 U/L (ref 38–126)
Anion gap: 9 (ref 5–15)
BUN: 20 mg/dL (ref 6–20)
CO2: 32 mmol/L (ref 22–32)
Calcium: 9.3 mg/dL (ref 8.9–10.3)
Chloride: 96 mmol/L — ABNORMAL LOW (ref 101–111)
Creatinine, Ser: 1.04 mg/dL (ref 0.61–1.24)
GFR calc Af Amer: >60 mL/min (ref >60)
GFR calc non Af Amer: >60 mL/min (ref >60)
Glucose, Bld: 153 mg/dL — ABNORMAL HIGH (ref 65–99)
Potassium: 4.7 mmol/L (ref 3.5–5.1)
Sodium: 137 mmol/L (ref 135–145)
Total Bilirubin: 1 mg/dL (ref 0.3–1.2)
Total Protein: 7.3 g/dL (ref 6.5–8.1)

## 2016-08-15 NOTE — Progress Notes (Signed)
Patient here today for results. Offers no complaints.

## 2016-08-15 NOTE — Progress Notes (Signed)
[facility] day:  08/15/2016  Chief Complaint: Jason Bryan is a 61 y.o. 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  HPI:   The patient was last seen in the medical oncology clinic on 02/19/2016.  At that time, he denied any complaints. He had midly elevated liver function tests (new). He has an irregular rhythm with EKG revealing  sinus tachycardia with PACs.  Chest CT had revealed no evidence of recurrent disease.  He had severe hepatic steatosis.    Head and neck CT on 08/14/2016 revealed small but increased bilateral level 4 lymph nodes near the thoracic inlet since 2016 were indeterminate for metastatic involvement (7 mm left; 5 mm right).  PET-CT or repeat neck CT in 3-6 months might best evaluate further.  There was no other cervical lymphadenopathy and nasopharynx was within normal limits.  There was mild soft tissue asymmetry at the right base of tongue where suspicious indistinct hyper-enhancement was noted in 2016. Attention was recommended on follow-up.  Chest CT on 08/14/2016 revealed new borderline mild cardiomegaly.  There was new small dependent bilateral pleural effusions. There was new mild interlobular septal thickening in both lungs. This spectrum of findings was suggestive of mild congestive heart failure.  There was a new solitary 1.0 cm enlarged right paratracheal lymph node (previously 7 mm), which was nonspecific, with recurrent carcinoma not excluded.  Consider either follow-up chest CT with IV contrast in 3 months, further evaluation with PET-CT or tissue sampling, as clinically warranted.  There was stable radiation fibrosis in the left upper lobe and parahilar lungs.  There was aortic atherosclerosis.  There was cirrhosis.  Symptomatically, he denies any complaint.  He stopped drinking 4 weeks  ago.  He sees Dr. Lavera Guise for his heart.   Past Medical History:  Diagnosis Date  . Cancer ([facility]) 2011   squemous stage 3  . Lung cancer, upper lobe ([facility]) 04/11/2015  . Squamous cell lung cancer ([facility]) 2015   Right Lung CA with chemo + rad tx's.    History reviewed. No pertinent surgical history.  Family History  Problem Relation Age of Onset  . Cancer Mother     Social History:  reports that he has quit smoking. His smoking use included Cigarettes. He has a 40.00 pack-year smoking history. He has never used smokeless tobacco. He reports that he drinks about 4.2 oz of alcohol per week . He reports that he does not use drugs.  She has been alcohol free since 04/2009.  He stopped smoking 3 years ago.  He walks every day.  He lives in [place].  The patient is alone today.  Allergies: No Known Allergies  Current Medications: Current Outpatient Prescriptions  Medication Sig Dispense Refill  . budesonide-formoterol (SYMBICORT) 160-4.5 MCG/ACT inhaler Inhale 1 puff into the lungs daily. 1 Inhaler 1  . methylPREDNISolone (MEDROL DOSEPAK) 4 MG TBPK tablet     . temazepam (RESTORIL) 30 MG capsule     . traZODone (DESYREL) 50 MG tablet Take 1 tablet (50 mg total) by mouth at bedtime as needed. 90 tablet 3  . levofloxacin (LEVAQUIN) 500 MG tablet      No current facility-administered medications for this visit.     Review of Systems:  GENERAL:  Feels good.  No fevers, sweats or weight loss.  Weight down 10 pound since  last visit. PERFORMANCE STATUS (ECOG):  0 HEENT:  No visual changes, runny nose, sore throat, mouth sores or tenderness. Lungs: No shortness of breath or cough.  No hemoptysis. Cardiac:  No chest pain, palpitations, orthopnea, or PND. GI:  No nausea, vomiting, diarrhea, constipation, melena or hematochezia. GU:  No urgency, frequency, dysuria, or hematuria. Musculoskeletal:  No back pain.  No joint pain.  No muscle tenderness. Extremities:  No pain or swelling. Skin:  No  rashes or skin changes. Neuro:  No headache, numbness or weakness, balance or coordination issues. Endocrine:  No diabetes, thyroid issues, hot flashes or night sweats. Psych:  No mood changes, depression or anxiety. Pain:  No focal pain. Review of systems:  All other systems reviewed and found to be negative.  Physical Exam: Blood pressure 124/67, pulse 86, temperature 98.6 F (37 C), temperature source Tympanic, resp. rate 18, weight 196 lb 6 oz (89.1 kg). GENERAL:  Well developed, well nourished, gentleman sitting comfortably in the exam room in no acute distress. MENTAL STATUS:  Alert and oriented to person, place and time. HEAD:  Wearing a cap.  Gray hair with goatee.  Normocephalic, atraumatic, face symmetric, no Cushingoid features. EYES:  Blue eyes.  Pupils equal round and reactive to light and accomodation.  No conjunctivitis or scleral icterus. ENT:  Somewhat nasal speech.  Oropharynx clear without lesion.  Tongue normal.  Edentulous.  Mucous membranes moist.  RESPIRATORY:  Clear to auscultation without rales, wheezes or rhonchi. CARDIOVASCULAR:  Tachycardia with an irregular rhythm.  No murmur, rub or gallop. ABDOMEN:  Soft, non-tender, with active bowel sounds, and no hepatosplenomegaly.  No masses. SKIN:  Radiation changes around neck (stable).  No rashes, ulcers or lesions. EXTREMITIES: No edema, no skin discoloration or tenderness.  No palpable cords. LYMPH NODES: No palpable cervical, supraclavicular, axillary or inguinal adenopathy  NEUROLOGICAL: Unremarkable. PSYCH:  Appropriate.  Imaging studies: 02/21/2010:  PET scan revealed multiple enlarged hypermetabolic left carotid chain and supraclavicular lymph nodes. There was asymmetric hypermetabolic activity of the pharyngeal space of the left nasopharynx.   There was an indeterminate 5 mm pulmonary nodule in the left upper lobe (below resolution of PET). 06/10/2015:  Chest CT revealed bulky mediastinal adenopathy.  There was  a 9 mm LUL spiculated nodule.    11/09/2015:  Chest CT revealed interval progression of interstitial and patchy alveolar opacity in the left upper lobe, in the region of the patient's previous left lung cancer, presumably post radiation.  There was interval development of interstitial and patchy alveolar opacity in the right parahilar region, mainly involving the superior segment right lower lobe. 02/15/2016:  Chest CT revealed no definite evidence of recurrent or metastatic carcinoma within the thorax.  There was stable patchy left upper lobe airspace opacity, likely due to radiation pneumonitis or post treatment scarring.  There was decreased patchy airspace disease in the right lower lobe.  There was severe hepatic steatosis. Nonalcoholic steatohepatitis could not be excluded. 08/14/2016:  Head and neck CT revealed small but increased bilateral level 4 lymph nodes near the thoracic inlet since 2016 were indeterminate for metastatic involvement (7 mm left; 5 mm right).  PET-CT or repeat neck CT in 3-6 months might best evaluate further.  There was no other cervical lymphadenopathy and nasopharynx was within normal limits.  There was mild soft tissue asymmetry at the right base of tongue where suspicious indistinct hyper-enhancement was noted in 2016. Attention was recommended on follow-up. 08/14/2016:  Chest CT revealed new borderline mild cardiomegaly.  There was new small dependent bilateral pleural effusions. There was new mild interlobular septal thickening in both lungs. This spectrum of findings was suggestive of mild congestive heart failure.  There was a new solitary 1.0 cm enlarged right paratracheal lymph node (previously 7 mm), which was nonspecific, with recurrent carcinoma not excluded.  Consider either follow-up chest CT with IV contrast in 3 months, further evaluation with PET-CT or tissue sampling, as clinically warranted.  There was stable radiation fibrosis in the left upper lobe and  parahilar lungs.  There was aortic atherosclerosis.  There was cirrhosis.   Appointment on 08/15/2016  Component Date Value Ref Range Status  . WBC 08/15/2016 10.8* 3.8 - 10.6 K/uL Final  . RBC 08/15/2016 4.67  4.40 - 5.90 MIL/uL Final  . Hemoglobin 08/15/2016 14.9  13.0 - 18.0 g/dL Final  . HCT 08/15/2016 42.7  40.0 - 52.0 % Final  . MCV 08/15/2016 91.5  80.0 - 100.0 fL Final  . MCH 08/15/2016 32.0  26.0 - 34.0 pg Final  . MCHC 08/15/2016 34.9  32.0 - 36.0 g/dL Final  . RDW 08/15/2016 14.7* 11.5 - 14.5 % Final  . Platelets 08/15/2016 418  150 - 440 K/uL Final  . Neutrophils Relative % 08/15/2016 87  % Final  . Neutro Abs 08/15/2016 9.3* 1.4 - 6.5 K/uL Final  . Lymphocytes Relative 08/15/2016 4  % Final  . Lymphs Abs 08/15/2016 0.4* 1.0 - 3.6 K/uL Final  . Monocytes Relative 08/15/2016 9  % Final  . Monocytes Absolute 08/15/2016 1.0  0.2 - 1.0 K/uL Final  . Eosinophils Relative 08/15/2016 0  % Final  . Eosinophils Absolute 08/15/2016 0.0  0 - 0.7 K/uL Final  . Basophils Relative 08/15/2016 0  % Final  . Basophils Absolute 08/15/2016 0.0  0 - 0.1 K/uL Final  . Sodium 08/15/2016 137  135 - 145 mmol/L Final  . Potassium 08/15/2016 4.7  3.5 - 5.1 mmol/L Final  . Chloride 08/15/2016 96* 101 - 111 mmol/L Final  . CO2 08/15/2016 32  22 - 32 mmol/L Final  . Glucose, Bld 08/15/2016 153* 65 - 99 mg/dL Final  . BUN 08/15/2016 20  6 - 20 mg/dL Final  . Creatinine, Ser 08/15/2016 1.04  0.61 - 1.24 mg/dL Final  . Calcium 08/15/2016 9.3  8.9 - 10.3 mg/dL Final  . Total Protein 08/15/2016 7.3  6.5 - 8.1 g/dL Final  . Albumin 08/15/2016 3.6  3.5 - 5.0 g/dL Final  . AST 08/15/2016 39  15 - 41 U/L Final  . ALT 08/15/2016 47  17 - 63 U/L Final  . Alkaline Phosphatase 08/15/2016 114  38 - 126 U/L Final  . Total Bilirubin 08/15/2016 1.0  0.3 - 1.2 mg/dL Final  . GFR calc non Af Amer 08/15/2016 >60  >60 mL/min Final  . GFR calc Af Amer 08/15/2016 >60  >60 mL/min Final   Comment: (NOTE) The eGFR has  been calculated using the CKD EPI equation. This calculation has not been validated in all clinical situations. eGFR's persistently <60 mL/min signify possible Chronic Kidney Disease.   Georgiann Hahn gap 08/15/2016 9  5 - 15 Final  Hospital Outpatient Visit on 08/14/2016  Component Date Value Ref Range Status  . Creatinine, Ser 08/14/2016 1.00  0.61 - 1.24 mg/dL Final    Assessment:  Jason Bryan is a 61 y.o. 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  He presented with left neck  adenopathy.  Needle biopsy of the left neck adenopathy on 02/05/2010 revealed squamous cell carcinoma. Endoscopy revealed a nasopharyngeal mass.  He had clinical stage T3N2M0 (stage IVB).  PET scan on 02/21/2010 revealed multiple enlarged hypermetabolic left carotid chain and supraclavicular lymph nodes. There was asymmetric hypermetabolic activity of the pharyngeal space of the left nasopharynx.   There was an indeterminate 5 mm pulmonary nodule in the left upper lobe (below resolution of PET).  He received 3 cycles of Taxotere, cisplatin and 5-fluorouracil (TPF) (03/05/2010 - 04/18/2010).  He received weekly cetuximab (05/29/2010 - 07/17/2010) and radiation (completed 07/24/2010).    Chest CT on 06/10/2015 revealed bulky mediastinal adenopathy.  There was a 9 mm LUL spiculated nodule.   EBUS and biopsy of a right paratracheal node on 06/23/2013 revealed poorly differentiated carcinoma (lung primary versus metastasiic nasopharyngeal carcinoma). There was focal CK5/6 staining.  P63 and TTF-1 were negative.  Decision was made to treat as primary lung cancer. Pathologic stage was T1N2M0 (stage IIIB)  He received 3 weeks of full dose carboplatin and Taxol (07/07/2013 - 08/18/2013) followed by 6 weekly cycles of carboplatin and Taxol (10/04/2013 - 11/11/2013) with radiation.   Head and neck CT on 08/14/2016 revealed small but increased bilateral level 4 lymph  nodes near the thoracic inlet since 2016 were indeterminate for metastatic involvement (7 mm left; 5 mm right).  PET-CT or repeat neck CT in 3-6 months might best evaluate further.  There was no other cervical lymphadenopathy and nasopharynx was within normal limits.  There was mild soft tissue asymmetry at the right base of tongue where suspicious indistinct hyper-enhancement was noted in 2016. Attention was recommended on follow-up.  Chest CT on 08/14/2016 revealed new borderline mild cardiomegaly.  There was new small dependent bilateral pleural effusions. There was new mild interlobular septal thickening in both lungs. This spectrum of findings was suggestive of mild congestive heart failure.  There was a new solitary 1.0 cm enlarged right paratracheal lymph node (previously 7 mm), which was nonspecific, with recurrent carcinoma not excluded.  Consider either follow-up chest CT with IV contrast in 3 months, further evaluation with PET-CT or tissue sampling, as clinically warranted.  There was stable radiation fibrosis in the left upper lobe and parahilar lungs.  There was aortic atherosclerosis.  There was cirrhosis.  CEA has been followed:  3.2 on 02/19/2016 and 3.7 on 08/15/2016.  Symptomatically, he denies any complaints. Exam is stable.  Plan: 1.  Labs today:  CBC with diff, CMP, CEA. 2.  Review interval head, neck, and chest CT.  Discuss small lymph nodes in neck and chest.  Discuss follow-up imaging in 3 months. 3.  Follow-up with PCP regarding imaging suggestive of mild CHF (cardiomegaly, pleural effusions, septal thickening) and cirrhosis. 4.  Neck and chest CT on 11/14/2016. 5.  RTC after CT scans for MD assessment, labs (CBC with diff, CMP), and review of scans.   Lequita Asal, MD  08/15/2016, 10:37 AM

## 2016-08-16 LAB — CEA: CEA: 3.7 ng/mL (ref 0.0–4.7)

## 2016-08-29 DIAGNOSIS — R0602 Shortness of breath: Secondary | ICD-10-CM | POA: Diagnosis not present

## 2016-08-29 DIAGNOSIS — I509 Heart failure, unspecified: Secondary | ICD-10-CM | POA: Diagnosis not present

## 2016-09-02 DIAGNOSIS — L02811 Cutaneous abscess of head [any part, except face]: Secondary | ICD-10-CM | POA: Diagnosis not present

## 2016-09-02 DIAGNOSIS — C3490 Malignant neoplasm of unspecified part of unspecified bronchus or lung: Secondary | ICD-10-CM | POA: Diagnosis not present

## 2016-09-02 DIAGNOSIS — R600 Localized edema: Secondary | ICD-10-CM | POA: Diagnosis not present

## 2016-09-02 DIAGNOSIS — L049 Acute lymphadenitis, unspecified: Secondary | ICD-10-CM | POA: Diagnosis not present

## 2016-09-02 DIAGNOSIS — R5381 Other malaise: Secondary | ICD-10-CM | POA: Diagnosis not present

## 2016-09-06 DIAGNOSIS — R600 Localized edema: Secondary | ICD-10-CM | POA: Diagnosis not present

## 2016-09-06 DIAGNOSIS — I509 Heart failure, unspecified: Secondary | ICD-10-CM | POA: Diagnosis not present

## 2016-09-06 DIAGNOSIS — R0602 Shortness of breath: Secondary | ICD-10-CM | POA: Diagnosis not present

## 2016-10-28 ENCOUNTER — Ambulatory Visit
Admission: RE | Admit: 2016-10-28 | Discharge: 2016-10-28 | Disposition: A | Discharge status: Home / Self Care | Payer: Medicare Other | Source: Ambulatory Visit | Attending: Radiation Oncology | Admitting: Radiation Oncology

## 2016-10-28 ENCOUNTER — Encounter: Payer: Self-pay | Admitting: Radiation Oncology

## 2016-10-28 VITALS — BP 112/69 | HR 51 | Temp 98.3°F | Resp 20 | Wt 177.9 lb

## 2016-10-28 DIAGNOSIS — Z923 Personal history of irradiation: Secondary | ICD-10-CM | POA: Insufficient documentation

## 2016-10-28 DIAGNOSIS — Z85818 Personal history of malignant neoplasm of other sites of lip, oral cavity, and pharynx: Secondary | ICD-10-CM | POA: Insufficient documentation

## 2016-10-28 DIAGNOSIS — C3412 Malignant neoplasm of upper lobe, left bronchus or lung: Secondary | ICD-10-CM | POA: Insufficient documentation

## 2016-10-28 DIAGNOSIS — Z87891 Personal history of nicotine dependence: Secondary | ICD-10-CM | POA: Insufficient documentation

## 2016-10-28 NOTE — Progress Notes (Signed)
Radiation Oncology Follow up Note  Name: Jason Bryan   Date:   10/28/2016 MRN:  [id number] DOB: Jun 07, 1955    This 61 y.o. male presents to the clinic today for 2 year follow-up status post SB RT to the left lung for non-small cell lung cancer.  REFERRING PROVIDER: Cletis Athens, MD  HPI: patient is a 61 year old male now out 2 years having completed SB RT to his left lung for non-small cell lung cancer. Patient also has a history of nasopharyngeal carcinoma treated back in 2012 concurrent chemoradiation. He is seen today in routine follow-up and is doing well specifically denies cough hemoptysis or chest tightness. He is scheduled for a CT scan later this month..his last CT scan was in May 2018 stable with no evidence of disease. There are we are following a slightly enlarged right paratracheal lymph node on his next CT scan this month. He specifically denies cough hemoptysis or chest tightness.  COMPLICATIONS OF TREATMENT: none  FOLLOW UP COMPLIANCE: keeps appointments   PHYSICAL EXAM:  BP 112/69   Pulse (!) 51   Temp 98.3 F (36.8 C)   Resp 20   Wt 177 lb 14.6 oz (80.7 kg)   BMI 29.61 kg/m  Well-developed well-nourished patient in NAD. HEENT reveals PERLA, EOMI, discs not visualized.  Oral cavity is clear. No oral mucosal lesions are identified. Neck is clear without evidence of cervical or supraclavicular adenopathy. Lungs are clear to A&P. Cardiac examination is essentially unremarkable with regular rate and rhythm without murmur rub or thrill. Abdomen is benign with no organomegaly or masses noted. Motor sensory and DTR levels are equal and symmetric in the upper and lower extremities. Cranial nerves II through XII are grossly intact. Proprioception is intact. No peripheral adenopathy or edema is identified. No motor or sensory levels are noted. Crude visual fields are within normal range.  RADIOLOGY RESULTS: most recent CT scan is reviewed will review his current scan which will  be performed later this month.  PLAN: present time he appears to be doing well. Will review his CT scan later this month when it becomes available. Otherwise I've asked to see him back in 1 year for follow-up. Patient knows to call sooner with any concerns.  I would like to take this opportunity to thank you for allowing me to participate in the care of your patient.Armstead Peaks., MD

## 2016-11-08 ENCOUNTER — Encounter: Payer: Self-pay | Admitting: Hematology and Oncology

## 2016-11-11 ENCOUNTER — Other Ambulatory Visit: Payer: Self-pay | Admitting: *Deleted

## 2016-11-14 ENCOUNTER — Ambulatory Visit
Admission: RE | Admit: 2016-11-14 | Discharge: 2016-11-14 | Disposition: A | Discharge status: Home / Self Care | Payer: Medicare Other | Source: Ambulatory Visit | Attending: Hematology and Oncology | Admitting: Hematology and Oncology

## 2016-11-14 DIAGNOSIS — J90 Pleural effusion, not elsewhere classified: Secondary | ICD-10-CM | POA: Insufficient documentation

## 2016-11-14 DIAGNOSIS — C119 Malignant neoplasm of nasopharynx, unspecified: Secondary | ICD-10-CM | POA: Diagnosis not present

## 2016-11-14 DIAGNOSIS — C771 Secondary and unspecified malignant neoplasm of intrathoracic lymph nodes: Secondary | ICD-10-CM | POA: Insufficient documentation

## 2016-11-14 DIAGNOSIS — J701 Chronic and other pulmonary manifestations due to radiation: Secondary | ICD-10-CM | POA: Diagnosis not present

## 2016-11-14 DIAGNOSIS — I517 Cardiomegaly: Secondary | ICD-10-CM | POA: Insufficient documentation

## 2016-11-14 DIAGNOSIS — R918 Other nonspecific abnormal finding of lung field: Secondary | ICD-10-CM | POA: Insufficient documentation

## 2016-11-14 DIAGNOSIS — J8410 Pulmonary fibrosis, unspecified: Secondary | ICD-10-CM | POA: Diagnosis not present

## 2016-11-14 DIAGNOSIS — C3412 Malignant neoplasm of upper lobe, left bronchus or lung: Secondary | ICD-10-CM | POA: Insufficient documentation

## 2016-11-14 DIAGNOSIS — I700 Atherosclerosis of aorta: Secondary | ICD-10-CM | POA: Diagnosis not present

## 2016-11-14 LAB — POCT I-STAT CREATININE: Creatinine, Ser: 0.7 mg/dL (ref 0.61–1.24)

## 2016-11-14 MED ORDER — IOPAMIDOL (ISOVUE-300) INJECTION 61%
75.0000 mL | Freq: Once | INTRAVENOUS | Status: AC | PRN
  Administered 2016-11-14: 75 mL via INTRAVENOUS

## 2016-11-19 ENCOUNTER — Inpatient Hospital Stay: Payer: Medicare Other

## 2016-11-19 ENCOUNTER — Inpatient Hospital Stay: Payer: Medicare Other | Admitting: Hematology and Oncology

## 2016-11-26 ENCOUNTER — Inpatient Hospital Stay (HOSPITAL_BASED_OUTPATIENT_CLINIC_OR_DEPARTMENT_OTHER): Payer: Medicare Other | Admitting: Hematology and Oncology

## 2016-11-26 ENCOUNTER — Other Ambulatory Visit: Payer: Self-pay | Admitting: *Deleted

## 2016-11-26 ENCOUNTER — Inpatient Hospital Stay: Payer: Medicare Other | Attending: Hematology and Oncology

## 2016-11-26 ENCOUNTER — Encounter: Payer: Self-pay | Admitting: Hematology and Oncology

## 2016-11-26 VITALS — BP 116/72 | HR 64 | Temp 98.4°F | Resp 18 | Wt 184.5 lb

## 2016-11-26 DIAGNOSIS — Z85818 Personal history of malignant neoplasm of other sites of lip, oral cavity, and pharynx: Secondary | ICD-10-CM | POA: Insufficient documentation

## 2016-11-26 DIAGNOSIS — Y842 Radiological procedure and radiotherapy as the cause of abnormal reaction of the patient, or of later complication, without mention of misadventure at the time of the procedure: Secondary | ICD-10-CM | POA: Diagnosis not present

## 2016-11-26 DIAGNOSIS — I517 Cardiomegaly: Secondary | ICD-10-CM | POA: Diagnosis not present

## 2016-11-26 DIAGNOSIS — Z923 Personal history of irradiation: Secondary | ICD-10-CM | POA: Diagnosis not present

## 2016-11-26 DIAGNOSIS — R7989 Other specified abnormal findings of blood chemistry: Secondary | ICD-10-CM | POA: Diagnosis not present

## 2016-11-26 DIAGNOSIS — C771 Secondary and unspecified malignant neoplasm of intrathoracic lymph nodes: Secondary | ICD-10-CM

## 2016-11-26 DIAGNOSIS — R634 Abnormal weight loss: Secondary | ICD-10-CM | POA: Insufficient documentation

## 2016-11-26 DIAGNOSIS — Z79899 Other long term (current) drug therapy: Secondary | ICD-10-CM

## 2016-11-26 DIAGNOSIS — Z85118 Personal history of other malignant neoplasm of bronchus and lung: Secondary | ICD-10-CM | POA: Insufficient documentation

## 2016-11-26 DIAGNOSIS — I509 Heart failure, unspecified: Secondary | ICD-10-CM

## 2016-11-26 DIAGNOSIS — J90 Pleural effusion, not elsewhere classified: Secondary | ICD-10-CM | POA: Diagnosis not present

## 2016-11-26 DIAGNOSIS — C3412 Malignant neoplasm of upper lobe, left bronchus or lung: Secondary | ICD-10-CM

## 2016-11-26 DIAGNOSIS — C119 Malignant neoplasm of nasopharynx, unspecified: Secondary | ICD-10-CM

## 2016-11-26 DIAGNOSIS — R918 Other nonspecific abnormal finding of lung field: Secondary | ICD-10-CM | POA: Diagnosis not present

## 2016-11-26 DIAGNOSIS — Z9221 Personal history of antineoplastic chemotherapy: Secondary | ICD-10-CM

## 2016-11-26 DIAGNOSIS — R590 Localized enlarged lymph nodes: Secondary | ICD-10-CM | POA: Insufficient documentation

## 2016-11-26 DIAGNOSIS — Z87891 Personal history of nicotine dependence: Secondary | ICD-10-CM | POA: Insufficient documentation

## 2016-11-26 LAB — COMPREHENSIVE METABOLIC PANEL
ALT: 16 U/L — ABNORMAL LOW (ref 17–63)
AST: 24 U/L (ref 15–41)
Albumin: 3.9 g/dL (ref 3.5–5.0)
Alkaline Phosphatase: 81 U/L (ref 38–126)
Anion gap: 5 (ref 5–15)
BUN: 14 mg/dL (ref 6–20)
CO2: 31 mmol/L (ref 22–32)
Calcium: 9.3 mg/dL (ref 8.9–10.3)
Chloride: 101 mmol/L (ref 101–111)
Creatinine, Ser: 0.83 mg/dL (ref 0.61–1.24)
GFR calc Af Amer: >60 mL/min (ref >60)
GFR calc non Af Amer: >60 mL/min (ref >60)
Glucose, Bld: 119 mg/dL — ABNORMAL HIGH (ref 65–99)
Potassium: 4.6 mmol/L (ref 3.5–5.1)
Sodium: 137 mmol/L (ref 135–145)
Total Bilirubin: 0.6 mg/dL (ref 0.3–1.2)
Total Protein: 6.8 g/dL (ref 6.5–8.1)

## 2016-11-26 LAB — CBC WITH DIFFERENTIAL/PLATELET
Basophils Absolute: 0.1 10*3/uL (ref 0–0.1)
Basophils Relative: 1 %
Eosinophils Absolute: 0.2 10*3/uL (ref 0–0.7)
Eosinophils Relative: 3 %
HCT: 41.3 % (ref 40.0–52.0)
Hemoglobin: 14.5 g/dL (ref 13.0–18.0)
Lymphocytes Relative: 11 %
Lymphs Abs: 0.9 10*3/uL — ABNORMAL LOW (ref 1.0–3.6)
MCH: 30.2 pg (ref 26.0–34.0)
MCHC: 35.2 g/dL (ref 32.0–36.0)
MCV: 86 fL (ref 80.0–100.0)
Monocytes Absolute: 0.6 10*3/uL (ref 0.2–1.0)
Monocytes Relative: 7 %
Neutro Abs: 6.6 10*3/uL — ABNORMAL HIGH (ref 1.4–6.5)
Neutrophils Relative %: 78 %
Platelets: 274 10*3/uL (ref 150–440)
RBC: 4.81 MIL/uL (ref 4.40–5.90)
RDW: 19.5 % — ABNORMAL HIGH (ref 11.5–14.5)
WBC: 8.4 10*3/uL (ref 3.8–10.6)

## 2016-11-26 NOTE — Progress Notes (Signed)
[facility] day:  11/26/2016  Chief Complaint: Jason Bryan is a 61 y.o. 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  HPI:   The patient was last seen in the medical oncology clinic on 08/15/2016.  At that time, he denied any complaint.  Exam was stable.  CBC, CMP, and CEA were normal.  Soft tissue neck CT on 11/14/2016 revealed stable small bilateral level IV / thoracic inlet lymph nodes.  There was stable mild soft tissue asymmetry along the right base of tongue.  There was no new abnormality in the neck.  Chest CT on 11/14/2016 revealed no definite findings of recurrent metastatic disease in the chest. There was no thoracic adenopathy.  The previously described mildly enlarged right paratracheal lymph node was slightly decreased and now top-normal in size.  There was new mild patchy centrilobular ground-glass micronodularity in the upper lobes bilaterally, probably a mild nonspecific infectious or inflammatory bronchiolitis. Recommend attention on follow-up chest CT.  There was stable radiation fibrosis in the bilateral parahilar lungs and left upper lobe.  There was stable borderline mild cardiomegaly.  There was small dependent bilateral pleural effusions, decreased bilaterally.  There was mild interlobular septal thickening in the lungs, mildly decreased. Spectrum of findings suggested interval improvement in mild congestive heart failure.  Symptomatically, he is doing "great".  He denies physical complaints of anything today. Patient denies fevers, nausea, vomiting, and weight loss. Patient has been seen by cardiology; had echocardiogram that showed cardiomegaly. Patient was placed on "some medicine", which has made "everything better".  He stopped drinking alcohol. He has lost 12 pounds since last visit.    Past  Medical History:  Diagnosis Date  . Cancer ([place]) 2011   squemous stage 3  . Lung cancer, upper lobe ([place]) 04/11/2015  . Squamous cell lung cancer ([place]) 2015   Right Lung CA with chemo + rad tx's.    History reviewed. No pertinent surgical history.  Family History  Problem Relation Age of Onset  . Cancer Mother     Social History:  reports that he has quit smoking. His smoking use included Cigarettes. He has a 40.00 pack-year smoking history. He has never used smokeless tobacco. He reports that he drinks about 4.2 oz of alcohol per week . He reports that he does not use drugs.  She has been alcohol free since 04/2009.  He stopped smoking 3 years ago.  He walks every day.  He lives in [place].  The patient is alone today.  Allergies: No Known Allergies  Current Medications: Current Outpatient Prescriptions  Medication Sig Dispense Refill  . budesonide-formoterol (SYMBICORT) 160-4.5 MCG/ACT inhaler Inhale 1 puff into the lungs daily. 1 Inhaler 1  . CARTIA XT 120 MG 24 hr capsule     . furosemide (LASIX) 20 MG tablet     . lisinopril (PRINIVIL,ZESTRIL) 10 MG tablet     . temazepam (RESTORIL) 30 MG capsule     . traZODone (DESYREL) 50 MG tablet Take 1 tablet (50 mg total) by mouth at bedtime as needed. 90 tablet 3   No current facility-administered medications for this visit.     Review of Systems:  GENERAL:  Feels "great".  No fevers, sweats or weight loss.  Weight down 12 pounds since last visit. PERFORMANCE STATUS (ECOG):  0 HEENT:  No visual changes,  runny nose, sore throat, mouth sores or tenderness. Lungs: No shortness of breath or cough.  No hemoptysis. Cardiac:  No chest pain, palpitations, orthopnea, or PND. GI:  No nausea, vomiting, diarrhea, constipation, melena or hematochezia. GU:  No urgency, frequency, dysuria, or hematuria. Musculoskeletal:  No back pain.  No joint pain.  No muscle tenderness. Extremities:  No pain or swelling. Skin:  No rashes or skin  changes. Neuro:  No headache, numbness or weakness, balance or coordination issues. Endocrine:  No diabetes, thyroid issues, hot flashes or night sweats. Psych:  No mood changes, depression or anxiety. Pain:  No focal pain. Review of systems:  All other systems reviewed and found to be negative.  Physical Exam: Blood pressure 116/72, pulse 64, temperature 98.4 F (36.9 C), temperature source Tympanic, resp. rate 18, weight 184 lb 8 oz (83.7 kg). GENERAL:  Well developed, well nourished, gentleman sitting comfortably in the exam room in no acute distress. MENTAL STATUS:  Alert and oriented to person, place and time. HEAD:  Wearing a purple cap.  Gray hair with goatee.  Normocephalic, atraumatic, face symmetric, no Cushingoid features. EYES:  Blue eyes.  Pupils equal round and reactive to light and accomodation.  No conjunctivitis or scleral icterus. ENT:  Somewhat nasal speech.  Oropharynx clear without lesion.  Tongue normal.  Edentulous.  Mucous membranes moist.  RESPIRATORY:  Clear to auscultation without rales, wheezes or rhonchi. CARDIOVASCULAR:  Tachycardia with an irregular rhythm.  No murmur, rub or gallop. ABDOMEN:  Soft, non-tender, with active bowel sounds, and no hepatosplenomegaly.  No masses. SKIN:  Neck radiation fibrosis.  No rashes, ulcers or lesions. EXTREMITIES: No edema, no skin discoloration or tenderness.  No palpable cords. LYMPH NODES: No palpable cervical, supraclavicular, axillary or inguinal adenopathy  NEUROLOGICAL: Unremarkable. PSYCH:  Appropriate.  Imaging studies: 02/21/2010:  PET scan revealed multiple enlarged hypermetabolic left carotid chain and supraclavicular lymph nodes. There was asymmetric hypermetabolic activity of the pharyngeal space of the left nasopharynx.   There was an indeterminate 5 mm pulmonary nodule in the left upper lobe (below resolution of PET). 06/10/2015:  Chest CT revealed bulky mediastinal adenopathy.  There was a 9 mm LUL  spiculated nodule.    11/09/2015:  Chest CT revealed interval progression of interstitial and patchy alveolar opacity in the left upper lobe, in the region of the patient's previous left lung cancer, presumably post radiation.  There was interval development of interstitial and patchy alveolar opacity in the right parahilar region, mainly involving the superior segment right lower lobe. 02/15/2016:  Chest CT revealed no definite evidence of recurrent or metastatic carcinoma within the thorax.  There was stable patchy left upper lobe airspace opacity, likely due to radiation pneumonitis or post treatment scarring.  There was decreased patchy airspace disease in the right lower lobe.  There was severe hepatic steatosis. Nonalcoholic steatohepatitis could not be excluded. 08/14/2016:  Head and neck CT revealed small but increased bilateral level 4 lymph nodes near the thoracic inlet since 2016 were indeterminate for metastatic involvement (7 mm left; 5 mm right).  PET-CT or repeat neck CT in 3-6 months might best evaluate further.  There was no other cervical lymphadenopathy and nasopharynx was within normal limits.  There was mild soft tissue asymmetry at the right base of tongue where suspicious indistinct hyper-enhancement was noted in 2016. Attention was recommended on follow-up. 08/14/2016:  Chest CT revealed new borderline mild cardiomegaly.  There was new small dependent bilateral pleural effusions. There was new mild  interlobular septal thickening in both lungs. This spectrum of findings was suggestive of mild congestive heart failure.  There was a new solitary 1.0 cm enlarged right paratracheal lymph node (previously 7 mm), which was nonspecific, with recurrent carcinoma not excluded.  Consider either follow-up chest CT with IV contrast in 3 months, further evaluation with PET-CT or tissue sampling, as clinically warranted.  There was stable radiation fibrosis in the left upper lobe and parahilar lungs.   There was aortic atherosclerosis.  There was cirrhosis. 11/14/2016:  Soft tissue neck CT revealed stable small bilateral level IV / thoracic inlet lymph nodes.  There was stable mild soft tissue asymmetry along the right base of tongue.  There was no new abnormality in the neck. 11/14/2016:  Chest CT revealed no definite findings of recurrent metastatic disease in the chest. There was no thoracic adenopathy.  The previously described mildly enlarged right paratracheal lymph node was slightly decreased and now top-normal in size.  There was new mild patchy centrilobular ground-glass micronodularity in the upper lobes bilaterally, probably a mild nonspecific infectious or inflammatory bronchiolitis. Recommend attention on follow-up chest CT.  There was stable radiation fibrosis in the bilateral parahilar lungs and left upper lobe.  There was stable borderline mild cardiomegaly.  There was small dependent bilateral pleural effusions, decreased bilaterally.  There was mild interlobular septal thickening in the lungs, mildly decreased. Spectrum of findings suggested interval improvement in mild congestive heart failure.   Appointment on 11/26/2016  Component Date Value Ref Range Status  . Sodium 11/26/2016 137  135 - 145 mmol/L Final  . Potassium 11/26/2016 4.6  3.5 - 5.1 mmol/L Final  . Chloride 11/26/2016 101  101 - 111 mmol/L Final  . CO2 11/26/2016 31  22 - 32 mmol/L Final  . Glucose, Bld 11/26/2016 119* 65 - 99 mg/dL Final  . BUN 11/26/2016 14  6 - 20 mg/dL Final  . Creatinine, Ser 11/26/2016 0.83  0.61 - 1.24 mg/dL Final  . Calcium 11/26/2016 9.3  8.9 - 10.3 mg/dL Final  . Total Protein 11/26/2016 6.8  6.5 - 8.1 g/dL Final  . Albumin 11/26/2016 3.9  3.5 - 5.0 g/dL Final  . AST 11/26/2016 24  15 - 41 U/L Final  . ALT 11/26/2016 16* 17 - 63 U/L Final  . Alkaline Phosphatase 11/26/2016 81  38 - 126 U/L Final  . Total Bilirubin 11/26/2016 0.6  0.3 - 1.2 mg/dL Final  . GFR calc non Af Amer  11/26/2016 >60  >60 mL/min Final  . GFR calc Af Amer 11/26/2016 >60  >60 mL/min Final   Comment: (NOTE) The eGFR has been calculated using the CKD EPI equation. This calculation has not been validated in all clinical situations. eGFR's persistently <60 mL/min signify possible Chronic Kidney Disease.   . Anion gap 11/26/2016 5  5 - 15 Final  . WBC 11/26/2016 8.4  3.8 - 10.6 K/uL Final  . RBC 11/26/2016 4.81  4.40 - 5.90 MIL/uL Final  . Hemoglobin 11/26/2016 14.5  13.0 - 18.0 g/dL Final  . HCT 11/26/2016 41.3  40.0 - 52.0 % Final  . MCV 11/26/2016 86.0  80.0 - 100.0 fL Final  . MCH 11/26/2016 30.2  26.0 - 34.0 pg Final  . MCHC 11/26/2016 35.2  32.0 - 36.0 g/dL Final  . RDW 11/26/2016 19.5* 11.5 - 14.5 % Final  . Platelets 11/26/2016 274  150 - 440 K/uL Final  . Neutrophils Relative % 11/26/2016 78  % Final  . Neutro Abs  11/26/2016 6.6* 1.4 - 6.5 K/uL Final  . Lymphocytes Relative 11/26/2016 11  % Final  . Lymphs Abs 11/26/2016 0.9* 1.0 - 3.6 K/uL Final  . Monocytes Relative 11/26/2016 7  % Final  . Monocytes Absolute 11/26/2016 0.6  0.2 - 1.0 K/uL Final  . Eosinophils Relative 11/26/2016 3  % Final  . Eosinophils Absolute 11/26/2016 0.2  0 - 0.7 K/uL Final  . Basophils Relative 11/26/2016 1  % Final  . Basophils Absolute 11/26/2016 0.1  0 - 0.1 K/uL Final    Assessment:  Jason Bryan is a 61 y.o. 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  He presented with left neck adenopathy.  Needle biopsy of the left neck adenopathy on 02/05/2010 revealed squamous cell carcinoma. Endoscopy revealed a nasopharyngeal mass.  He had clinical stage T3N2M0 (stage IVB).  PET scan on 02/21/2010 revealed multiple enlarged hypermetabolic left carotid chain and supraclavicular lymph nodes. There was asymmetric hypermetabolic activity of the pharyngeal space of the left nasopharynx.   There was an indeterminate 5 mm pulmonary nodule in the  left upper lobe (below resolution of PET).  He received 3 cycles of Taxotere, cisplatin and 5-fluorouracil (TPF) (03/05/2010 - 04/18/2010).  He received weekly cetuximab (05/29/2010 - 07/17/2010) and radiation (completed 07/24/2010).    Chest CT on 06/10/2015 revealed bulky mediastinal adenopathy.  There was a 9 mm LUL spiculated nodule.   EBUS and biopsy of a right paratracheal node on 06/23/2013 revealed poorly differentiated carcinoma (lung primary versus metastasiic nasopharyngeal carcinoma). There was focal CK5/6 staining.  P63 and TTF-1 were negative.  Decision was made to treat as primary lung cancer. Pathologic stage was T1N2M0 (stage IIIB)  He received 3 weeks of full dose carboplatin and Taxol (07/07/2013 - 08/18/2013) followed by 6 weekly cycles of carboplatin and Taxol (10/04/2013 - 11/11/2013) with radiation.   Soft tissue neck CT on 11/14/2016 revealed stable small bilateral level IV / thoracic inlet lymph nodes.  There was stable mild soft tissue asymmetry along the right base of tongue.  There was no new abnormality in the neck.  Chest CT on 11/14/2016 revealed no definite findings of recurrent metastatic disease in the chest. There was no thoracic adenopathy.  The previously described mildly enlarged right paratracheal lymph node was slightly decreased and now top-normal in size.  There was new mild patchy centrilobular ground-glass micronodularity in the upper lobes bilaterally, probably a mild nonspecific infectious or inflammatory bronchiolitis. Recommend attention on follow-up chest CT.    CEA has been followed:  3.2 on 02/19/2016 and 3.7 on 08/15/2016.  Symptomatically, he denies any complaints.  He stopped drinking alcohol.  Exam is stable.  Plan: 1.  Labs today:  CBC with diff, CMP. 2.  Review interval scans.  No evidence of recurrent disease. 3.  Chest CT in 6 months (05/16/2017). 4.  RTC in 6 months for MD assessment, labs (CBC with diff, CMP, CEA), and review of scans.     Lequita Asal, MD  11/26/2016

## 2016-11-26 NOTE — Progress Notes (Signed)
Patient here today for CT results.  Offers no complaints.

## 2017-05-16 ENCOUNTER — Ambulatory Visit: Admission: RE | Admit: 2017-05-16 | Payer: Medicare Other | Source: Ambulatory Visit

## 2017-05-20 ENCOUNTER — Ambulatory Visit: Payer: Medicare Other | Admitting: Hematology and Oncology

## 2017-05-20 ENCOUNTER — Other Ambulatory Visit: Payer: Medicare Other

## 2017-05-26 ENCOUNTER — Ambulatory Visit
Admission: RE | Admit: 2017-05-26 | Discharge: 2017-05-26 | Disposition: A | Discharge status: Home / Self Care | Payer: Medicare Other | Source: Ambulatory Visit | Attending: Urgent Care | Admitting: Urgent Care

## 2017-05-26 DIAGNOSIS — R918 Other nonspecific abnormal finding of lung field: Secondary | ICD-10-CM | POA: Diagnosis not present

## 2017-05-26 DIAGNOSIS — Z923 Personal history of irradiation: Secondary | ICD-10-CM | POA: Insufficient documentation

## 2017-05-26 DIAGNOSIS — C3412 Malignant neoplasm of upper lobe, left bronchus or lung: Secondary | ICD-10-CM | POA: Diagnosis present

## 2017-05-26 DIAGNOSIS — I700 Atherosclerosis of aorta: Secondary | ICD-10-CM | POA: Diagnosis not present

## 2017-05-26 DIAGNOSIS — Z85118 Personal history of other malignant neoplasm of bronchus and lung: Secondary | ICD-10-CM | POA: Insufficient documentation

## 2017-05-26 DIAGNOSIS — C119 Malignant neoplasm of nasopharynx, unspecified: Secondary | ICD-10-CM | POA: Diagnosis not present

## 2017-05-26 LAB — POCT I-STAT CREATININE: CREATININE: 0.9 mg/dL (ref 0.61–1.24)

## 2017-05-26 MED ORDER — IOPAMIDOL (ISOVUE-300) INJECTION 61%
75.0000 mL | Freq: Once | INTRAVENOUS | Status: AC | PRN
  Administered 2017-05-26: 75 mL via INTRAVENOUS

## 2017-06-03 ENCOUNTER — Inpatient Hospital Stay: Payer: Medicare Other | Attending: Hematology and Oncology

## 2017-06-03 ENCOUNTER — Encounter: Payer: Self-pay | Admitting: Hematology and Oncology

## 2017-06-03 ENCOUNTER — Inpatient Hospital Stay (HOSPITAL_BASED_OUTPATIENT_CLINIC_OR_DEPARTMENT_OTHER): Payer: Medicare Other | Admitting: Hematology and Oncology

## 2017-06-03 VITALS — BP 124/80 | HR 92 | Temp 98.2°F | Wt 208.0 lb

## 2017-06-03 DIAGNOSIS — C119 Malignant neoplasm of nasopharynx, unspecified: Secondary | ICD-10-CM

## 2017-06-03 DIAGNOSIS — Z85818 Personal history of malignant neoplasm of other sites of lip, oral cavity, and pharynx: Secondary | ICD-10-CM

## 2017-06-03 DIAGNOSIS — C3412 Malignant neoplasm of upper lobe, left bronchus or lung: Secondary | ICD-10-CM | POA: Insufficient documentation

## 2017-06-03 DIAGNOSIS — R635 Abnormal weight gain: Secondary | ICD-10-CM

## 2017-06-03 DIAGNOSIS — Z79899 Other long term (current) drug therapy: Secondary | ICD-10-CM | POA: Diagnosis not present

## 2017-06-03 DIAGNOSIS — Z85118 Personal history of other malignant neoplasm of bronchus and lung: Secondary | ICD-10-CM

## 2017-06-03 LAB — CBC WITH DIFFERENTIAL/PLATELET
Basophils Absolute: 0 10*3/uL (ref 0–0.1)
Basophils Relative: 1 %
Eosinophils Absolute: 0.2 10*3/uL (ref 0–0.7)
Eosinophils Relative: 2 %
HCT: 43.1 % (ref 40.0–52.0)
Hemoglobin: 15.1 g/dL (ref 13.0–18.0)
Lymphocytes Relative: 11 %
Lymphs Abs: 1 10*3/uL (ref 1.0–3.6)
MCH: 31.6 pg (ref 26.0–34.0)
MCHC: 35 g/dL (ref 32.0–36.0)
MCV: 90.3 fL (ref 80.0–100.0)
Monocytes Absolute: 0.6 10*3/uL (ref 0.2–1.0)
Monocytes Relative: 7 %
Neutro Abs: 6.8 10*3/uL — ABNORMAL HIGH (ref 1.4–6.5)
Neutrophils Relative %: 79 %
Platelets: 274 10*3/uL (ref 150–440)
RBC: 4.77 MIL/uL (ref 4.40–5.90)
RDW: 14.2 % (ref 11.5–14.5)
WBC: 8.6 10*3/uL (ref 3.8–10.6)

## 2017-06-03 LAB — COMPREHENSIVE METABOLIC PANEL
ALT: 16 U/L — ABNORMAL LOW (ref 17–63)
AST: 18 U/L (ref 15–41)
Albumin: 3.9 g/dL (ref 3.5–5.0)
Alkaline Phosphatase: 71 U/L (ref 38–126)
Anion gap: 8 (ref 5–15)
BUN: 17 mg/dL (ref 6–20)
CO2: 26 mmol/L (ref 22–32)
Calcium: 8.9 mg/dL (ref 8.9–10.3)
Chloride: 102 mmol/L (ref 101–111)
Creatinine, Ser: 0.86 mg/dL (ref 0.61–1.24)
GFR calc Af Amer: >60 mL/min (ref >60)
GFR calc non Af Amer: >60 mL/min (ref >60)
Glucose, Bld: 207 mg/dL — ABNORMAL HIGH (ref 65–99)
Potassium: 4.2 mmol/L (ref 3.5–5.1)
Sodium: 136 mmol/L (ref 135–145)
Total Bilirubin: 0.8 mg/dL (ref 0.3–1.2)
Total Protein: 7.2 g/dL (ref 6.5–8.1)

## 2017-06-03 LAB — T4, FREE: Free T4: 0.71 ng/dL (ref 0.61–1.12)

## 2017-06-03 LAB — TSH: TSH: 3.105 u[IU]/mL (ref 0.350–4.500)

## 2017-06-03 NOTE — Progress Notes (Signed)
[facility] day:  06/03/2017   Chief Complaint: Jason Bryan is a 62 y.o. 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  HPI:   The patient was last seen in the medical oncology clinic on 11/26/2016.  At that time, he denied any complaint.  Exam was stable.  CBC and CMP were normal.  Scans revealed no evidence of recurrent disease.  Chest CT with contrast on 05/26/2017 revealed no findings of active malignancy.  There were perihilar densities and bandlike density in the left upper lobe probably from prior radiation therapy. There was mild chronic airway thickening.  Prior pleural effusions had resolved.  There was a stable 4 mm peribronchovascular nodule in the right upper lobe, possibly from airway plugging but merit surveillance.  During the interim, he has done well.  He notes no problems.  He denies any respiratory symptoms.  Weight is up 24 pounds.   Past Medical History:  Diagnosis Date  . Cancer ([place]) 2011   squemous stage 3  . Lung cancer, upper lobe ([place]) 04/11/2015  . Squamous cell lung cancer ([place]) 2015   Right Lung CA with chemo + rad tx's.    History reviewed. No pertinent surgical history.  Family History  Problem Relation Age of Onset  . Cancer Mother     Social History:  reports that he has quit smoking. His smoking use included cigarettes. He has a 40.00 pack-year smoking history. He has never used smokeless tobacco. He reports that he drinks about 4.2 oz of alcohol per week. He reports that he does not use drugs.  She has been alcohol free since 04/2009.  He stopped smoking 3 years ago.  He walks every day.  He lives in [place].  The patient is alone today.  Allergies: No Known Allergies  Current Medications: Current Outpatient Medications  Medication Sig Dispense Refill  . budesonide-formoterol (SYMBICORT) 160-4.5  MCG/ACT inhaler Inhale 1 puff into the lungs daily. 1 Inhaler 1  . CARTIA XT 120 MG 24 hr capsule     . furosemide (LASIX) 20 MG tablet     . lisinopril (PRINIVIL,ZESTRIL) 10 MG tablet     . temazepam (RESTORIL) 30 MG capsule     . traZODone (DESYREL) 50 MG tablet Take 1 tablet (50 mg total) by mouth at bedtime as needed. 90 tablet 3   No current facility-administered medications for this visit.     Review of Systems:  GENERAL:  Feels "good".  No fevers, sweats or weight loss.  Weight up 24 pounds since last visit. PERFORMANCE STATUS (ECOG):  0 HEENT:  No visual changes, runny nose, sore throat, mouth sores or tenderness. Lungs: No shortness of breath or cough.  No hemoptysis. Cardiac:  No chest pain, palpitations, orthopnea, or PND. GI:  No nausea, vomiting, diarrhea, constipation, melena or hematochezia. GU:  No urgency, frequency, dysuria, or hematuria. Musculoskeletal:  No back pain.  No joint pain.  No muscle tenderness. Extremities:  No pain or swelling. Skin:  No rashes or skin changes. Neuro:  No headache, numbness or weakness, balance or coordination issues. Endocrine:  No diabetes, thyroid issues, hot flashes or night sweats. Psych:  No mood changes, depression or anxiety. Pain:  No focal pain. Review of systems:  All other systems reviewed and found to be negative.  Physical Exam: Blood pressure 124/80, pulse 92, temperature 98.2 F (  36.8 C), temperature source Tympanic, weight 208 lb (94.3 kg), SpO2 95 %. GENERAL:  Well developed, well nourished, gentleman sitting comfortably in the exam room in no acute distress. MENTAL STATUS:  Alert and oriented to person, place and time. HEAD:  Lilyan Punt with goatee.  Normocephalic, atraumatic, face symmetric, no Cushingoid features. EYES:  Blue eyes.  Pupils equal round and reactive to light and accomodation.  No conjunctivitis or scleral icterus. ENT:  Somewhat nasal speech.  Oropharynx clear without lesion.  Tongue normal.   Edentulous.  Mucous membranes moist.  NECK:  Radiation changes. No discrete palpable adenopathy. RESPIRATORY:  Clear to auscultation without rales, wheezes or rhonchi. CARDIOVASCULAR:  Tachycardia with an irregular rhythm.  No murmur, rub or gallop. ABDOMEN:  Soft, non-tender, with active bowel sounds, and no hepatosplenomegaly.  No masses. SKIN:  Neck radiation fibrosis.  No rashes, ulcers or lesions. EXTREMITIES: No edema, no skin discoloration or tenderness.  No palpable cords. LYMPH NODES: No palpable cervical, supraclavicular, axillary or inguinal adenopathy  NEUROLOGICAL: Unremarkable. PSYCH:  Appropriate.   Imaging studies: 02/21/2010:  PET scan revealed multiple enlarged hypermetabolic left carotid chain and supraclavicular lymph nodes. There was asymmetric hypermetabolic activity of the pharyngeal space of the left nasopharynx.   There was an indeterminate 5 mm pulmonary nodule in the left upper lobe (below resolution of PET). 06/10/2015:  Chest CT revealed bulky mediastinal adenopathy.  There was a 9 mm LUL spiculated nodule.    11/09/2015:  Chest CT revealed interval progression of interstitial and patchy alveolar opacity in the left upper lobe, in the region of the patient's previous left lung cancer, presumably post radiation.  There was interval development of interstitial and patchy alveolar opacity in the right parahilar region, mainly involving the superior segment right lower lobe. 02/15/2016:  Chest CT revealed no definite evidence of recurrent or metastatic carcinoma within the thorax.  There was stable patchy left upper lobe airspace opacity, likely due to radiation pneumonitis or post treatment scarring.  There was decreased patchy airspace disease in the right lower lobe.  There was severe hepatic steatosis. Nonalcoholic steatohepatitis could not be excluded. 08/14/2016:  Head and neck CT revealed small but increased bilateral level 4 lymph nodes near the thoracic inlet since  2016 were indeterminate for metastatic involvement (7 mm left; 5 mm right).  PET-CT or repeat neck CT in 3-6 months might best evaluate further.  There was no other cervical lymphadenopathy and nasopharynx was within normal limits.  There was mild soft tissue asymmetry at the right base of tongue where suspicious indistinct hyper-enhancement was noted in 2016. Attention was recommended on follow-up. 08/14/2016:  Chest CT revealed new borderline mild cardiomegaly.  There was new small dependent bilateral pleural effusions. There was new mild interlobular septal thickening in both lungs. This spectrum of findings was suggestive of mild congestive heart failure.  There was a new solitary 1.0 cm enlarged right paratracheal lymph node (previously 7 mm), which was nonspecific, with recurrent carcinoma not excluded.  Consider either follow-up chest CT with IV contrast in 3 months, further evaluation with PET-CT or tissue sampling, as clinically warranted.  There was stable radiation fibrosis in the left upper lobe and parahilar lungs.  There was aortic atherosclerosis.  There was cirrhosis. 11/14/2016:  Soft tissue neck CT revealed stable small bilateral level IV / thoracic inlet lymph nodes.  There was stable mild soft tissue asymmetry along the right base of tongue.  There was no new abnormality in the neck. 11/14/2016:  Chest CT revealed no definite findings of recurrent metastatic disease in the chest. There was no thoracic adenopathy.  The previously described mildly enlarged right paratracheal lymph node was slightly decreased and now top-normal in size.  There was new mild patchy centrilobular ground-glass micronodularity in the upper lobes bilaterally, probably a mild nonspecific infectious or inflammatory bronchiolitis. Recommend attention on follow-up chest CT.  There was stable radiation fibrosis in the bilateral parahilar lungs and left upper lobe.  There was stable borderline mild cardiomegaly.  There was  small dependent bilateral pleural effusions, decreased bilaterally.  There was mild interlobular septal thickening in the lungs, mildly decreased. Spectrum of findings suggested interval improvement in mild congestive heart failure. 05/26/2017:  Chest CT revealed no findings of active malignancy.  There were perihilar densities and bandlike density in the left upper lobe probably from prior radiation therapy. There was mild chronic airway thickening.  Prior pleural effusions had resolved.  There was a stable 4 mm peribronchovascular nodule in the right upper lobe, possibly from airway plugging but merit surveillance.   Appointment on 06/03/2017  Component Date Value Ref Range Status  . CEA 06/03/2017 3.1  0.0 - 4.7 ng/mL Final   Comment: (NOTE)                             Nonsmokers          <3.9                             Smokers             <5.6 [employer] Electrochemiluminescence Immunoassay (ECLIA) Values obtained with different assay methods or kits cannot be used interchangeably.  Results cannot be interpreted as absolute evidence of the presence or absence of malignant disease. Performed At: [facility] [place], [place] [id number] Rush Farmer MD RT:[id number] Performed at [facility], [address], [place], [place] [address]   . Sodium 06/03/2017 136  135 - 145 mmol/L Final  . Potassium 06/03/2017 4.2  3.5 - 5.1 mmol/L Final  . Chloride 06/03/2017 102  101 - 111 mmol/L Final  . CO2 06/03/2017 26  22 - 32 mmol/L Final  . Glucose, Bld 06/03/2017 207* 65 - 99 mg/dL Final  . BUN 06/03/2017 17  6 - 20 mg/dL Final  . Creatinine, Ser 06/03/2017 0.86  0.61 - 1.24 mg/dL Final  . Calcium 06/03/2017 8.9  8.9 - 10.3 mg/dL Final  . Total Protein 06/03/2017 7.2  6.5 - 8.1 g/dL Final  . Albumin 06/03/2017 3.9  3.5 - 5.0 g/dL Final  . AST 06/03/2017 18  15 - 41 U/L Final  . ALT 06/03/2017 16* 17 - 63 U/L Final  . Alkaline Phosphatase 06/03/2017 71   38 - 126 U/L Final  . Total Bilirubin 06/03/2017 0.8  0.3 - 1.2 mg/dL Final  . GFR calc non Af Amer 06/03/2017 >60  >60 mL/min Final  . GFR calc Af Amer 06/03/2017 >60  >60 mL/min Final   Comment: (NOTE) The eGFR has been calculated using the CKD EPI equation. This calculation has not been validated in all clinical situations. eGFR's persistently <60 mL/min signify possible Chronic Kidney Disease.   Georgiann Hahn gap 06/03/2017 8  5 - 15 Final   Performed at [facility], [address] [place], [place] [address]  . WBC 06/03/2017 8.6  3.8 -  10.6 K/uL Final  . RBC 06/03/2017 4.77  4.40 - 5.90 MIL/uL Final  . Hemoglobin 06/03/2017 15.1  13.0 - 18.0 g/dL Final  . HCT 06/03/2017 43.1  40.0 - 52.0 % Final  . MCV 06/03/2017 90.3  80.0 - 100.0 fL Final  . MCH 06/03/2017 31.6  26.0 - 34.0 pg Final  . MCHC 06/03/2017 35.0  32.0 - 36.0 g/dL Final  . RDW 06/03/2017 14.2  11.5 - 14.5 % Final  . Platelets 06/03/2017 274  150 - 440 K/uL Final  . Neutrophils Relative % 06/03/2017 79  % Final  . Neutro Abs 06/03/2017 6.8* 1.4 - 6.5 K/uL Final  . Lymphocytes Relative 06/03/2017 11  % Final  . Lymphs Abs 06/03/2017 1.0  1.0 - 3.6 K/uL Final  . Monocytes Relative 06/03/2017 7  % Final  . Monocytes Absolute 06/03/2017 0.6  0.2 - 1.0 K/uL Final  . Eosinophils Relative 06/03/2017 2  % Final  . Eosinophils Absolute 06/03/2017 0.2  0 - 0.7 K/uL Final  . Basophils Relative 06/03/2017 1  % Final  . Basophils Absolute 06/03/2017 0.0  0 - 0.1 K/uL Final   Performed at [facility], [address], [place], [place] [address]  . Free T4 06/03/2017 0.71  0.61 - 1.12 ng/dL Final   Comment: (NOTE) Biotin ingestion may interfere with free T4 tests. If the results are inconsistent with the TSH level, previous test results, or the clinical presentation, then consider biotin interference. If needed, order repeat testing after stopping biotin. Performed at [facility], [address]  [place], [place] [address]   . TSH 06/03/2017 3.105  0.350 - 4.500 uIU/mL Final   Comment: Performed by a 3rd Generation assay with a functional sensitivity of <=0.01 uIU/mL. Performed at [facility], [address] [place], [place] [address]     Assessment:  Jason Bryan is a 62 y.o. 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  He presented with left neck adenopathy.  Needle biopsy of the left neck adenopathy on 02/05/2010 revealed squamous cell carcinoma. Endoscopy revealed a nasopharyngeal mass.  He had clinical stage T3N2M0 (stage IVB).  PET scan on 02/21/2010 revealed multiple enlarged hypermetabolic left carotid chain and supraclavicular lymph nodes. There was asymmetric hypermetabolic activity of the pharyngeal space of the left nasopharynx.   There was an indeterminate 5 mm pulmonary nodule in the left upper lobe (below resolution of PET).  He received 3 cycles of Taxotere, cisplatin and 5-fluorouracil (TPF) (03/05/2010 - 04/18/2010).  He received weekly cetuximab (05/29/2010 - 07/17/2010) and radiation (completed 07/24/2010).    Chest CT on 06/10/2015 revealed bulky mediastinal adenopathy.  There was a 9 mm LUL spiculated nodule.   EBUS and biopsy of a right paratracheal node on 06/23/2013 revealed poorly differentiated carcinoma (lung primary versus metastasiic nasopharyngeal carcinoma). There was focal CK5/6 staining.  P63 and TTF-1 were negative.  Decision was made to treat as primary lung cancer. Pathologic stage was T1N2M0 (stage IIIB)  He received 3 weeks of full dose carboplatin and Taxol (07/07/2013 - 08/18/2013) followed by 6 weekly cycles of carboplatin and Taxol (10/04/2013 - 11/11/2013) with radiation.   Soft tissue neck CT on 11/14/2016 revealed stable small bilateral level IV / thoracic inlet lymph nodes.  There was stable mild soft tissue asymmetry along the right base of tongue.  There was no new  abnormality in the neck.  Chest CT on 05/26/2017 revealed no  findings of active malignancy.  There were perihilar densities and bandlike density in the left upper lobe probably from prior radiation therapy. There was mild chronic airway thickening.  Prior pleural effusions had resolved.  There was a stable 4 mm peribronchovascular nodule in the right upper lobe, possibly from airway plugging but merit surveillance.  CEA has been followed:  3.2 on 02/19/2016, 3.7 on 08/15/2016, and 3.1 on 06/03/2017.  Symptomatically, he denies any complaints.  Weight is up 24 pounds.  Exam is stable.  Plan: 1.  Labs today:  CBC with diff, CMP, CEA, TSH, free T4. 2.  Review interval chest CT.  No evidence of recurrent disease.  Discuss plan for follow-up imaging.  Anticipate annual low dose annual chest CT beginning 5 years post therapy.  3.  Soft tissue neck and chest CT on 11/26/2017. 4.  RTC after CT for MD assessment, labs (CBC with diff, CMP), and review of scans.    Lequita Asal, MD  06/03/2017, 5:35 PM

## 2017-06-03 NOTE — Progress Notes (Signed)
Patient offers no complaints today. 

## 2017-06-04 LAB — CEA: CEA: 3.1 ng/mL (ref 0.0–4.7)

## 2017-10-29 ENCOUNTER — Encounter: Payer: Self-pay | Admitting: Radiation Oncology

## 2017-10-29 ENCOUNTER — Other Ambulatory Visit: Payer: Self-pay

## 2017-10-29 ENCOUNTER — Ambulatory Visit
Admission: RE | Admit: 2017-10-29 | Discharge: 2017-10-29 | Disposition: A | Discharge status: Home / Self Care | Payer: Medicare Other | Source: Ambulatory Visit | Attending: Radiation Oncology | Admitting: Radiation Oncology

## 2017-10-29 VITALS — BP 125/68 | HR 53 | Temp 97.8°F | Resp 20 | Wt 187.8 lb

## 2017-10-29 DIAGNOSIS — Z87891 Personal history of nicotine dependence: Secondary | ICD-10-CM | POA: Diagnosis not present

## 2017-10-29 DIAGNOSIS — Z923 Personal history of irradiation: Secondary | ICD-10-CM | POA: Diagnosis not present

## 2017-10-29 DIAGNOSIS — Z85118 Personal history of other malignant neoplasm of bronchus and lung: Secondary | ICD-10-CM | POA: Diagnosis not present

## 2017-10-29 DIAGNOSIS — C3412 Malignant neoplasm of upper lobe, left bronchus or lung: Secondary | ICD-10-CM

## 2017-10-29 NOTE — Progress Notes (Signed)
Radiation Oncology Follow up Note  Name: Jason Bryan   Date:   10/29/2017 MRN:  [id number] DOB: [birth year]    This 62 y.o. male presents to the clinic today for 3 year follow-up status post SB RT to his left lung for non-small cell lung cancer.  REFERRING PROVIDER: Cletis Athens, MD  HPI: patient is a 62 year old male previous history of nasopharyngeal carcinoma treated back in 2012 concurrent chemoradiation. He is now 3 years out from SB RT to his left lung for non-small cell lung cancer he is doing well. He specifically denies cough hemoptysis or chest tightness.Marland Kitchene had a CT scan of his chest back in March showing no evidence of disease. Does have some prior radiation changes in the left upper lobe  COMPLICATIONS OF TREATMENT: none  FOLLOW UP COMPLIANCE: keeps appointments   PHYSICAL EXAM:  BP 125/68 (BP Location: Left Arm, Patient Position: Sitting)   Pulse (!) 53   Temp 97.8 F (36.6 C) (Tympanic)   Resp 20   Wt 187 lb 13.3 oz (85.2 kg)   BMI 31.26 kg/m  Well-developed well-nourished patient in NAD. HEENT reveals PERLA, EOMI, discs not visualized.  Oral cavity is clear. No oral mucosal lesions are identified. Neck is clear without evidence of cervical or supraclavicular adenopathy. Lungs are clear to A&P. Cardiac examination is essentially unremarkable with regular rate and rhythm without murmur rub or thrill. Abdomen is benign with no organomegaly or masses noted. Motor sensory and DTR levels are equal and symmetric in the upper and lower extremities. Cranial nerves II through XII are grossly intact. Proprioception is intact. No peripheral adenopathy or edema is identified. No motor or sensory levels are noted. Crude visual fields are within normal range.  RADIOLOGY RESULTS: cT scan is reviewed and compatible above-stated findings  PLAN: present time patient continues to do well with no evidence of disease. I'm please was overall progress. I've asked to see him back in 1 year for  follow-up. Will obtain a CT scan prior to that visit if it's not ordered been ordered. Patient is to call with any concerns at any time.  I would like to take this opportunity to thank you for allowing me to participate in the care of your patient.Noreene Filbert, MD

## 2017-11-26 ENCOUNTER — Ambulatory Visit
Admission: RE | Admit: 2017-11-26 | Discharge: 2017-11-26 | Disposition: A | Discharge status: Home / Self Care | Payer: Medicare Other | Source: Ambulatory Visit | Attending: Hematology and Oncology | Admitting: Hematology and Oncology

## 2017-11-26 ENCOUNTER — Ambulatory Visit: Admission: RE | Admit: 2017-11-26 | Payer: Medicare Other | Source: Ambulatory Visit

## 2017-11-26 DIAGNOSIS — I700 Atherosclerosis of aorta: Secondary | ICD-10-CM | POA: Diagnosis not present

## 2017-11-26 DIAGNOSIS — C3412 Malignant neoplasm of upper lobe, left bronchus or lung: Secondary | ICD-10-CM | POA: Diagnosis not present

## 2017-11-26 DIAGNOSIS — C3490 Malignant neoplasm of unspecified part of unspecified bronchus or lung: Secondary | ICD-10-CM | POA: Diagnosis not present

## 2017-11-26 DIAGNOSIS — C119 Malignant neoplasm of nasopharynx, unspecified: Secondary | ICD-10-CM | POA: Insufficient documentation

## 2017-11-26 DIAGNOSIS — R911 Solitary pulmonary nodule: Secondary | ICD-10-CM | POA: Insufficient documentation

## 2017-11-26 DIAGNOSIS — C760 Malignant neoplasm of head, face and neck: Secondary | ICD-10-CM | POA: Diagnosis not present

## 2017-11-26 LAB — POCT I-STAT CREATININE: Creatinine, Ser: 0.9 mg/dL (ref 0.61–1.24)

## 2017-11-26 MED ORDER — IOPAMIDOL (ISOVUE-300) INJECTION 61%
75.0000 mL | Freq: Once | INTRAVENOUS | Status: AC | PRN
  Administered 2017-11-26: 75 mL via INTRAVENOUS

## 2017-12-02 ENCOUNTER — Inpatient Hospital Stay (HOSPITAL_BASED_OUTPATIENT_CLINIC_OR_DEPARTMENT_OTHER): Payer: Medicare Other | Admitting: Hematology and Oncology

## 2017-12-02 ENCOUNTER — Inpatient Hospital Stay: Payer: Medicare Other | Attending: Hematology and Oncology

## 2017-12-02 ENCOUNTER — Encounter: Payer: Self-pay | Admitting: Hematology and Oncology

## 2017-12-02 VITALS — BP 143/87 | HR 57 | Temp 98.6°F | Resp 18 | Wt 184.4 lb

## 2017-12-02 DIAGNOSIS — C3412 Malignant neoplasm of upper lobe, left bronchus or lung: Secondary | ICD-10-CM

## 2017-12-02 DIAGNOSIS — C119 Malignant neoplasm of nasopharynx, unspecified: Secondary | ICD-10-CM

## 2017-12-02 DIAGNOSIS — R9389 Abnormal findings on diagnostic imaging of other specified body structures: Secondary | ICD-10-CM

## 2017-12-02 DIAGNOSIS — Z87891 Personal history of nicotine dependence: Secondary | ICD-10-CM

## 2017-12-02 DIAGNOSIS — Z85818 Personal history of malignant neoplasm of other sites of lip, oral cavity, and pharynx: Secondary | ICD-10-CM

## 2017-12-02 DIAGNOSIS — Z809 Family history of malignant neoplasm, unspecified: Secondary | ICD-10-CM | POA: Insufficient documentation

## 2017-12-02 DIAGNOSIS — R634 Abnormal weight loss: Secondary | ICD-10-CM

## 2017-12-02 LAB — CBC WITH DIFFERENTIAL/PLATELET
Basophils Absolute: 0.1 10*3/uL (ref 0–0.1)
Basophils Relative: 1 %
Eosinophils Absolute: 0.1 10*3/uL (ref 0–0.7)
Eosinophils Relative: 1 %
HCT: 48.1 % (ref 40.0–52.0)
Hemoglobin: 16.5 g/dL (ref 13.0–18.0)
Lymphocytes Relative: 13 %
Lymphs Abs: 1.2 10*3/uL (ref 1.0–3.6)
MCH: 30 pg (ref 26.0–34.0)
MCHC: 34.2 g/dL (ref 32.0–36.0)
MCV: 87.6 fL (ref 80.0–100.0)
Monocytes Absolute: 0.7 10*3/uL (ref 0.2–1.0)
Monocytes Relative: 7 %
Neutro Abs: 7.6 10*3/uL — ABNORMAL HIGH (ref 1.4–6.5)
Neutrophils Relative %: 78 %
Platelets: 339 10*3/uL (ref 150–440)
RBC: 5.49 MIL/uL (ref 4.40–5.90)
RDW: 16.2 % — ABNORMAL HIGH (ref 11.5–14.5)
WBC: 9.8 10*3/uL (ref 3.8–10.6)

## 2017-12-02 LAB — COMPREHENSIVE METABOLIC PANEL
ALT: 14 U/L (ref 0–44)
AST: 14 U/L — ABNORMAL LOW (ref 15–41)
Albumin: 4.2 g/dL (ref 3.5–5.0)
Alkaline Phosphatase: 81 U/L (ref 38–126)
Anion gap: 10 (ref 5–15)
BUN: 15 mg/dL (ref 8–23)
CO2: 29 mmol/L (ref 22–32)
Calcium: 9.3 mg/dL (ref 8.9–10.3)
Chloride: 101 mmol/L (ref 98–111)
Creatinine, Ser: 0.95 mg/dL (ref 0.61–1.24)
GFR calc Af Amer: >60 mL/min (ref >60)
GFR calc non Af Amer: >60 mL/min (ref >60)
Glucose, Bld: 99 mg/dL (ref 70–99)
Potassium: 4.2 mmol/L (ref 3.5–5.1)
Sodium: 140 mmol/L (ref 135–145)
Total Bilirubin: 0.8 mg/dL (ref 0.3–1.2)
Total Protein: 7.2 g/dL (ref 6.5–8.1)

## 2017-12-02 NOTE — Progress Notes (Signed)
Patient offers no complaints today. 

## 2017-12-02 NOTE — Progress Notes (Signed)
[facility] day:  12/02/2017   Chief Complaint: GERMAINE Bryan is a 62 y.o. 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  HPI:   The patient was last seen in the medical oncology clinic on 06/03/2017.  At that time, he denied any complaints.  Weight was up 24 pounds.  Exam was stable.  CEA was normal.  CBC, CMP, TSH, and free T4 were normal.  Chest CT on 11/26/2017 revealed 1.7 x 2.0 cm masslike area of architectural distortion in the region of previously treated tumor in the left upper lobe identified (previously 1.3 x 1.4 cm).  Underlying recurrent tumor cannot be excluded. Consider further evaluation with PET-CT.  The right upper lobe lung nodule measured 8 mm (increased from 4 mm on 05/26/2017) suspicious for small developing neoplasm.  There was persistent but slightly improved increased soft tissue within the left perihilar region with surrounding changes of external beam radiation.  Soft tissue neck CT on 11/26/2017 revealed a stable examination. There was no new or progressive disease. There was chronic scarring at the right tongue base related to previous treatment.  During the interim, patient doing well overall. He denies any any complaints. Patient has no increased shortness of breath or chest pain. He is not experiencing any xerostomia at this point. Patient is no longer following up with ENT. He states, "ENT cut me off". Patient denies that he has experienced any B symptoms. He denies any interval infections.   Patient advises that he maintains an adequate appetite. He is eating well, and has cut out drink "sweet milk and honey buns". Weight today is 184 lb 7 oz (83.7 kg), which compared to his last visit to the clinic, represents a 24 pound decrease.   Patient denies pain in the clinic today.   Past Medical History:  Diagnosis Date  .  Cancer ([place]) 2011   squemous stage 3, head and neck Ca with chemo + rad tx's.   . Lung cancer, upper lobe ([place]) 04/11/2015  . Squamous cell lung cancer ([place]) 2015   Right Lung CA with chemo + rad tx's.    History reviewed. No pertinent surgical history.  Family History  Problem Relation Age of Onset  . Cancer Mother     Social History:  reports that he has quit smoking. His smoking use included cigarettes. He has a 40.00 pack-year smoking history. He has never used smokeless tobacco. He reports that he drinks about 7.0 standard drinks of alcohol per week. He reports that he does not use drugs.  She has been alcohol free since 04/2009.  He stopped smoking 3 years ago.  He walks every day.  He lives in [place].  The patient is alone today.  Allergies: No Known Allergies  Current Medications: Current Outpatient Medications  Medication Sig Dispense Refill  . budesonide-formoterol (SYMBICORT) 160-4.5 MCG/ACT inhaler Inhale 1 puff into the lungs daily. 1 Inhaler 1  . CARTIA XT 120 MG 24 hr capsule     . furosemide (LASIX) 20 MG tablet     . lisinopril (PRINIVIL,ZESTRIL) 10 MG tablet     . temazepam (RESTORIL) 30 MG capsule     . traZODone (DESYREL) 50 MG tablet Take 1 tablet (50 mg total) by mouth at bedtime as needed. 90 tablet 3   No current facility-administered medications for this visit.     Review  of Systems  Constitutional: Positive for weight loss (down 24 pounds - dietary changes). Negative for diaphoresis, fever and malaise/fatigue.       "I am doing good. Im alright. I don't have any problems".   HENT: Negative.   Eyes: Negative.   Respiratory: Negative for cough, hemoptysis, sputum production and shortness of breath.   Cardiovascular: Negative for chest pain, palpitations, orthopnea, leg swelling and PND.       BRADYcardia  Gastrointestinal: Negative for abdominal pain, blood in stool, constipation, diarrhea, melena, nausea and vomiting.  Genitourinary: Negative for  dysuria, frequency, hematuria and urgency.  Musculoskeletal: Negative for back pain, falls, joint pain and myalgias.  Skin: Negative for itching and rash.  Neurological: Negative for dizziness, tremors, weakness and headaches.  Endo/Heme/Allergies: Does not bruise/bleed easily.  Psychiatric/Behavioral: Negative for depression, memory loss and suicidal ideas. The patient is not nervous/anxious and does not have insomnia.   All other systems reviewed and are negative.  Performance status (ECOG): 0 - Asymptomatic  Vital Signs: BP (!) 143/87 (BP Location: Left Arm, Patient Position: Sitting)   Pulse (!) 57   Temp 98.6 F (37 C) (Tympanic)   Resp 18   Wt 184 lb 7 oz (83.7 kg)   BMI 30.69 kg/m   Physical Exam  Constitutional: He is oriented to person, place, and time and well-developed, well-nourished, and in no distress.  HENT:  Head: Normocephalic and atraumatic.  Mouth/Throat: Mucous membranes are normal. He does not have dentures. Abnormal dentition (edentulous).  Gray hair with goatee  Eyes: Pupils are equal, round, and reactive to light. EOM are normal. No scleral icterus.  Blue eyes  Neck: Normal range of motion. Neck supple. No tracheal deviation present. No thyromegaly present.  Left neck fibrosis s/p radiation  Cardiovascular: Regular rhythm and normal heart sounds. Bradycardia present. Exam reveals no gallop and no friction rub.  No murmur heard. Pulmonary/Chest: Effort normal and breath sounds normal. No respiratory distress. He has no wheezes. He has no rales.  Abdominal: Soft. Bowel sounds are normal. He exhibits no distension. There is no tenderness.  Musculoskeletal: Normal range of motion. He exhibits no edema or tenderness.  Lymphadenopathy:    He has no cervical adenopathy.    He has no axillary adenopathy.       Right: No inguinal and no supraclavicular adenopathy present.       Left: No inguinal and no supraclavicular adenopathy present.  Neurological: He is  alert and oriented to person, place, and time.  Skin: Skin is warm, dry and intact. No rash noted. No erythema.  Psychiatric: Mood, affect and judgment normal.  Nursing note and vitals reviewed.   Imaging studies: 02/21/2010:  PET scan revealed multiple enlarged hypermetabolic left carotid chain and supraclavicular lymph nodes. There was asymmetric hypermetabolic activity of the pharyngeal space of the left nasopharynx.   There was an indeterminate 5 mm pulmonary nodule in the left upper lobe (below resolution of PET). 06/08/2013:  Chest CT revealed bulky mediastinal adenopathy.  There was a 9 mm LUL spiculated nodule.    11/09/2015:  Chest CT revealed interval progression of interstitial and patchy alveolar opacity in the left upper lobe, in the region of the patient's previous left lung cancer, presumably post radiation.  There was interval development of interstitial and patchy alveolar opacity in the right parahilar region, mainly involving the superior segment right lower lobe. 02/15/2016:  Chest CT revealed no definite evidence of recurrent or metastatic carcinoma within the thorax.  There was  stable patchy left upper lobe airspace opacity, likely due to radiation pneumonitis or post treatment scarring.  There was decreased patchy airspace disease in the right lower lobe.  There was severe hepatic steatosis. Nonalcoholic steatohepatitis could not be excluded. 08/14/2016:  Head and neck CT revealed small but increased bilateral level 4 lymph nodes near the thoracic inlet since 2016 were indeterminate for metastatic involvement (7 mm left; 5 mm right).  PET-CT or repeat neck CT in 3-6 months might best evaluate further.  There was no other cervical lymphadenopathy and nasopharynx was within normal limits.  There was mild soft tissue asymmetry at the right base of tongue where suspicious indistinct hyper-enhancement was noted in 2016. Attention was recommended on follow-up. 08/14/2016:  Chest CT  revealed new borderline mild cardiomegaly.  There was new small dependent bilateral pleural effusions. There was new mild interlobular septal thickening in both lungs. This spectrum of findings was suggestive of mild congestive heart failure.  There was a new solitary 1.0 cm enlarged right paratracheal lymph node (previously 7 mm), which was nonspecific, with recurrent carcinoma not excluded.  Consider either follow-up chest CT with IV contrast in 3 months, further evaluation with PET-CT or tissue sampling, as clinically warranted.  There was stable radiation fibrosis in the left upper lobe and parahilar lungs.  There was aortic atherosclerosis.  There was cirrhosis. 11/14/2016:  Soft tissue neck CT revealed stable small bilateral level IV / thoracic inlet lymph nodes.  There was stable mild soft tissue asymmetry along the right base of tongue.  There was no new abnormality in the neck. 11/14/2016:  Chest CT revealed no definite findings of recurrent metastatic disease in the chest. There was no thoracic adenopathy.  The previously described mildly enlarged right paratracheal lymph node was slightly decreased and now top-normal in size.  There was new mild patchy centrilobular ground-glass micronodularity in the upper lobes bilaterally, probably a mild nonspecific infectious or inflammatory bronchiolitis. Recommend attention on follow-up chest CT.  There was stable radiation fibrosis in the bilateral parahilar lungs and left upper lobe.  There was stable borderline mild cardiomegaly.  There was small dependent bilateral pleural effusions, decreased bilaterally.  There was mild interlobular septal thickening in the lungs, mildly decreased. Spectrum of findings suggested interval improvement in mild congestive heart failure. 05/26/2017:  Chest CT revealed no findings of active malignancy.  There were perihilar densities and bandlike density in the left upper lobe probably from prior radiation therapy. There was  mild chronic airway thickening.  Prior pleural effusions had resolved.  There was a stable 4 mm peribronchovascular nodule in the right upper lobe, possibly from airway plugging but merit surveillance. 11/26/2017:  Chest CT revealed a 1.7 x 2.0 cm masslike area of architectural distortion in the region of previously treated tumor in the left upper lobe identified (previously 1.3 x 1.4 cm).  The right upper lobe lung nodule measured 8 mm (increased from 4 mm on 05/26/2017) suspicious for small developing neoplasm.  There was persistent but slightly improved increased soft tissue within the left perihilar region with surrounding changes of external beam radiation. 11/26/2017:  Soft tissue neck CT revealed a stable examination. There was no new or progressive disease. There was chronic scarring at the right tongue base related to previous treatment.   Clinical Support on 12/02/2017  Component Date Value Ref Range Status  . Sodium 12/02/2017 140  135 - 145 mmol/L Final  . Potassium 12/02/2017 4.2  3.5 - 5.1 mmol/L Final  . Chloride  12/02/2017 101  98 - 111 mmol/L Final  . CO2 12/02/2017 29  22 - 32 mmol/L Final  . Glucose, Bld 12/02/2017 99  70 - 99 mg/dL Final  . BUN 12/02/2017 15  8 - 23 mg/dL Final  . Creatinine, Ser 12/02/2017 0.95  0.61 - 1.24 mg/dL Final  . Calcium 12/02/2017 9.3  8.9 - 10.3 mg/dL Final  . Total Protein 12/02/2017 7.2  6.5 - 8.1 g/dL Final  . Albumin 12/02/2017 4.2  3.5 - 5.0 g/dL Final  . AST 12/02/2017 14* 15 - 41 U/L Final  . ALT 12/02/2017 14  0 - 44 U/L Final  . Alkaline Phosphatase 12/02/2017 81  38 - 126 U/L Final  . Total Bilirubin 12/02/2017 0.8  0.3 - 1.2 mg/dL Final  . GFR calc non Af Amer 12/02/2017 >60  >60 mL/min Final  . GFR calc Af Amer 12/02/2017 >60  >60 mL/min Final   Comment: (NOTE) The eGFR has been calculated using the CKD EPI equation. This calculation has not been validated in all clinical situations. eGFR's persistently <60 mL/min signify possible  Chronic Kidney Disease.   Georgiann Hahn gap 12/02/2017 10  5 - 15 Final   Performed at [facility], [address] [place], [place] [address]  . WBC 12/02/2017 9.8  3.8 - 10.6 K/uL Final  . RBC 12/02/2017 5.49  4.40 - 5.90 MIL/uL Final  . Hemoglobin 12/02/2017 16.5  13.0 - 18.0 g/dL Final  . HCT 12/02/2017 48.1  40.0 - 52.0 % Final  . MCV 12/02/2017 87.6  80.0 - 100.0 fL Final  . MCH 12/02/2017 30.0  26.0 - 34.0 pg Final  . MCHC 12/02/2017 34.2  32.0 - 36.0 g/dL Final  . RDW 12/02/2017 16.2* 11.5 - 14.5 % Final  . Platelets 12/02/2017 339  150 - 440 K/uL Final  . Neutrophils Relative % 12/02/2017 78  % Final  . Neutro Abs 12/02/2017 7.6* 1.4 - 6.5 K/uL Final  . Lymphocytes Relative 12/02/2017 13  % Final  . Lymphs Abs 12/02/2017 1.2  1.0 - 3.6 K/uL Final  . Monocytes Relative 12/02/2017 7  % Final  . Monocytes Absolute 12/02/2017 0.7  0.2 - 1.0 K/uL Final  . Eosinophils Relative 12/02/2017 1  % Final  . Eosinophils Absolute 12/02/2017 0.1  0 - 0.7 K/uL Final  . Basophils Relative 12/02/2017 1  % Final  . Basophils Absolute 12/02/2017 0.1  0 - 0.1 K/uL Final   Performed at [facility], [address] [place], [place] [address]    Assessment:  Jason Bryan is a 62 y.o. 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  He presented with left neck adenopathy.  Needle biopsy of the left neck adenopathy on 02/05/2010 revealed squamous cell carcinoma. Endoscopy revealed a nasopharyngeal mass.  He had clinical stage T3N2M0 (stage IVB).  PET scan on 02/21/2010 revealed multiple enlarged hypermetabolic left carotid chain and supraclavicular lymph nodes. There was asymmetric hypermetabolic activity of the pharyngeal space of the left nasopharynx.   There was an indeterminate 5 mm pulmonary nodule in the left upper lobe (below resolution of PET).  He received 3 cycles of Taxotere, cisplatin and 5-fluorouracil (TPF)  (03/05/2010 - 04/18/2010).  He received weekly cetuximab (05/29/2010 - 07/17/2010) and radiation (completed 07/24/2010).    Chest CT on 06/08/2013 revealed bulky mediastinal adenopathy.  There was a 9 mm LUL spiculated nodule.   EBUS and biopsy of a  right paratracheal node on 06/23/2013 revealed poorly differentiated carcinoma (lung primary versus metastasiic nasopharyngeal carcinoma). There was focal CK5/6 staining.  P63 and TTF-1 were negative.  Decision was made to treat as primary lung cancer. Pathologic stage was T1N2M0 (stage IIIB)  He received 3 weeks of full dose carboplatin and Taxol (07/07/2013 - 08/18/2013) followed by 6 weekly cycles of carboplatin and Taxol (10/04/2013 - 11/11/2013) with radiation.   Chest CT on 11/26/2017 revealed 1.7 x 2.0 cm masslike area of architectural distortion in the region of previously treated tumor in the left upper lobe identified (previously 1.3 x 1.4 cm).  Underlying recurrent tumor cannot be excluded. Consider further evaluation with PET-CT.  The right upper lobe lung nodule measured 8 mm (increased from 4 mm on 05/26/2017) suspicious for small developing neoplasm.  There was persistent but slightly improved increased soft tissue within the left perihilar region with surrounding changes of external beam radiation.  Soft tissue neck CT on 11/26/2017 revealed a stable examination. There was no new or progressive disease. There was chronic scarring at the right tongue base related to previous treatment.  CEA has been followed:  3.2 on 02/19/2016, 3.7 on 08/15/2016, and 3.1 on 06/03/2017.  Symptomatically, patient is doing well. He denies any acute complaints. Eating well, but cutting down on "sweet milk and honey buns". Weight is down 24 pounds since last visit. Exam is stable.   Plan: 1. Labs today:  CBC with diff, CMP - reviewed as unremarkable.  2. Stage IIIB poorly differentiated lung cancer:  Review interval chest CT.  Images personally reviewed and  shown to the patient.  Agree with radiology interpretation.    Interval chest CT worrisome for progressive disease.   Discuss need for PET scan to exclude underlying malignancy.    If PET scan +, discuss the need to pursue tissue biopsy. 3. Stage IV squamous cell carcinoma of the nasopharynx:  Stable. No symptoms. Has been released by ENT at this point.  4.   Schedule PET scan. 5.   RTC after PET scan for MD assessment and discussion regarding direction of therapy.   Honor Loh, NP  12/02/2017, 4:10 PM   I saw and evaluated the patient, participating in the key portions of the service and reviewing pertinent diagnostic studies and records.  I reviewed the nurse practitioner's note and agree with the findings and the plan.  The assessment and plan were discussed with the patient.  Multiple questions were asked by the patient and answered.   Nolon Stalls, MD 12/02/2017,4:10 PM

## 2017-12-04 ENCOUNTER — Ambulatory Visit
Admission: RE | Admit: 2017-12-04 | Discharge: 2017-12-04 | Disposition: A | Discharge status: Home / Self Care | Payer: Medicare Other | Source: Ambulatory Visit | Attending: Urgent Care | Admitting: Urgent Care

## 2017-12-04 DIAGNOSIS — R9389 Abnormal findings on diagnostic imaging of other specified body structures: Secondary | ICD-10-CM | POA: Diagnosis not present

## 2017-12-04 DIAGNOSIS — C3412 Malignant neoplasm of upper lobe, left bronchus or lung: Secondary | ICD-10-CM | POA: Diagnosis not present

## 2017-12-04 DIAGNOSIS — J984 Other disorders of lung: Secondary | ICD-10-CM | POA: Diagnosis not present

## 2017-12-04 DIAGNOSIS — I700 Atherosclerosis of aorta: Secondary | ICD-10-CM | POA: Insufficient documentation

## 2017-12-04 LAB — GLUCOSE, CAPILLARY: Glucose-Capillary: 102 mg/dL — ABNORMAL HIGH (ref 70–99)

## 2017-12-04 MED ORDER — FLUDEOXYGLUCOSE F - 18 (FDG) INJECTION
9.5000 | Freq: Once | INTRAVENOUS | Status: AC | PRN
  Administered 2017-12-04: 10.388 via INTRAVENOUS

## 2017-12-06 DIAGNOSIS — R634 Abnormal weight loss: Secondary | ICD-10-CM | POA: Insufficient documentation

## 2017-12-07 NOTE — Progress Notes (Signed)
[facility] day:  12/08/2017   Chief Complaint: Jason Bryan is a 61 y.o. 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  HPI: The patient was last seen in the medical oncology clinic on 12/02/2017.  At that time, patient has been doing well. He denies any acute concerns. Xerostomia had resolved. Patient had lost 24 pounds, which was achieved by "cutting out sweet milk and honey buns". Exam was stable. He was schedule for a PET scan to follow up on recent CT concerns.   PET imaging done on 12/04/2017 revealed mass like opacity in the LUL of the lung. Area demonstrated low level hypermetabolism with a SUV of 2.6. There was low level FDG uptake in the hilar regions BILATERALLY. Findings borderline between infectious/inflammatory etiology versus neoplasm.   In the interim, patient has continued to do well. He denies any acute concerns from his assessment last week. Patient denies that he has experienced any B symptoms. He denies any interval infections.   Patient advises that he maintains an adequate appetite. He is eating well. Weight today is 183 lb 4.8 oz (83.1 kg), which compared to his last visit to the clinic, represents a 1 pound decrease.   Patient denies pain in the clinic today.   Past Medical History:  Diagnosis Date  . Cancer ([place]) 2011   squemous stage 3, head and neck Ca with chemo + rad tx's.   . Lung cancer, upper lobe ([place]) 04/11/2015  . Squamous cell lung cancer ([place]) 2015   Right Lung CA with chemo + rad tx's.    History reviewed. No pertinent surgical history.  Family History  Problem Relation Age of Onset  . Cancer Mother     Social History:  reports that he has quit smoking. His smoking use included cigarettes. He has a 40.00 pack-year smoking history. He has never used smokeless tobacco. He  reports that he drinks about 7.0 standard drinks of alcohol per week. He reports that he does not use drugs.  She has been alcohol free since 04/2009.  He stopped smoking 3 years ago.  He walks every day.  He lives in [place].  The patient is alone today.  Allergies: No Known Allergies  Current Medications: Current Outpatient Medications  Medication Sig Dispense Refill  . budesonide-formoterol (SYMBICORT) 160-4.5 MCG/ACT inhaler Inhale 1 puff into the lungs daily. 1 Inhaler 1  . traZODone (DESYREL) 50 MG tablet Take 1 tablet (50 mg total) by mouth at bedtime as needed. 90 tablet 3  . CARTIA XT 120 MG 24 hr capsule     . furosemide (LASIX) 20 MG tablet     . lisinopril (PRINIVIL,ZESTRIL) 10 MG tablet     . temazepam (RESTORIL) 30 MG capsule      No current facility-administered medications for this visit.     Review of Systems  Constitutional: Positive for weight loss (down 1 pound). Negative for diaphoresis, fever and malaise/fatigue.  HENT: Negative.   Eyes: Negative.   Respiratory: Negative for cough, hemoptysis, sputum production and shortness of breath.   Cardiovascular: Negative for chest pain, palpitations, orthopnea, leg swelling and PND.  Gastrointestinal: Negative for abdominal pain, blood in stool, constipation, diarrhea, melena, nausea and vomiting.  Genitourinary: Negative for dysuria, frequency, hematuria and urgency.  Musculoskeletal: Negative for back pain, falls, joint pain and myalgias.  Skin:  Negative for itching and rash.  Neurological: Negative for dizziness, tremors, weakness and headaches.  Endo/Heme/Allergies: Does not bruise/bleed easily.  Psychiatric/Behavioral: Negative for depression, memory loss and suicidal ideas. The patient is not nervous/anxious and does not have insomnia.   All other systems reviewed and are negative.  Performance status (ECOG): 0 - Asymptomatic  Vital Signs: BP (!) 151/63 (BP Location: Left Arm, Patient Position: Sitting)    Pulse 62   Temp 99.2 F (37.3 C) (Tympanic)   Resp 18   Ht 5\' 5"  (1.651 m)   Wt 183 lb 4.8 oz (83.1 kg)   SpO2 94%   BMI 30.50 kg/m   Physical Exam  Constitutional: He is oriented to person, place, and time and well-developed, well-nourished, and in no distress.  HENT:  Head: Normocephalic and atraumatic.  Gray hair and [employer].  Eyes: Conjunctivae are normal. No scleral icterus.  Blue eyes.  Neck:  LEFT neck fibrosis s/p XRT  Neurological: He is alert and oriented to person, place, and time.  Psychiatric: Mood, affect and judgment normal.  Nursing note and vitals reviewed.   Imaging studies: 02/21/2010:  PET scan revealed multiple enlarged hypermetabolic left carotid chain and supraclavicular lymph nodes. There was asymmetric hypermetabolic activity of the pharyngeal space of the left nasopharynx.   There was an indeterminate 5 mm pulmonary nodule in the left upper lobe (below resolution of PET). 06/08/2013:  Chest CT revealed bulky mediastinal adenopathy.  There was a 9 mm LUL spiculated nodule.    11/09/2015:  Chest CT revealed interval progression of interstitial and patchy alveolar opacity in the left upper lobe, in the region of the patient's previous left lung cancer, presumably post radiation.  There was interval development of interstitial and patchy alveolar opacity in the right parahilar region, mainly involving the superior segment right lower lobe. 02/15/2016:  Chest CT revealed no definite evidence of recurrent or metastatic carcinoma within the thorax.  There was stable patchy left upper lobe airspace opacity, likely due to radiation pneumonitis or post treatment scarring.  There was decreased patchy airspace disease in the right lower lobe.  There was severe hepatic steatosis. Nonalcoholic steatohepatitis could not be excluded. 08/14/2016:  Head and neck CT revealed small but increased bilateral level 4 lymph nodes near the thoracic inlet since 2016 were indeterminate for  metastatic involvement (7 mm left; 5 mm right).  PET-CT or repeat neck CT in 3-6 months might best evaluate further.  There was no other cervical lymphadenopathy and nasopharynx was within normal limits.  There was mild soft tissue asymmetry at the right base of tongue where suspicious indistinct hyper-enhancement was noted in 2016. Attention was recommended on follow-up. 08/14/2016:  Chest CT revealed new borderline mild cardiomegaly.  There was new small dependent bilateral pleural effusions. There was new mild interlobular septal thickening in both lungs. This spectrum of findings was suggestive of mild congestive heart failure.  There was a new solitary 1.0 cm enlarged right paratracheal lymph node (previously 7 mm), which was nonspecific, with recurrent carcinoma not excluded.  Consider either follow-up chest CT with IV contrast in 3 months, further evaluation with PET-CT or tissue sampling, as clinically warranted.  There was stable radiation fibrosis in the left upper lobe and parahilar lungs.  There was aortic atherosclerosis.  There was cirrhosis. 11/14/2016:  Soft tissue neck CT revealed stable small bilateral level IV / thoracic inlet lymph nodes.  There was stable mild soft tissue asymmetry along the right base of tongue.  There was  no new abnormality in the neck. 11/14/2016:  Chest CT revealed no definite findings of recurrent metastatic disease in the chest. There was no thoracic adenopathy.  The previously described mildly enlarged right paratracheal lymph node was slightly decreased and now top-normal in size.  There was new mild patchy centrilobular ground-glass micronodularity in the upper lobes bilaterally, probably a mild nonspecific infectious or inflammatory bronchiolitis. Recommend attention on follow-up chest CT.  There was stable radiation fibrosis in the bilateral parahilar lungs and left upper lobe.  There was stable borderline mild cardiomegaly.  There was small dependent bilateral  pleural effusions, decreased bilaterally.  There was mild interlobular septal thickening in the lungs, mildly decreased. Spectrum of findings suggested interval improvement in mild congestive heart failure. 05/26/2017:  Chest CT revealed no findings of active malignancy.  There were perihilar densities and bandlike density in the left upper lobe probably from prior radiation therapy. There was mild chronic airway thickening.  Prior pleural effusions had resolved.  There was a stable 4 mm peribronchovascular nodule in the right upper lobe, possibly from airway plugging but merit surveillance. 11/26/2017:  Chest CT revealed a 1.7 x 2.0 cm masslike area of architectural distortion in the region of previously treated tumor in the left upper lobe identified (previously 1.3 x 1.4 cm).  The right upper lobe lung nodule measured 8 mm (increased from 4 mm on 05/26/2017) suspicious for small developing neoplasm.  There was persistent but slightly improved increased soft tissue within the left perihilar region with surrounding changes of external beam radiation. 11/26/2017:  Soft tissue neck CT revealed a stable examination. There was no new or progressive disease. There was chronic scarring at the right tongue base related to previous treatment. 12/04/2017: PET scan revealed mass like opacity in the LUL of the lung. Area demonstrated low level hypermetabolism with a SUV of 2.6. There was low level FDG uptake in the hilar regions BILATERALLY. Findings borderline between infectious/inflammatory etiology versus neoplasm.    No visits with results within 3 Day(s) from this visit.  Latest known visit with results is:  Hospital Outpatient Visit on 12/04/2017  Component Date Value Ref Range Status  . Glucose-Capillary 12/04/2017 102* 70 - 99 mg/dL Final    Assessment:  TAVITA EASTHAM is a 62 y.o. 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  He  presented with left neck adenopathy.  Needle biopsy of the left neck adenopathy on 02/05/2010 revealed squamous cell carcinoma. Endoscopy revealed a nasopharyngeal mass.  He had clinical stage T3N2M0 (stage IVB).  PET scan on 02/21/2010 revealed multiple enlarged hypermetabolic left carotid chain and supraclavicular lymph nodes. There was asymmetric hypermetabolic activity of the pharyngeal space of the left nasopharynx.   There was an indeterminate 5 mm pulmonary nodule in the left upper lobe (below resolution of PET).  He received 3 cycles of Taxotere, cisplatin and 5-fluorouracil (TPF) (03/05/2010 - 04/18/2010).  He received weekly cetuximab (05/29/2010 - 07/17/2010) and radiation (completed 07/24/2010).    Chest CT on 06/08/2013 revealed bulky mediastinal adenopathy.  There was a 9 mm LUL spiculated nodule.   EBUS and biopsy of a right paratracheal node on 06/23/2013 revealed poorly differentiated carcinoma (lung primary versus metastasiic nasopharyngeal carcinoma). There was focal CK5/6 staining.  P63 and TTF-1 were negative.  Decision was made to treat as primary lung cancer. Pathologic stage was T1N2M0 (stage IIIB)  He received 3 weeks of full dose carboplatin and Taxol (07/07/2013 - 08/18/2013)  followed by 6 weekly cycles of carboplatin and Taxol (10/04/2013 - 11/11/2013) with radiation.   Chest CT on 11/26/2017 revealed 1.7 x 2.0 cm masslike area of architectural distortion in the region of previously treated tumor in the left upper lobe identified (previously 1.3 x 1.4 cm).  Underlying recurrent tumor cannot be excluded. Consider further evaluation with PET-CT.  The right upper lobe lung nodule measured 8 mm (increased from 4 mm on 05/26/2017) suspicious for small developing neoplasm.  There was persistent but slightly improved increased soft tissue within the left perihilar region with surrounding changes of external beam radiation.  Soft tissue neck CT on 11/26/2017 revealed a stable  examination. There was no new or progressive disease. There was chronic scarring at the right tongue base related to previous treatment.  PET scan on 12/04/2017 revealed mass like opacity in the LUL of the lung. Area demonstrated low level hypermetabolism with a SUV of 2.6. There was low level FDG uptake in the hilar regions BILATERALLY. Findings borderline between infectious/inflammatory etiology versus neoplasm.   CEA has been followed:  3.2 on 02/19/2016, 3.7 on 08/15/2016, and 3.1 on 06/03/2017.  Symptomatically, patient is doing well. He denies any acute concerns.  He is eating well; weight down another 1 pound. Exam is stable.   Plan: 1. Stage IIIB poorly differentiated lung cancer  Review PET scan. Imaging personally reviewed and shown to patient.  Images felt to be consistent with dictated radiology report.   LUL mass like opacity with low level (SUV 2.6) hypermetabolism. Infectious/inflammatory vs. neoplasm.   Discuss plans to review imaging at multidisciplinary tumor board for further recommendations on benefits of possible tissue biopsy. Will follow up with patient, via phone, following tumor board to discuss consensus.   Anticipate re-imaging in 3-6 months to assess for progression vs. stability.  2. Stage IV squamous cell carcinoma of the nasopharynx  Stable. Asymptomatic. ENT has released to PRN follow up schedule at this point.   Continue to monitor.  3. RTC will be based on tumor board discussions. Will call patient with appointment.    Honor Loh, NP  12/08/2017, 12:15 PM   I saw and evaluated the patient, participating in the key portions of the service and reviewing pertinent diagnostic studies and records.  I reviewed the nurse practitioner's note and agree with the findings and the plan.  The assessment and plan were discussed with the patient.  Multiple questions were asked by the patient and answered.   Nolon Stalls, MD 12/08/2017,12:15 PM

## 2017-12-08 ENCOUNTER — Encounter: Payer: Self-pay | Admitting: Hematology and Oncology

## 2017-12-08 ENCOUNTER — Inpatient Hospital Stay (HOSPITAL_BASED_OUTPATIENT_CLINIC_OR_DEPARTMENT_OTHER): Payer: Medicare Other | Admitting: Hematology and Oncology

## 2017-12-08 VITALS — BP 151/63 | HR 62 | Temp 99.2°F | Resp 18 | Ht 65.0 in | Wt 183.3 lb

## 2017-12-08 DIAGNOSIS — Z87891 Personal history of nicotine dependence: Secondary | ICD-10-CM

## 2017-12-08 DIAGNOSIS — Z809 Family history of malignant neoplasm, unspecified: Secondary | ICD-10-CM

## 2017-12-08 DIAGNOSIS — Z85818 Personal history of malignant neoplasm of other sites of lip, oral cavity, and pharynx: Secondary | ICD-10-CM | POA: Diagnosis not present

## 2017-12-08 DIAGNOSIS — C3412 Malignant neoplasm of upper lobe, left bronchus or lung: Secondary | ICD-10-CM | POA: Diagnosis not present

## 2017-12-08 DIAGNOSIS — C119 Malignant neoplasm of nasopharynx, unspecified: Secondary | ICD-10-CM

## 2017-12-08 NOTE — Progress Notes (Signed)
No new changes noted today 

## 2017-12-11 ENCOUNTER — Telehealth: Payer: Self-pay | Admitting: Hematology and Oncology

## 2017-12-11 ENCOUNTER — Other Ambulatory Visit: Payer: Self-pay | Admitting: Hematology and Oncology

## 2017-12-11 DIAGNOSIS — C3412 Malignant neoplasm of upper lobe, left bronchus or lung: Secondary | ICD-10-CM

## 2017-12-11 NOTE — Telephone Encounter (Signed)
Re:  Tumor board discussions  Recent CT and PET scan reviewed at tumor board.  Decision made for follow-up imaging in 6 months.  Discussed with patient.  Patient in agreement with plan.  Lequita Asal, MD

## 2018-01-13 DIAGNOSIS — C3490 Malignant neoplasm of unspecified part of unspecified bronchus or lung: Secondary | ICD-10-CM | POA: Diagnosis not present

## 2018-01-13 DIAGNOSIS — R948 Abnormal results of function studies of other organs and systems: Secondary | ICD-10-CM | POA: Diagnosis not present

## 2018-01-13 DIAGNOSIS — J209 Acute bronchitis, unspecified: Secondary | ICD-10-CM | POA: Diagnosis not present

## 2018-01-13 DIAGNOSIS — Z0000 Encounter for general adult medical examination without abnormal findings: Secondary | ICD-10-CM | POA: Diagnosis not present

## 2018-01-13 DIAGNOSIS — R5381 Other malaise: Secondary | ICD-10-CM | POA: Diagnosis not present

## 2018-01-13 DIAGNOSIS — J219 Acute bronchiolitis, unspecified: Secondary | ICD-10-CM | POA: Diagnosis not present

## 2018-01-13 DIAGNOSIS — J40 Bronchitis, not specified as acute or chronic: Secondary | ICD-10-CM | POA: Diagnosis not present

## 2018-01-13 DIAGNOSIS — R911 Solitary pulmonary nodule: Secondary | ICD-10-CM | POA: Diagnosis not present

## 2018-01-13 DIAGNOSIS — L02811 Cutaneous abscess of head [any part, except face]: Secondary | ICD-10-CM | POA: Diagnosis not present

## 2018-01-13 DIAGNOSIS — R918 Other nonspecific abnormal finding of lung field: Secondary | ICD-10-CM | POA: Diagnosis not present

## 2018-01-19 DIAGNOSIS — R5381 Other malaise: Secondary | ICD-10-CM | POA: Diagnosis not present

## 2018-01-19 DIAGNOSIS — L049 Acute lymphadenitis, unspecified: Secondary | ICD-10-CM | POA: Diagnosis not present

## 2018-01-19 DIAGNOSIS — C3490 Malignant neoplasm of unspecified part of unspecified bronchus or lung: Secondary | ICD-10-CM | POA: Diagnosis not present

## 2018-01-19 DIAGNOSIS — L02811 Cutaneous abscess of head [any part, except face]: Secondary | ICD-10-CM | POA: Diagnosis not present

## 2018-04-07 DIAGNOSIS — R600 Localized edema: Secondary | ICD-10-CM | POA: Diagnosis not present

## 2018-04-07 DIAGNOSIS — L02811 Cutaneous abscess of head [any part, except face]: Secondary | ICD-10-CM | POA: Diagnosis not present

## 2018-04-07 DIAGNOSIS — C3490 Malignant neoplasm of unspecified part of unspecified bronchus or lung: Secondary | ICD-10-CM | POA: Diagnosis not present

## 2018-04-07 DIAGNOSIS — R278 Other lack of coordination: Secondary | ICD-10-CM | POA: Diagnosis not present

## 2018-04-30 ENCOUNTER — Ambulatory Visit: Payer: Medicare Other | Admitting: Hematology and Oncology

## 2018-05-05 DIAGNOSIS — R5381 Other malaise: Secondary | ICD-10-CM | POA: Diagnosis not present

## 2018-05-05 DIAGNOSIS — R600 Localized edema: Secondary | ICD-10-CM | POA: Diagnosis not present

## 2018-05-05 DIAGNOSIS — R278 Other lack of coordination: Secondary | ICD-10-CM | POA: Diagnosis not present

## 2018-05-05 DIAGNOSIS — C3490 Malignant neoplasm of unspecified part of unspecified bronchus or lung: Secondary | ICD-10-CM | POA: Diagnosis not present

## 2018-05-27 ENCOUNTER — Ambulatory Visit: Admission: RE | Admit: 2018-05-27 | Payer: Medicare Other | Source: Ambulatory Visit

## 2018-05-29 ENCOUNTER — Other Ambulatory Visit: Payer: Self-pay | Admitting: Hematology and Oncology

## 2018-05-29 ENCOUNTER — Ambulatory Visit: Payer: Medicare Other | Admitting: Hematology and Oncology

## 2018-05-29 ENCOUNTER — Ambulatory Visit: Admission: RE | Admit: 2018-05-29 | Payer: Medicare Other | Source: Ambulatory Visit

## 2018-05-29 DIAGNOSIS — C119 Malignant neoplasm of nasopharynx, unspecified: Secondary | ICD-10-CM

## 2018-05-29 DIAGNOSIS — C3412 Malignant neoplasm of upper lobe, left bronchus or lung: Secondary | ICD-10-CM

## 2018-05-29 NOTE — Progress Notes (Deleted)
[facility] day:  05/29/2018   Chief Complaint: Jason Bryan is a 63 y.o. 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  HPI: The patient was last seen in the medical oncology clinic on 12/08/2017.  At that time, he was doing well. He denied any acute concerns.  He was eating well; weight down another 1 pound. Exam was stable.  Patient was presented at tumor board on 12/11/2017.  CT and PET scan reviewed.  Decision made for follow-up imaging in 6 months.  Chest CT on 05/29/2018 revealed xxx.  Symptomatically,    Past Medical History:  Diagnosis Date  . Cancer ([place]) 2011   squemous stage 3, head and neck Ca with chemo + rad tx's.   . Lung cancer, upper lobe ([place]) 04/11/2015  . Squamous cell lung cancer ([place]) 2015   Right Lung CA with chemo + rad tx's.    No past surgical history on file.  Family History  Problem Relation Age of Onset  . Cancer Mother     Social History:  reports that he has quit smoking. His smoking use included cigarettes. He has a 40.00 pack-year smoking history. He has never used smokeless tobacco. He reports current alcohol use of about 7.0 standard drinks of alcohol per week. He reports that he does not use drugs.  She has been alcohol free since 04/2009.  He stopped smoking 3 years ago.  He walks every day.  He lives in [place].  The patient is alone today.  Allergies: No Known Allergies  Current Medications: Current Outpatient Medications  Medication Sig Dispense Refill  . budesonide-formoterol (SYMBICORT) 160-4.5 MCG/ACT inhaler Inhale 1 puff into the lungs daily. 1 Inhaler 1  . CARTIA XT 120 MG 24 hr capsule     . furosemide (LASIX) 20 MG tablet     . lisinopril (PRINIVIL,ZESTRIL) 10 MG tablet     . temazepam (RESTORIL) 30 MG capsule     . traZODone (DESYREL) 50 MG tablet Take 1 tablet (50 mg total)  by mouth at bedtime as needed. 90 tablet 3   No current facility-administered medications for this visit.     Review of Systems  Constitutional: Positive for weight loss (down 1 pound). Negative for diaphoresis, fever and malaise/fatigue.  HENT: Negative.   Eyes: Negative.   Respiratory: Negative for cough, hemoptysis, sputum production and shortness of breath.   Cardiovascular: Negative for chest pain, palpitations, orthopnea, leg swelling and PND.  Gastrointestinal: Negative for abdominal pain, blood in stool, constipation, diarrhea, melena, nausea and vomiting.  Genitourinary: Negative for dysuria, frequency, hematuria and urgency.  Musculoskeletal: Negative for back pain, falls, joint pain and myalgias.  Skin: Negative for itching and rash.  Neurological: Negative for dizziness, tremors, weakness and headaches.  Endo/Heme/Allergies: Does not bruise/bleed easily.  Psychiatric/Behavioral: Negative for depression, memory loss and suicidal ideas. The patient is not nervous/anxious and does not have insomnia.   All other systems reviewed and are negative.  Performance status (ECOG): 0 - Asymptomatic  Vital Signs: There were no vitals taken for this visit.  Physical Exam  Constitutional: He is oriented to person, place, and time and well-developed, well-nourished, and in no distress.  HENT:  Head: Normocephalic and atraumatic.  Gray hair and [employer].  Eyes: Conjunctivae are normal. No scleral icterus.  Blue eyes.  Neck:  LEFT neck fibrosis s/p XRT  Neurological:  He is alert and oriented to person, place, and time.  Psychiatric: Mood, affect and judgment normal.  Nursing note and vitals reviewed.   Imaging studies: 02/21/2010:  PET scan revealed multiple enlarged hypermetabolic left carotid chain and supraclavicular lymph nodes. There was asymmetric hypermetabolic activity of the pharyngeal space of the left nasopharynx.   There was an indeterminate 5 mm pulmonary nodule in the  left upper lobe (below resolution of PET). 06/08/2013:  Chest CT revealed bulky mediastinal adenopathy.  There was a 9 mm LUL spiculated nodule.    11/09/2015:  Chest CT revealed interval progression of interstitial and patchy alveolar opacity in the left upper lobe, in the region of the patient's previous left lung cancer, presumably post radiation.  There was interval development of interstitial and patchy alveolar opacity in the right parahilar region, mainly involving the superior segment right lower lobe. 02/15/2016:  Chest CT revealed no definite evidence of recurrent or metastatic carcinoma within the thorax.  There was stable patchy left upper lobe airspace opacity, likely due to radiation pneumonitis or post treatment scarring.  There was decreased patchy airspace disease in the right lower lobe.  There was severe hepatic steatosis. Nonalcoholic steatohepatitis could not be excluded. 08/14/2016:  Head and neck CT revealed small but increased bilateral level 4 lymph nodes near the thoracic inlet since 2016 were indeterminate for metastatic involvement (7 mm left; 5 mm right).  PET-CT or repeat neck CT in 3-6 months might best evaluate further.  There was no other cervical lymphadenopathy and nasopharynx was within normal limits.  There was mild soft tissue asymmetry at the right base of tongue where suspicious indistinct hyper-enhancement was noted in 2016. Attention was recommended on follow-up. 08/14/2016:  Chest CT revealed new borderline mild cardiomegaly.  There was new small dependent bilateral pleural effusions. There was new mild interlobular septal thickening in both lungs. This spectrum of findings was suggestive of mild congestive heart failure.  There was a new solitary 1.0 cm enlarged right paratracheal lymph node (previously 7 mm), which was nonspecific, with recurrent carcinoma not excluded.  Consider either follow-up chest CT with IV contrast in 3 months, further evaluation with PET-CT  or tissue sampling, as clinically warranted.  There was stable radiation fibrosis in the left upper lobe and parahilar lungs.  There was aortic atherosclerosis.  There was cirrhosis. 11/14/2016:  Soft tissue neck CT revealed stable small bilateral level IV / thoracic inlet lymph nodes.  There was stable mild soft tissue asymmetry along the right base of tongue.  There was no new abnormality in the neck. 11/14/2016:  Chest CT revealed no definite findings of recurrent metastatic disease in the chest. There was no thoracic adenopathy.  The previously described mildly enlarged right paratracheal lymph node was slightly decreased and now top-normal in size.  There was new mild patchy centrilobular ground-glass micronodularity in the upper lobes bilaterally, probably a mild nonspecific infectious or inflammatory bronchiolitis. Recommend attention on follow-up chest CT.  There was stable radiation fibrosis in the bilateral parahilar lungs and left upper lobe.  There was stable borderline mild cardiomegaly.  There was small dependent bilateral pleural effusions, decreased bilaterally.  There was mild interlobular septal thickening in the lungs, mildly decreased. Spectrum of findings suggested interval improvement in mild congestive heart failure. 05/26/2017:  Chest CT revealed no findings of active malignancy.  There were perihilar densities and bandlike density in the left upper lobe probably from prior radiation therapy. There was mild chronic airway thickening.  Prior pleural  effusions had resolved.  There was a stable 4 mm peribronchovascular nodule in the right upper lobe, possibly from airway plugging but merit surveillance. 11/26/2017:  Chest CT revealed a 1.7 x 2.0 cm masslike area of architectural distortion in the region of previously treated tumor in the left upper lobe identified (previously 1.3 x 1.4 cm).  The right upper lobe lung nodule measured 8 mm (increased from 4 mm on 05/26/2017) suspicious for  small developing neoplasm.  There was persistent but slightly improved increased soft tissue within the left perihilar region with surrounding changes of external beam radiation. 11/26/2017:  Soft tissue neck CT revealed a stable examination. There was no new or progressive disease. There was chronic scarring at the right tongue base related to previous treatment. 12/04/2017: PET scan revealed mass like opacity in the LUL of the lung. Area demonstrated low level hypermetabolism with a SUV of 2.6. There was low level FDG uptake in the hilar regions BILATERALLY. Findings borderline between infectious/inflammatory etiology versus neoplasm.    No visits with results within 3 Day(s) from this visit.  Latest known visit with results is:  Hospital Outpatient Visit on 12/04/2017  Component Date Value Ref Range Status  . Glucose-Capillary 12/04/2017 102* 70 - 99 mg/dL Final    Assessment:  Jason Bryan is a 63 y.o. 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  He presented with left neck adenopathy.  Needle biopsy of the left neck adenopathy on 02/05/2010 revealed squamous cell carcinoma. Endoscopy revealed a nasopharyngeal mass.  He had clinical stage T3N2M0 (stage IVB).  PET scan on 02/21/2010 revealed multiple enlarged hypermetabolic left carotid chain and supraclavicular lymph nodes. There was asymmetric hypermetabolic activity of the pharyngeal space of the left nasopharynx.   There was an indeterminate 5 mm pulmonary nodule in the left upper lobe (below resolution of PET).  He received 3 cycles of Taxotere, cisplatin and 5-fluorouracil (TPF) (03/05/2010 - 04/18/2010).  He received weekly cetuximab (05/29/2010 - 07/17/2010) and radiation (completed 07/24/2010).    Chest CT on 06/08/2013 revealed bulky mediastinal adenopathy.  There was a 9 mm LUL spiculated nodule.   EBUS and biopsy of a right paratracheal node on 06/23/2013 revealed  poorly differentiated carcinoma (lung primary versus metastasiic nasopharyngeal carcinoma). There was focal CK5/6 staining.  P63 and TTF-1 were negative.  Decision was made to treat as primary lung cancer. Pathologic stage was T1N2M0 (stage IIIB)  He received 3 weeks of full dose carboplatin and Taxol (07/07/2013 - 08/18/2013) followed by 6 weekly cycles of carboplatin and Taxol (10/04/2013 - 11/11/2013) with radiation.   Chest CT on 11/26/2017 revealed 1.7 x 2.0 cm masslike area of architectural distortion in the region of previously treated tumor in the left upper lobe identified (previously 1.3 x 1.4 cm).  Underlying recurrent tumor cannot be excluded. Consider further evaluation with PET-CT.  The right upper lobe lung nodule measured 8 mm (increased from 4 mm on 05/26/2017) suspicious for small developing neoplasm.  There was persistent but slightly improved increased soft tissue within the left perihilar region with surrounding changes of external beam radiation.  Soft tissue neck CT on 11/26/2017 revealed a stable examination. There was no new or progressive disease. There was chronic scarring at the right tongue base related to previous treatment.  PET scan on 12/04/2017 revealed mass like opacity in the LUL of the lung. Area demonstrated low level hypermetabolism with a SUV of 2.6. There was low level FDG  uptake in the hilar regions BILATERALLY. Findings borderline between infectious/inflammatory etiology versus neoplasm.   CEA has been followed:  3.2 on 02/19/2016, 3.7 on 08/15/2016, and 3.1 on 06/03/2017.  Symptomatically, patient is doing well. He denies any acute concerns.  He is eating well; weight down another 1 pound. Exam is stable.   Plan: 1.   Labs today:  CBC with diff, CMP, CEA.  2.   Stage IIIB poorly differentiated lung cancer  Review PET scan. Imaging personally reviewed and shown to patient.  Images felt to be consistent with dictated radiology report.   LUL mass like  opacity with low level (SUV 2.6) hypermetabolism. Infectious/inflammatory vs. neoplasm.   Discuss plans to review imaging at multidisciplinary tumor board for further recommendations on benefits of possible tissue biopsy. Will follow up with patient, via phone, following tumor board to discuss consensus.   Anticipate re-imaging in 3-6 months to assess for progression vs. stability.  1. Stage IV squamous cell carcinoma of the nasopharynx  Stable. Asymptomatic. ENT has released to PRN follow up schedule at this point.   Continue to monitor.  2. RTC will be based on tumor board discussions. Will call patient with appointment.    Lequita Asal, MD  05/29/2018, 11:06 AM   I saw and evaluated the patient, participating in the key portions of the service and reviewing pertinent diagnostic studies and records.  I reviewed the nurse practitioner's note and agree with the findings and the plan.  The assessment and plan were discussed with the patient.  Multiple questions were asked by the patient and answered.   Nolon Stalls, MD 05/29/2018,11:06 AM

## 2018-06-08 ENCOUNTER — Ambulatory Visit: Admission: RE | Admit: 2018-06-08 | Payer: Medicare Other | Source: Ambulatory Visit

## 2018-06-09 ENCOUNTER — Ambulatory Visit: Payer: Medicare Other

## 2018-06-09 ENCOUNTER — Ambulatory Visit
Admission: RE | Admit: 2018-06-09 | Discharge: 2018-06-09 | Disposition: A | Discharge status: Home / Self Care | Payer: Medicare Other | Source: Ambulatory Visit | Attending: Hematology and Oncology | Admitting: Hematology and Oncology

## 2018-06-09 ENCOUNTER — Other Ambulatory Visit: Payer: Self-pay

## 2018-06-09 DIAGNOSIS — C3412 Malignant neoplasm of upper lobe, left bronchus or lung: Secondary | ICD-10-CM | POA: Diagnosis not present

## 2018-06-09 DIAGNOSIS — Z85118 Personal history of other malignant neoplasm of bronchus and lung: Secondary | ICD-10-CM | POA: Diagnosis not present

## 2018-06-09 DIAGNOSIS — R918 Other nonspecific abnormal finding of lung field: Secondary | ICD-10-CM | POA: Diagnosis not present

## 2018-06-09 LAB — POCT I-STAT CREATININE: Creatinine, Ser: 0.9 mg/dL (ref 0.61–1.24)

## 2018-06-09 MED ORDER — IOHEXOL 300 MG/ML  SOLN
75.0000 mL | Freq: Once | INTRAMUSCULAR | Status: AC | PRN
  Administered 2018-06-09: 75 mL via INTRAVENOUS

## 2018-06-12 ENCOUNTER — Other Ambulatory Visit: Payer: Self-pay

## 2018-06-12 ENCOUNTER — Inpatient Hospital Stay: Payer: Medicare Other | Attending: Hematology and Oncology | Admitting: Hematology and Oncology

## 2018-06-12 ENCOUNTER — Encounter: Payer: Self-pay | Admitting: Hematology and Oncology

## 2018-06-12 VITALS — BP 129/54 | HR 57 | Temp 99.0°F | Resp 18 | Ht 66.0 in | Wt 179.6 lb

## 2018-06-12 DIAGNOSIS — R911 Solitary pulmonary nodule: Secondary | ICD-10-CM | POA: Insufficient documentation

## 2018-06-12 DIAGNOSIS — Z809 Family history of malignant neoplasm, unspecified: Secondary | ICD-10-CM | POA: Diagnosis not present

## 2018-06-12 DIAGNOSIS — Z9221 Personal history of antineoplastic chemotherapy: Secondary | ICD-10-CM | POA: Insufficient documentation

## 2018-06-12 DIAGNOSIS — C3412 Malignant neoplasm of upper lobe, left bronchus or lung: Secondary | ICD-10-CM | POA: Diagnosis not present

## 2018-06-12 DIAGNOSIS — Z85818 Personal history of malignant neoplasm of other sites of lip, oral cavity, and pharynx: Secondary | ICD-10-CM | POA: Diagnosis not present

## 2018-06-12 DIAGNOSIS — Z923 Personal history of irradiation: Secondary | ICD-10-CM

## 2018-06-12 DIAGNOSIS — C119 Malignant neoplasm of nasopharynx, unspecified: Secondary | ICD-10-CM

## 2018-06-12 DIAGNOSIS — Z87891 Personal history of nicotine dependence: Secondary | ICD-10-CM | POA: Diagnosis not present

## 2018-06-12 DIAGNOSIS — Z7189 Other specified counseling: Secondary | ICD-10-CM

## 2018-06-12 NOTE — Progress Notes (Signed)
No new changes noted today 

## 2018-06-12 NOTE — Progress Notes (Signed)
[facility] Clinic day:  06/12/2018   Chief Complaint: Jason Bryan is a 63 y.o. 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  HPI: The patient was last seen in the medical oncology clinic on 12/08/2017.  At that time, he was doing well. He denied any acute concerns.  He was eating well; weight down another 1 pound. Exam was stable.  Patient was presented at tumor board on 12/11/2017.  CT and PET scan reviewed.  Decision made for follow-up imaging in 6 months.  Chest CT on 06/09/2018 revealed a 1.7 cm right upper lobe pulmonary nodule (previously 0.8 cm). This was highly suspicious for metastatic disease.  The posterior left upper lobe area of soft tissue density and architectural distortion was slightly increased (3.5 x 2.3 cm compared to 3.2 x 1.9 cm), moderately suspicious for recurrent/metastatic disease.  There was minimal increase in a borderline size right paratracheal node, indeterminate. Bilateral hilar soft tissue fullness was similar and nonspecific.  Symptomatically, he is doing well.  He voices no complaint.  He specifically denies any respiratory symptoms.  No pain.  Weight is down 4 pounds.  Appetite is good.   Past Medical History:  Diagnosis Date  . Cancer ([place]) 2011   squemous stage 3, head and neck Ca with chemo + rad tx's.   . Lung cancer, upper lobe ([place]) 04/11/2015  . Squamous cell lung cancer ([place]) 2015   Right Lung CA with chemo + rad tx's.    History reviewed. No pertinent surgical history.  Family History  Problem Relation Age of Onset  . Cancer Mother     Social History:  reports that he has quit smoking. His smoking use included cigarettes. He has a 40.00 pack-year smoking history. He has never used smokeless tobacco. He reports current alcohol use of about 7.0 standard drinks of alcohol per week. He reports that he  does not use drugs.  She has been alcohol free since 04/2009.  He stopped smoking 3 years ago.  He walks every day.  He lives in [place].  The patient is alone today.  Allergies: No Known Allergies  Current Medications: Current Outpatient Medications  Medication Sig Dispense Refill  . budesonide-formoterol (SYMBICORT) 160-4.5 MCG/ACT inhaler Inhale 1 puff into the lungs daily. 1 Inhaler 1  . CARTIA XT 120 MG 24 hr capsule     . temazepam (RESTORIL) 30 MG capsule     . traZODone (DESYREL) 50 MG tablet Take 1 tablet (50 mg total) by mouth at bedtime as needed. 90 tablet 3  . furosemide (LASIX) 20 MG tablet     . lisinopril (PRINIVIL,ZESTRIL) 10 MG tablet      No current facility-administered medications for this visit.     Review of Systems  Constitutional: Positive for weight loss (4 pounds). Negative for chills, diaphoresis, fever and malaise/fatigue.       Feels fine.  HENT: Negative.  Negative for congestion, ear discharge, ear pain, nosebleeds, sinus pain and sore throat.   Eyes: Negative.  Negative for blurred vision, double vision, photophobia and pain.  Respiratory: Negative.  Negative for cough, hemoptysis, sputum production, shortness of breath and wheezing.   Cardiovascular: Negative.  Negative for chest pain, palpitations, orthopnea, leg swelling and PND.  Gastrointestinal: Negative.  Negative for abdominal pain, blood in stool, constipation, diarrhea, heartburn, melena, nausea and vomiting.  Genitourinary: Negative.  Negative for dysuria, frequency, hematuria and urgency.  Musculoskeletal: Negative.  Negative for back pain, falls, joint pain, myalgias and neck pain.  Skin: Negative.  Negative for itching and rash.  Neurological: Negative.  Negative for dizziness, speech change, focal weakness, weakness and headaches.  Endo/Heme/Allergies: Negative.  Does not bruise/bleed easily.  Psychiatric/Behavioral: Negative.  Negative for depression and memory loss. The patient is not  nervous/anxious and does not have insomnia.   All other systems reviewed and are negative.  Performance status (ECOG): 0  Vital Signs: BP (!) 129/54 (BP Location: Left Arm, Patient Position: Sitting)   Pulse (!) 57   Temp 99 F (37.2 C) (Tympanic)   Resp 18   Ht 5\' 6"  (1.676 m)   Wt 179 lb 9 oz (81.4 kg)   SpO2 96%   BMI 28.98 kg/m   Physical Exam  Constitutional: He is oriented to person, place, and time and well-developed, well-nourished, and in no distress. No distress.  HENT:  Head: Normocephalic and atraumatic.  Mouth/Throat: Oropharynx is clear and moist. No oropharyngeal exudate.  Wearing a cap.  Gray hair and [employer].  Eyes: Pupils are equal, round, and reactive to light. Conjunctivae and EOM are normal. No scleral icterus.  Blue eyes.  Neck: Normal range of motion. Neck supple. No JVD present.  LEFT neck fibrosis s/p XRT.  Cardiovascular: Normal rate, normal heart sounds and intact distal pulses. Exam reveals no gallop.  No murmur heard. Pulmonary/Chest: Effort normal. No respiratory distress. He has no wheezes.  Coarse breath sounds.  Clears with cough.  Abdominal: Soft. Bowel sounds are normal. He exhibits no distension and no mass. There is no abdominal tenderness.  Musculoskeletal: Normal range of motion.        General: No tenderness or edema.  Lymphadenopathy:    He has no cervical adenopathy.  Neurological: He is alert and oriented to person, place, and time. Gait normal.  Skin: Skin is warm and dry. No rash noted. He is not diaphoretic. No erythema. No pallor.  Psychiatric: Mood, affect and judgment normal.  Nursing note and vitals reviewed.   Imaging studies: 02/21/2010:  PET scan revealed multiple enlarged hypermetabolic left carotid chain and supraclavicular lymph nodes. There was asymmetric hypermetabolic activity of the pharyngeal space of the left nasopharynx.   There was an indeterminate 5 mm pulmonary nodule in the left upper lobe (below resolution  of PET). 06/08/2013:  Chest CT revealed bulky mediastinal adenopathy.  There was a 9 mm LUL spiculated nodule.    11/09/2015:  Chest CT revealed interval progression of interstitial and patchy alveolar opacity in the left upper lobe, in the region of the patient's previous left lung cancer, presumably post radiation.  There was interval development of interstitial and patchy alveolar opacity in the right parahilar region, mainly involving the superior segment right lower lobe. 02/15/2016:  Chest CT revealed no definite evidence of recurrent or metastatic carcinoma within the thorax.  There was stable patchy left upper lobe airspace opacity, likely due to radiation pneumonitis or post treatment scarring.  There was decreased patchy airspace disease in the right lower lobe.  There was severe hepatic steatosis. Nonalcoholic steatohepatitis could not be excluded. 08/14/2016:  Head and neck CT revealed small but increased bilateral level 4 lymph nodes near the thoracic inlet since 2016 were indeterminate for metastatic involvement (7 mm left; 5 mm right).  PET-CT or repeat neck CT in 3-6 months might best evaluate further.  There was no other cervical lymphadenopathy and nasopharynx was  within normal limits.  There was mild soft tissue asymmetry at the right base of tongue where suspicious indistinct hyper-enhancement was noted in 2016. Attention was recommended on follow-up. 08/14/2016:  Chest CT revealed new borderline mild cardiomegaly.  There was new small dependent bilateral pleural effusions. There was new mild interlobular septal thickening in both lungs. This spectrum of findings was suggestive of mild congestive heart failure.  There was a new solitary 1.0 cm enlarged right paratracheal lymph node (previously 7 mm), which was nonspecific, with recurrent carcinoma not excluded.  Consider either follow-up chest CT with IV contrast in 3 months, further evaluation with PET-CT or tissue sampling, as clinically  warranted.  There was stable radiation fibrosis in the left upper lobe and parahilar lungs.  There was aortic atherosclerosis.  There was cirrhosis. 11/14/2016:  Soft tissue neck CT revealed stable small bilateral level IV / thoracic inlet lymph nodes.  There was stable mild soft tissue asymmetry along the right base of tongue.  There was no new abnormality in the neck. 11/14/2016:  Chest CT revealed no definite findings of recurrent metastatic disease in the chest. There was no thoracic adenopathy.  The previously described mildly enlarged right paratracheal lymph node was slightly decreased and now top-normal in size.  There was new mild patchy centrilobular ground-glass micronodularity in the upper lobes bilaterally, probably a mild nonspecific infectious or inflammatory bronchiolitis. Recommend attention on follow-up chest CT.  There was stable radiation fibrosis in the bilateral parahilar lungs and left upper lobe.  There was stable borderline mild cardiomegaly.  There was small dependent bilateral pleural effusions, decreased bilaterally.  There was mild interlobular septal thickening in the lungs, mildly decreased. Spectrum of findings suggested interval improvement in mild congestive heart failure. 05/26/2017:  Chest CT revealed no findings of active malignancy.  There were perihilar densities and bandlike density in the left upper lobe probably from prior radiation therapy. There was mild chronic airway thickening.  Prior pleural effusions had resolved.  There was a stable 4 mm peribronchovascular nodule in the right upper lobe, possibly from airway plugging but merit surveillance. 11/26/2017:  Chest CT revealed a 1.7 x 2.0 cm masslike area of architectural distortion in the region of previously treated tumor in the left upper lobe identified (previously 1.3 x 1.4 cm).  The right upper lobe lung nodule measured 8 mm (increased from 4 mm on 05/26/2017) suspicious for small developing neoplasm.  There was  persistent but slightly improved increased soft tissue within the left perihilar region with surrounding changes of external beam radiation. 11/26/2017:  Soft tissue neck CT revealed a stable examination. There was no new or progressive disease. There was chronic scarring at the right tongue base related to previous treatment. 12/04/2017: PET scan revealed mass like opacity in the LUL of the lung. Area demonstrated low level hypermetabolism with a SUV of 2.6. There was low level FDG uptake in the hilar regions BILATERALLY. Findings borderline between infectious/inflammatory etiology versus neoplasm.  06/09/2018:  Chest CT  revealed a 1.7 cm right upper lobe pulmonary nodule (previously 0.8 cm). This was highly suspicious for metastatic disease.  The posterior left upper lobe area of soft tissue density and architectural distortion was slightly increased (3.5 x 2.3 cm compared to 3.2 x 1.9 cm), moderately suspicious for recurrent/metastatic disease.  There was minimal increase in a borderline size right paratracheal node, indeterminate. Bilateral hilar soft tissue fullness was similar and nonspecific.   No visits with results within 3 Day(s) from this visit.  Latest known visit with results is:  Hospital Outpatient Visit on 06/09/2018  Component Date Value Ref Range Status  . Creatinine, Ser 06/09/2018 0.90  0.61 - 1.24 mg/dL Final    Assessment:  DRESHON PROFFIT is a 63 y.o. 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  He presented with left neck adenopathy.  Needle biopsy of the left neck adenopathy on 02/05/2010 revealed squamous cell carcinoma. Endoscopy revealed a nasopharyngeal mass.  He had clinical stage T3N2M0 (stage IVB).  PET scan on 02/21/2010 revealed multiple enlarged hypermetabolic left carotid chain and supraclavicular lymph nodes. There was asymmetric hypermetabolic activity of the pharyngeal space of the left  nasopharynx.   There was an indeterminate 5 mm pulmonary nodule in the left upper lobe (below resolution of PET).  He received 3 cycles of Taxotere, cisplatin and 5-fluorouracil (TPF) (03/05/2010 - 04/18/2010).  He received weekly cetuximab (05/29/2010 - 07/17/2010) and radiation (completed 07/24/2010).    Chest CT on 06/08/2013 revealed bulky mediastinal adenopathy.  There was a 9 mm LUL spiculated nodule.   EBUS and biopsy of a right paratracheal node on 06/23/2013 revealed poorly differentiated carcinoma (lung primary versus metastatic nasopharyngeal carcinoma). There was focal CK5/6 staining.  P63 and TTF-1 were negative.  Decision was made to treat as primary lung cancer. Pathologic stage was T1N2M0 (stage IIIB)  He received 3 weeks of full dose carboplatin and Taxol (07/07/2013 - 08/18/2013) followed by 6 weekly cycles of carboplatin and Taxol (10/04/2013 - 11/11/2013) with radiation.   Chest CT on 11/26/2017 revealed 1.7 x 2.0 cm masslike area of architectural distortion in the region of previously treated tumor in the left upper lobe identified (previously 1.3 x 1.4 cm).  Underlying recurrent tumor cannot be excluded. Consider further evaluation with PET-CT.  The right upper lobe lung nodule measured 8 mm (increased from 4 mm on 05/26/2017) suspicious for small developing neoplasm.  There was persistent but slightly improved increased soft tissue within the left perihilar region with surrounding changes of external beam radiation.  Soft tissue neck CT on 11/26/2017 revealed a stable examination. There was no new or progressive disease. There was chronic scarring at the right tongue base related to previous treatment.  PET scan on 12/04/2017 revealed mass like opacity in the LUL of the lung. Area demonstrated low level hypermetabolism with a SUV of 2.6. There was low level FDG uptake in the hilar regions BILATERALLY. Findings borderline between infectious/inflammatory etiology versus neoplasm.    Chest CT on 06/09/2018 revealed a 1.7 cm right upper lobe pulmonary nodule (previously 0.8 cm). This was highly suspicious for metastatic disease.  The posterior left upper lobe area of soft tissue density and architectural distortion was slightly increased (3.5 x 2.3 cm compared to 3.2 x 1.9 cm), moderately suspicious for recurrent/metastatic disease.  There was minimal increase in a borderline size right paratracheal node, indeterminate. Bilateral hilar soft tissue fullness was similar and nonspecific.  CEA has been followed:  3.2 on 02/19/2016, 3.7 on 08/15/2016, and 3.1 on 06/03/2017.  Symptomatically, he is doing well.  He voices no complaints.  Exam is unremarkable.  Plan: 1.   Stage IIIB poorly differentiated lung cancer Review chest CT.  Images reviewed with the patient.  Agree with radiology interpretation. Discuss concern for growing RUL nodule (0.8 cm to 1.7 cm) and LUL area (slightly increased) at site of previous radiation. Discuss consideration of biopsy. Discuss consideration of repeat PET scan and possible local radiation  if no evidence of metastatic disease. Discuss presentation at tumor board for consensus opinion. 2.   Stage IV squamous cell carcinoma of the nasopharynx No evidence of local recurrence. Etiology of RUL nodule possibly related to nasopharynx metastasis, new lung primary or lung metastasis. 3.   Tumor board on 06/18/2018. 4.   Call patient with plan for biopsy or radiation. 5.   RTC based on above.  Will contact patient  Addendum:  Tumor board on 06/18/2018 recommended repeat PET scan.  If no evidence of mediastinal disease or metastatic disease, patient will be referred to SBRT.   Lequita Asal, MD  06/12/2018, 4:35 PM

## 2018-06-18 ENCOUNTER — Other Ambulatory Visit: Payer: Self-pay | Admitting: Hematology and Oncology

## 2018-06-18 ENCOUNTER — Other Ambulatory Visit: Payer: Medicare Other

## 2018-06-18 DIAGNOSIS — C3412 Malignant neoplasm of upper lobe, left bronchus or lung: Secondary | ICD-10-CM

## 2018-06-18 DIAGNOSIS — C119 Malignant neoplasm of nasopharynx, unspecified: Secondary | ICD-10-CM

## 2018-06-18 NOTE — Progress Notes (Signed)
Tumor Board Documentation  Jason Bryan was presented by Dr Mike Gip at our Tumor Board on 06/18/2018, which included representatives from medical oncology, radiation oncology, surgical oncology, surgical, radiology, pathology, navigation, internal medicine, genetics, pulmonology, palliative care.  Jason Bryan currently presents as a current patient, for discussion, for Jason Bryan with history of the following treatments: active survellience, neoadjuvant chemoradiation.  Additionally, we reviewed previous medical and familial history, history of present illness, and recent lab results along with all available histopathologic and imaging studies. The tumor board considered available treatment options and made the following recommendations:   [facility] Therapy  The following procedures/referrals were also placed: No orders of the defined types were placed in this encounter.   Clinical Trial Status: not discussed   Staging used: AJCC Stage Group  AJCC Staging: T: T1 N: N2 M: M0 Group: Stage 3 B   National site-specific guidelines NCCN were discussed with respect to the case.  Tumor board is a meeting of clinicians from various specialty areas who evaluate and discuss patients for whom a multidisciplinary approach is being considered. Final determinations in the plan of care are those of the provider(s). The responsibility for follow up of recommendations given during tumor board is that of the provider.   Today's extended care, comprehensive team conference, Jason Bryan was not present for the discussion and was not examined.   Multidisciplinary Tumor Board is a multidisciplinary case peer review process.  Decisions discussed in the Multidisciplinary Tumor Board reflect the opinions of the specialists present at the conference without having examined the patient.  Ultimately, treatment and diagnostic decisions rest with the primary provider(s) and the patient.

## 2018-06-19 DIAGNOSIS — Z7189 Other specified counseling: Secondary | ICD-10-CM | POA: Insufficient documentation

## 2018-06-25 ENCOUNTER — Ambulatory Visit
Admission: RE | Admit: 2018-06-25 | Discharge: 2018-06-25 | Disposition: A | Discharge status: Home / Self Care | Payer: Medicare Other | Source: Ambulatory Visit | Attending: Hematology and Oncology | Admitting: Hematology and Oncology

## 2018-06-25 ENCOUNTER — Other Ambulatory Visit: Payer: Self-pay

## 2018-06-25 DIAGNOSIS — J90 Pleural effusion, not elsewhere classified: Secondary | ICD-10-CM | POA: Insufficient documentation

## 2018-06-25 DIAGNOSIS — C3490 Malignant neoplasm of unspecified part of unspecified bronchus or lung: Secondary | ICD-10-CM | POA: Diagnosis not present

## 2018-06-25 DIAGNOSIS — C3412 Malignant neoplasm of upper lobe, left bronchus or lung: Secondary | ICD-10-CM

## 2018-06-25 DIAGNOSIS — C119 Malignant neoplasm of nasopharynx, unspecified: Secondary | ICD-10-CM | POA: Insufficient documentation

## 2018-06-25 LAB — GLUCOSE, CAPILLARY: Glucose-Capillary: 87 mg/dL (ref 70–99)

## 2018-06-25 MED ORDER — FLUDEOXYGLUCOSE F - 18 (FDG) INJECTION
9.3000 | Freq: Once | INTRAVENOUS | Status: AC | PRN
  Administered 2018-06-25: 13:00:00 9.45 via INTRAVENOUS

## 2018-06-26 ENCOUNTER — Inpatient Hospital Stay: Payer: Medicare Other | Attending: Hematology and Oncology | Admitting: Hematology and Oncology

## 2018-06-26 ENCOUNTER — Encounter: Payer: Self-pay | Admitting: Hematology and Oncology

## 2018-06-26 DIAGNOSIS — C119 Malignant neoplasm of nasopharynx, unspecified: Secondary | ICD-10-CM

## 2018-06-26 DIAGNOSIS — C3412 Malignant neoplasm of upper lobe, left bronchus or lung: Secondary | ICD-10-CM | POA: Diagnosis not present

## 2018-06-26 NOTE — Progress Notes (Signed)
[facility]  [address], [address] [place], [place] [address] Phone: [phone]  Fax: [phone]   Telephone Office Visit:  06/26/2018  Referring physician: Cletis Athens, MD  I connected with Jason Bryan on 06/26/18 at 2:20 PM EDT by phone and verified that I was speaking with the correct person using 2 identifiers.  The patient was at home.  I discussed the limitations, risk, security and privacy concerns of performing an evaluation and management service by telephone and the availability of in person appointments.  I also discussed with the patient that there may be a patient responsible charge related to this service.  The patient expressed understanding and agreed to proceed.    Chief Complaint: Jason Bryan is a 63 y.o. 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  HPI: The patient was last seen in the medical oncology clinic on 06/11/2017.  At that time, he was doing well.  He voiced no complaints.  Exam was unremarkable.  CT scan revealed a growing RUL nodule (0.8 cm to 1.7 cm) and LUL area (slightly increased) at site of previous radiation.  We discussed consideration of biopsy or PET scan.  The patient was presented at tumor board on 06/18/2018.   Recommended was repeat PET scan.  If no evidence of mediastinal disease or metastatic disease, patient will be referred to SBRT.  PET scan on 06/25/2018 revealed a 1.7 cm right upper lobe pulmonary nodule (SUV 8.8) c/w neoplasm. Metastatic disease or lung primary would be considerations.  There was a 2.6 x 3.4 cm irregular opacity posterior left upper lobe which is slowly progressive (SUV 3.4) compared to previous PET-CT of 12/04/2017.  A low-grade or well differentiated neoplasm cannot be excluded.  There was a new subtle asymmetric focus of low level  hypermetabolism in the right tongue/floor of mouth. Direct inspection may be warranted.  There was stable appearance bilateral hilar scarring (SUV 3.0) compared to 12/04/2017.  There was a new tiny left pleural effusion  During the interim, he has done well.  He denies any complaint.   Past Medical History:  Diagnosis Date   Cancer ([place]) 2011   squemous stage 3, head and neck Ca with chemo + rad tx's.    Lung cancer, upper lobe ([place]) 04/11/2015   Squamous cell lung cancer ([place]) 2015   Right Lung CA with chemo + rad tx's.    History reviewed. No pertinent surgical history.  Family History  Problem Relation Age of Onset   Cancer Mother     Social History:  reports that he has quit smoking. His smoking use included cigarettes. He has a 40.00 pack-year smoking history. He has never used smokeless tobacco. He reports current alcohol use of about 7.0 standard drinks of alcohol per week. He reports that he does not use drugs.  She has been alcohol free since 04/2009.  He stopped smoking 3 years ago.  He walks every day.  He lives in [place].  The patient is alone today.  Participants in the patient's visit and their role in the encounter included the patient and Vito Berger, CMA, today.  The intake visit was provided by Vito Berger, CMA.    Allergies: No Known Allergies  Current Medications: Current Outpatient Medications  Medication Sig Dispense Refill   budesonide-formoterol (SYMBICORT) 160-4.5 MCG/ACT inhaler Inhale 1 puff into the  lungs daily. 1 Inhaler 1   temazepam (RESTORIL) 30 MG capsule      CARTIA XT 120 MG 24 hr capsule      furosemide (LASIX) 20 MG tablet      lisinopril (PRINIVIL,ZESTRIL) 10 MG tablet      traZODone (DESYREL) 50 MG tablet Take 1 tablet (50 mg total) by mouth at bedtime as needed. (Patient taking differently: Take 50 mg by mouth 2 (two) times daily. ) 90 tablet 3   No current facility-administered medications for this visit.      Review of Systems  Constitutional: Negative.  Negative for chills, diaphoresis, fever, malaise/fatigue and weight loss.       Feels "fine".  No concerns.  HENT: Negative.  Negative for congestion, ear discharge, ear pain, nosebleeds, sinus pain, sore throat and tinnitus.        No oral lesions.  Eyes: Negative.  Negative for blurred vision, double vision, photophobia and pain.  Respiratory: Negative.  Negative for cough, hemoptysis, sputum production, shortness of breath and wheezing.   Cardiovascular: Negative.  Negative for chest pain, palpitations, orthopnea, leg swelling and PND.  Gastrointestinal: Negative.  Negative for abdominal pain, blood in stool, constipation, diarrhea, heartburn, melena, nausea and vomiting.  Genitourinary: Negative.  Negative for dysuria, frequency, hematuria and urgency.  Musculoskeletal: Negative.  Negative for back pain, falls, joint pain, myalgias and neck pain.  Skin: Negative.  Negative for itching and rash.  Neurological: Negative.  Negative for dizziness, speech change, focal weakness, weakness and headaches.  Endo/Heme/Allergies: Negative.  Does not bruise/bleed easily.  Psychiatric/Behavioral: Negative.  Negative for depression and memory loss. The patient is not nervous/anxious and does not have insomnia.   All other systems reviewed and are negative.  Performance status (ECOG): 0   Imaging studies: 02/21/2010:  PET scan revealed multiple enlarged hypermetabolic left carotid chain and supraclavicular lymph nodes. There was asymmetric hypermetabolic activity of the pharyngeal space of the left nasopharynx.   There was an indeterminate 5 mm pulmonary nodule in the left upper lobe (below resolution of PET). 06/08/2013:  Chest CT revealed bulky mediastinal adenopathy.  There was a 9 mm LUL spiculated nodule.    11/09/2015:  Chest CT revealed interval progression of interstitial and patchy alveolar opacity in the left upper lobe, in the region of the  patient's previous left lung cancer, presumably post radiation.  There was interval development of interstitial and patchy alveolar opacity in the right parahilar region, mainly involving the superior segment right lower lobe. 02/15/2016:  Chest CT revealed no definite evidence of recurrent or metastatic carcinoma within the thorax.  There was stable patchy left upper lobe airspace opacity, likely due to radiation pneumonitis or post treatment scarring.  There was decreased patchy airspace disease in the right lower lobe.  There was severe hepatic steatosis. Nonalcoholic steatohepatitis could not be excluded. 08/14/2016:  Head and neck CT revealed small but increased bilateral level 4 lymph nodes near the thoracic inlet since 2016 were indeterminate for metastatic involvement (7 mm left; 5 mm right).  PET-CT or repeat neck CT in 3-6 months might best evaluate further.  There was no other cervical lymphadenopathy and nasopharynx was within normal limits.  There was mild soft tissue asymmetry at the right base of tongue where suspicious indistinct hyper-enhancement was noted in 2016. Attention was recommended on follow-up. 08/14/2016:  Chest CT revealed new borderline mild cardiomegaly.  There was new small dependent bilateral pleural effusions. There was new mild interlobular septal thickening in both  lungs. This spectrum of findings was suggestive of mild congestive heart failure.  There was a new solitary 1.0 cm enlarged right paratracheal lymph node (previously 7 mm), which was nonspecific, with recurrent carcinoma not excluded.  Consider either follow-up chest CT with IV contrast in 3 months, further evaluation with PET-CT or tissue sampling, as clinically warranted.  There was stable radiation fibrosis in the left upper lobe and parahilar lungs.  There was aortic atherosclerosis.  There was cirrhosis. 11/14/2016:  Soft tissue neck CT revealed stable small bilateral level IV / thoracic inlet lymph nodes.   There was stable mild soft tissue asymmetry along the right base of tongue.  There was no new abnormality in the neck. 11/14/2016:  Chest CT revealed no definite findings of recurrent metastatic disease in the chest. There was no thoracic adenopathy.  The previously described mildly enlarged right paratracheal lymph node was slightly decreased and now top-normal in size.  There was new mild patchy centrilobular ground-glass micronodularity in the upper lobes bilaterally, probably a mild nonspecific infectious or inflammatory bronchiolitis. Recommend attention on follow-up chest CT.  There was stable radiation fibrosis in the bilateral parahilar lungs and left upper lobe.  There was stable borderline mild cardiomegaly.  There was small dependent bilateral pleural effusions, decreased bilaterally.  There was mild interlobular septal thickening in the lungs, mildly decreased. Spectrum of findings suggested interval improvement in mild congestive heart failure. 05/26/2017:  Chest CT revealed no findings of active malignancy.  There were perihilar densities and bandlike density in the left upper lobe probably from prior radiation therapy. There was mild chronic airway thickening.  Prior pleural effusions had resolved.  There was a stable 4 mm peribronchovascular nodule in the right upper lobe, possibly from airway plugging but merit surveillance. 11/26/2017:  Chest CT revealed a 1.7 x 2.0 cm masslike area of architectural distortion in the region of previously treated tumor in the left upper lobe identified (previously 1.3 x 1.4 cm).  The right upper lobe lung nodule measured 8 mm (increased from 4 mm on 05/26/2017) suspicious for small developing neoplasm.  There was persistent but slightly improved increased soft tissue within the left perihilar region with surrounding changes of external beam radiation. 11/26/2017:  Soft tissue neck CT revealed a stable examination. There was no new or progressive disease. There  was chronic scarring at the right tongue base related to previous treatment. 12/04/2017: PET scan revealed mass like opacity in the LUL of the lung. Area demonstrated low level hypermetabolism with a SUV of 2.6. There was low level FDG uptake in the hilar regions BILATERALLY. Findings borderline between infectious/inflammatory etiology versus neoplasm.  06/09/2018:  Chest CT  revealed a 1.7 cm right upper lobe pulmonary nodule (previously 0.8 cm). This was highly suspicious for metastatic disease.  The posterior left upper lobe area of soft tissue density and architectural distortion was slightly increased (3.5 x 2.3 cm compared to 3.2 x 1.9 cm), moderately suspicious for recurrent/metastatic disease.  There was minimal increase in a borderline size right paratracheal node, indeterminate. Bilateral hilar soft tissue fullness was similar and nonspecific. 06/25/2018:  PET scan revealed a 1.7 cm right upper lobe pulmonary nodule (SUV 8.8) c/w neoplasm. Metastatic disease or lung primary would be considerations.  There was a 2.6 x 3.4 cm irregular opacity posterior left upper lobe which is slowly progressive (SUV 3.4) compared to previous PET-CT of 12/04/2017.  A low-grade or well differentiated neoplasm cannot be excluded.  There was a new subtle asymmetric focus  of low level hypermetabolism in the right tongue/floor of mouth. Direct inspection may be warranted.  There was stable appearance bilateral hilar scarring (SUV 3.0) compared to 12/04/2017.  There was a new tiny left pleural effusion   Hospital Outpatient Visit on 06/25/2018  Component Date Value Ref Range Status   Glucose-Capillary 06/25/2018 87  70 - 99 mg/dL Final    Assessment:  Jason Bryan is a 63 y.o. 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  He presented with left neck adenopathy.  Needle biopsy of the left neck adenopathy on 02/05/2010 revealed squamous cell  carcinoma. Endoscopy revealed a nasopharyngeal mass.  He had clinical stage T3N2M0 (stage IVB).  PET scan on 02/21/2010 revealed multiple enlarged hypermetabolic left carotid chain and supraclavicular lymph nodes. There was asymmetric hypermetabolic activity of the pharyngeal space of the left nasopharynx.   There was an indeterminate 5 mm pulmonary nodule in the left upper lobe (below resolution of PET).  He received 3 cycles of Taxotere, cisplatin and 5-fluorouracil (TPF) (03/05/2010 - 04/18/2010).  He received weekly cetuximab (05/29/2010 - 07/17/2010) and radiation (completed 07/24/2010).    Chest CT on 06/08/2013 revealed bulky mediastinal adenopathy.  There was a 9 mm LUL spiculated nodule.   EBUS and biopsy of a right paratracheal node on 06/23/2013 revealed poorly differentiated carcinoma (lung primary versus metastatic nasopharyngeal carcinoma). There was focal CK5/6 staining.  P63 and TTF-1 were negative.  Decision was made to treat as primary lung cancer. Pathologic stage was T1N2M0 (stage IIIB)  He received 3 weeks of full dose carboplatin and Taxol (07/07/2013 - 08/18/2013) followed by 6 weekly cycles of carboplatin and Taxol (10/04/2013 - 11/11/2013) with radiation.   Soft tissue neck CT on 11/26/2017 revealed a stable examination. There was no new or progressive disease. There was chronic scarring at the right tongue base related to previous treatment.  PET scan on 12/04/2017 revealed mass like opacity in the LUL of the lung (SUV of 2.6). There was low level FDG uptake in the hilar regions BILATERALLY. Findings borderline between infectious/inflammatory etiology versus neoplasm.   PET scan on 06/25/2018 revealed a 1.7 cm right upper lobe pulmonary nodule (SUV 8.8) c/w neoplasm. Metastatic disease or lung primary would be considerations.  There was a 2.6 x 3.4 cm irregular opacity posterior left upper lobe which is slowly progressive (SUV 3.4) compared to previous PET-CT of 12/04/2017.  A  low-grade or well differentiated neoplasm cannot be excluded.  There was a new subtle asymmetric focus of low level hypermetabolism in the right tongue/floor of mouth. Direct inspection may be warranted.  There was stable appearance bilateral hilar scarring (SUV 3.0) compared to 12/04/2017.  There was a new tiny left pleural effusion  CEA has been followed:  3.2 on 02/19/2016, 3.7 on 08/15/2016, and 3.1 on 06/03/2017.  Symptomatically, he denies any complaints.  Plan: 1.   Stage IIIB poorly differentiated lung cancer Review PET scan.  Images personally reviewed.  Agree with radiology interpretation. Scan reveals an lobe lesion consistent with malignancy.  Unclear significance of left upper lobe lesion but could be a low-grade neoplasm. No evidence of mediastinal disease. Review tumor board discussions. Refer to radiation oncology for SBRT. 2.   Stage IV squamous cell carcinoma of the nasopharynx Unclear significance of right tongue/floor of mouth PET + activity. Refer to ENT for direct visualization.  Patient in agreement. 3.   Consult ENT- h/o squamous cell carcinoma head and neck; PET+  subtle asymmetric focus of low level hypermetabolism in the right tongue/floor of mouth. 4.   RTC after radiation complete (patient to call).   I discussed the assessment and treatment plan with the patient.  The patient was provided an opportunity to ask questions and all were answered.  The patient agreed with the plan and demonstrated an understanding of the instructions.  The patient was advised to call back or seek an in person evaluation if the symptoms worsen or if the condition fails to improve as anticipated.  I provided 11 minutes (2:20 PM - 2:31 PM) of non-face-to-face time during this encounter.  I provided these services from the [facility] office.   Lequita Asal, MD, PhD  06/26/2018, 2:20 PM

## 2018-06-26 NOTE — Progress Notes (Signed)
No new changes noted today. The patient DOB,Name and address had verified with tele visit today

## 2018-06-29 ENCOUNTER — Ambulatory Visit
Admission: RE | Admit: 2018-06-29 | Discharge: 2018-06-29 | Disposition: A | Discharge status: Home / Self Care | Payer: Medicare Other | Source: Ambulatory Visit | Attending: Radiation Oncology | Admitting: Radiation Oncology

## 2018-06-29 ENCOUNTER — Encounter: Payer: Self-pay | Admitting: Radiation Oncology

## 2018-06-29 ENCOUNTER — Other Ambulatory Visit: Payer: Self-pay

## 2018-06-29 VITALS — BP 141/86 | HR 58 | Temp 98.9°F | Resp 20 | Wt 178.1 lb

## 2018-06-29 DIAGNOSIS — Z9221 Personal history of antineoplastic chemotherapy: Secondary | ICD-10-CM | POA: Diagnosis not present

## 2018-06-29 DIAGNOSIS — Z923 Personal history of irradiation: Secondary | ICD-10-CM | POA: Insufficient documentation

## 2018-06-29 DIAGNOSIS — Z85819 Personal history of malignant neoplasm of unspecified site of lip, oral cavity, and pharynx: Secondary | ICD-10-CM | POA: Insufficient documentation

## 2018-06-29 DIAGNOSIS — C3411 Malignant neoplasm of upper lobe, right bronchus or lung: Secondary | ICD-10-CM | POA: Insufficient documentation

## 2018-06-29 DIAGNOSIS — C3492 Malignant neoplasm of unspecified part of left bronchus or lung: Secondary | ICD-10-CM

## 2018-06-29 DIAGNOSIS — Z85118 Personal history of other malignant neoplasm of bronchus and lung: Secondary | ICD-10-CM | POA: Diagnosis not present

## 2018-06-29 DIAGNOSIS — Z87891 Personal history of nicotine dependence: Secondary | ICD-10-CM | POA: Diagnosis not present

## 2018-06-29 NOTE — Progress Notes (Signed)
Radiation Oncology Follow up Note  Name: Jason Bryan   Date:   06/29/2018 MRN:  [id number] DOB: 07-13-1955    This 63 y.o. male presents to the clinic today for SBRT to his right upper lobe for new stage I non-small cell lung cancer.  REFERRING PROVIDER: Cletis Athens, MD  HPI: Patient is a 63 year old male well-known to our department having received radiation therapy to his left lung 3 years prior for non-small cell lung cancer.  He has a previous history of nasopharyngeal carcinoma treated back in 2012 with concurrent chemoradiation therapy.  He had been doing well although we have recently seen a new lesion in his right upper lobe which is hypermetabolic and growing over time consistent with a stage I non-small cell lung lung cancer.  There is also been a 2.6 x 3.4 irregular opacity in the posterior left upper lobe slightly progression over time with low level hypermetabolic activity.  We will continue to observe this area.  He is seen today for evaluation of possible SBRT to his right upper lobe.  He is doing well specifically denies cough hemoptysis or chest tightness.  COMPLICATIONS OF TREATMENT: none  FOLLOW UP COMPLIANCE: keeps appointments   PHYSICAL EXAM:  BP (!) 141/86   Pulse (!) 58   Temp 98.9 F (37.2 C)   Resp 20   Wt 178 lb 2.1 oz (80.8 kg)   BMI 28.75 kg/m  Well-developed well-nourished patient in NAD. HEENT reveals PERLA, EOMI, discs not visualized.  Oral cavity is clear. No oral mucosal lesions are identified. Neck is clear without evidence of cervical or supraclavicular adenopathy. Lungs are clear to A&P. Cardiac examination is essentially unremarkable with regular rate and rhythm without murmur rub or thrill. Abdomen is benign with no organomegaly or masses noted. Motor sensory and DTR levels are equal and symmetric in the upper and lower extremities. Cranial nerves II through XII are grossly intact. Proprioception is intact. No peripheral adenopathy or edema is  identified. No motor or sensory levels are noted. Crude visual fields are within normal range.  RADIOLOGY RESULTS: CT scans and PET CT scans are reviewed and compatible with above-stated findings  PLAN: Case was presented at our weekly tumor conference.  We all are in agreement this is a new primary lesion most likely of his right upper lobe.  I have recommended SBRT treatment to that region.  I plan on delivering 6000 cGy in 5 fractions.  I will use for dimensional treatment planning as well as motion restriction of his abdomen for treatment planning purposes.  We will also use PET CT fusion study for treatment planning.  Risks and benefits of treatment including possible development of cough fatigue slight alteration of blood counts all were described to the patient in detail.  I have personally set up and ordered CT simulation for next week.  We will continue to follow his left lung abnormality although with the low level hypermetabolic activity this is possibly a low metabolic new primary lung cancer.  Patient comprehends my treatment plan well.  I would like to take this opportunity to thank you for allowing me to participate in the care of your patient.Noreene Filbert, MD

## 2018-07-03 ENCOUNTER — Other Ambulatory Visit: Payer: Self-pay

## 2018-07-06 ENCOUNTER — Ambulatory Visit
Admission: RE | Admit: 2018-07-06 | Discharge: 2018-07-06 | Disposition: A | Discharge status: Home / Self Care | Payer: Medicare Other | Source: Ambulatory Visit | Attending: Radiation Oncology | Admitting: Radiation Oncology

## 2018-07-06 ENCOUNTER — Other Ambulatory Visit: Payer: Self-pay

## 2018-07-06 DIAGNOSIS — Z85118 Personal history of other malignant neoplasm of bronchus and lung: Secondary | ICD-10-CM | POA: Diagnosis not present

## 2018-07-06 DIAGNOSIS — Z510 Encounter for antineoplastic radiation therapy: Secondary | ICD-10-CM | POA: Insufficient documentation

## 2018-07-06 DIAGNOSIS — C3411 Malignant neoplasm of upper lobe, right bronchus or lung: Secondary | ICD-10-CM | POA: Insufficient documentation

## 2018-07-06 DIAGNOSIS — Z87891 Personal history of nicotine dependence: Secondary | ICD-10-CM | POA: Diagnosis not present

## 2018-07-07 DIAGNOSIS — Z510 Encounter for antineoplastic radiation therapy: Secondary | ICD-10-CM | POA: Diagnosis not present

## 2018-07-07 DIAGNOSIS — C3411 Malignant neoplasm of upper lobe, right bronchus or lung: Secondary | ICD-10-CM | POA: Diagnosis not present

## 2018-07-07 DIAGNOSIS — Z85118 Personal history of other malignant neoplasm of bronchus and lung: Secondary | ICD-10-CM | POA: Diagnosis not present

## 2018-07-07 DIAGNOSIS — Z87891 Personal history of nicotine dependence: Secondary | ICD-10-CM | POA: Diagnosis not present

## 2018-07-20 ENCOUNTER — Other Ambulatory Visit: Payer: Self-pay

## 2018-07-20 ENCOUNTER — Ambulatory Visit
Admission: RE | Admit: 2018-07-20 | Discharge: 2018-07-20 | Disposition: A | Discharge status: Home / Self Care | Payer: Medicare Other | Source: Ambulatory Visit | Attending: Radiation Oncology | Admitting: Radiation Oncology

## 2018-07-20 DIAGNOSIS — Z510 Encounter for antineoplastic radiation therapy: Secondary | ICD-10-CM | POA: Insufficient documentation

## 2018-07-20 DIAGNOSIS — C3411 Malignant neoplasm of upper lobe, right bronchus or lung: Secondary | ICD-10-CM | POA: Insufficient documentation

## 2018-07-22 ENCOUNTER — Ambulatory Visit
Admission: RE | Admit: 2018-07-22 | Discharge: 2018-07-22 | Disposition: A | Discharge status: Home / Self Care | Payer: Medicare Other | Source: Ambulatory Visit | Attending: Radiation Oncology | Admitting: Radiation Oncology

## 2018-07-22 ENCOUNTER — Other Ambulatory Visit: Payer: Self-pay

## 2018-07-22 DIAGNOSIS — C3411 Malignant neoplasm of upper lobe, right bronchus or lung: Secondary | ICD-10-CM | POA: Diagnosis not present

## 2018-07-22 DIAGNOSIS — Z510 Encounter for antineoplastic radiation therapy: Secondary | ICD-10-CM | POA: Diagnosis not present

## 2018-07-27 ENCOUNTER — Other Ambulatory Visit: Payer: Self-pay

## 2018-07-27 ENCOUNTER — Ambulatory Visit
Admission: RE | Admit: 2018-07-27 | Discharge: 2018-07-27 | Disposition: A | Discharge status: Home / Self Care | Payer: Medicare Other | Source: Ambulatory Visit | Attending: Radiation Oncology | Admitting: Radiation Oncology

## 2018-07-27 DIAGNOSIS — C3411 Malignant neoplasm of upper lobe, right bronchus or lung: Secondary | ICD-10-CM | POA: Diagnosis not present

## 2018-07-27 DIAGNOSIS — Z510 Encounter for antineoplastic radiation therapy: Secondary | ICD-10-CM | POA: Diagnosis not present

## 2018-07-29 ENCOUNTER — Ambulatory Visit
Admission: RE | Admit: 2018-07-29 | Discharge: 2018-07-29 | Disposition: A | Discharge status: Home / Self Care | Payer: Medicare Other | Source: Ambulatory Visit | Attending: Radiation Oncology | Admitting: Radiation Oncology

## 2018-07-29 ENCOUNTER — Other Ambulatory Visit: Payer: Self-pay

## 2018-07-29 DIAGNOSIS — C3411 Malignant neoplasm of upper lobe, right bronchus or lung: Secondary | ICD-10-CM | POA: Diagnosis not present

## 2018-07-29 DIAGNOSIS — Z510 Encounter for antineoplastic radiation therapy: Secondary | ICD-10-CM | POA: Diagnosis not present

## 2018-08-03 ENCOUNTER — Ambulatory Visit
Admission: RE | Admit: 2018-08-03 | Discharge: 2018-08-03 | Disposition: A | Discharge status: Home / Self Care | Payer: Medicare Other | Source: Ambulatory Visit | Attending: Radiation Oncology | Admitting: Radiation Oncology

## 2018-08-03 ENCOUNTER — Other Ambulatory Visit: Payer: Self-pay

## 2018-08-03 DIAGNOSIS — C3411 Malignant neoplasm of upper lobe, right bronchus or lung: Secondary | ICD-10-CM | POA: Diagnosis not present

## 2018-08-03 DIAGNOSIS — Z85118 Personal history of other malignant neoplasm of bronchus and lung: Secondary | ICD-10-CM | POA: Diagnosis not present

## 2018-08-03 DIAGNOSIS — Z87891 Personal history of nicotine dependence: Secondary | ICD-10-CM | POA: Diagnosis not present

## 2018-08-03 DIAGNOSIS — Z510 Encounter for antineoplastic radiation therapy: Secondary | ICD-10-CM | POA: Diagnosis not present

## 2018-08-07 ENCOUNTER — Other Ambulatory Visit: Payer: Self-pay | Admitting: Internal Medicine

## 2018-08-07 ENCOUNTER — Other Ambulatory Visit: Payer: Self-pay

## 2018-08-07 ENCOUNTER — Ambulatory Visit
Admission: RE | Admit: 2018-08-07 | Discharge: 2018-08-07 | Disposition: A | Discharge status: Home / Self Care | Payer: Medicare Other | Source: Ambulatory Visit | Attending: Internal Medicine | Admitting: Internal Medicine

## 2018-08-07 DIAGNOSIS — F5104 Psychophysiologic insomnia: Secondary | ICD-10-CM | POA: Diagnosis not present

## 2018-08-07 DIAGNOSIS — R059 Cough, unspecified: Secondary | ICD-10-CM

## 2018-08-07 DIAGNOSIS — R0602 Shortness of breath: Secondary | ICD-10-CM | POA: Insufficient documentation

## 2018-08-07 DIAGNOSIS — R05 Cough: Secondary | ICD-10-CM

## 2018-08-07 DIAGNOSIS — J209 Acute bronchitis, unspecified: Secondary | ICD-10-CM | POA: Diagnosis not present

## 2018-08-07 DIAGNOSIS — R062 Wheezing: Secondary | ICD-10-CM | POA: Diagnosis not present

## 2018-08-07 DIAGNOSIS — R600 Localized edema: Secondary | ICD-10-CM | POA: Diagnosis not present

## 2018-08-07 DIAGNOSIS — C3490 Malignant neoplasm of unspecified part of unspecified bronchus or lung: Secondary | ICD-10-CM | POA: Diagnosis not present

## 2018-08-08 ENCOUNTER — Telehealth: Payer: Self-pay | Admitting: Internal Medicine

## 2018-08-08 NOTE — Telephone Encounter (Signed)
Left detail message that Dr Lavera Guise called me back late last night while I was driving and decided he wanted to treat him to cover pneumonia. I asked him to give me a call to my cell phone since I dont have a pharmacy to call rx to( his records are not in epic)

## 2018-08-14 DIAGNOSIS — R600 Localized edema: Secondary | ICD-10-CM | POA: Diagnosis not present

## 2018-08-14 DIAGNOSIS — C3490 Malignant neoplasm of unspecified part of unspecified bronchus or lung: Secondary | ICD-10-CM | POA: Diagnosis not present

## 2018-08-14 DIAGNOSIS — J209 Acute bronchitis, unspecified: Secondary | ICD-10-CM | POA: Diagnosis not present

## 2018-08-14 DIAGNOSIS — F5104 Psychophysiologic insomnia: Secondary | ICD-10-CM | POA: Diagnosis not present

## 2018-08-21 DIAGNOSIS — R600 Localized edema: Secondary | ICD-10-CM | POA: Diagnosis not present

## 2018-08-21 DIAGNOSIS — C3490 Malignant neoplasm of unspecified part of unspecified bronchus or lung: Secondary | ICD-10-CM | POA: Diagnosis not present

## 2018-08-21 DIAGNOSIS — R5381 Other malaise: Secondary | ICD-10-CM | POA: Diagnosis not present

## 2018-08-21 DIAGNOSIS — R278 Other lack of coordination: Secondary | ICD-10-CM | POA: Diagnosis not present

## 2018-09-04 DIAGNOSIS — R5381 Other malaise: Secondary | ICD-10-CM | POA: Diagnosis not present

## 2018-09-04 DIAGNOSIS — I10 Essential (primary) hypertension: Secondary | ICD-10-CM | POA: Diagnosis not present

## 2018-09-04 DIAGNOSIS — E7849 Other hyperlipidemia: Secondary | ICD-10-CM | POA: Diagnosis not present

## 2018-09-04 DIAGNOSIS — I509 Heart failure, unspecified: Secondary | ICD-10-CM | POA: Diagnosis not present

## 2018-09-04 DIAGNOSIS — C3490 Malignant neoplasm of unspecified part of unspecified bronchus or lung: Secondary | ICD-10-CM | POA: Diagnosis not present

## 2018-09-04 DIAGNOSIS — E034 Atrophy of thyroid (acquired): Secondary | ICD-10-CM | POA: Diagnosis not present

## 2018-09-08 ENCOUNTER — Other Ambulatory Visit: Payer: Self-pay

## 2018-09-09 ENCOUNTER — Other Ambulatory Visit: Payer: Self-pay | Admitting: *Deleted

## 2018-09-09 ENCOUNTER — Other Ambulatory Visit: Payer: Self-pay

## 2018-09-09 ENCOUNTER — Ambulatory Visit
Admission: RE | Admit: 2018-09-09 | Discharge: 2018-09-09 | Disposition: A | Discharge status: Home / Self Care | Payer: Medicare Other | Source: Ambulatory Visit | Attending: Radiation Oncology | Admitting: Radiation Oncology

## 2018-09-09 ENCOUNTER — Encounter: Payer: Self-pay | Admitting: Radiation Oncology

## 2018-09-09 VITALS — BP 144/91 | HR 86 | Temp 98.4°F | Resp 20 | Wt 180.2 lb

## 2018-09-09 DIAGNOSIS — C3411 Malignant neoplasm of upper lobe, right bronchus or lung: Secondary | ICD-10-CM | POA: Diagnosis not present

## 2018-09-09 DIAGNOSIS — Z87891 Personal history of nicotine dependence: Secondary | ICD-10-CM | POA: Diagnosis not present

## 2018-09-09 DIAGNOSIS — Z923 Personal history of irradiation: Secondary | ICD-10-CM | POA: Insufficient documentation

## 2018-09-09 DIAGNOSIS — Z85818 Personal history of malignant neoplasm of other sites of lip, oral cavity, and pharynx: Secondary | ICD-10-CM | POA: Insufficient documentation

## 2018-09-09 NOTE — Progress Notes (Signed)
Radiation Oncology Follow up Note  Name: Jason Bryan   Date:   09/09/2018 MRN:  [id number] DOB: 02/06/1956    This 63 y.o. male presents to the clinic today for 1 month follow-up status post SBRT to his right upper lobe for a stage I non-small cell lung cancer.  REFERRING PROVIDER: Cletis Athens, MD  HPI: Patient is a 63 year old male now at 1 month having completed SBRT to his right upper lobe for a new stage I non-small cell lung cancer.  He received radiation therapy to his lungs approximately 3 years prior for non-small cell lung cancer.  He also has a history of nasopharyngeal carcinoma treated back in 2012.  He is seen today in routine follow-up is doing well specifically denies head and neck pain or dysphasia.  Specifically [place] cough hemoptysis or chest tightness.  He has had no imaging studies at this time..  COMPLICATIONS OF TREATMENT: none  FOLLOW UP COMPLIANCE: keeps appointments   PHYSICAL EXAM:  BP (!) 144/91 (BP Location: Left Arm, Patient Position: Sitting)   Pulse 86   Temp 98.4 F (36.9 C) (Tympanic)   Resp 20   Wt 180 lb 3.6 oz (81.8 kg)   BMI 29.09 kg/m  Well-developed well-nourished patient in NAD. HEENT reveals PERLA, EOMI, discs not visualized.  Oral cavity is clear. No oral mucosal lesions are identified. Neck is clear without evidence of cervical or supraclavicular adenopathy. Lungs are clear to A&P. Cardiac examination is essentially unremarkable with regular rate and rhythm without murmur rub or thrill. Abdomen is benign with no organomegaly or masses noted. Motor sensory and DTR levels are equal and symmetric in the upper and lower extremities. Cranial nerves II through XII are grossly intact. Proprioception is intact. No peripheral adenopathy or edema is identified. No motor or sensory levels are noted. Crude visual fields are within normal range.  RADIOLOGY RESULTS: CT scan has been ordered  PLAN: Present time he is doing well with extremely low side  effect profile from his SBRT.  I am pleased with his overall progress.  Of asked to see him back in 4 months for follow-up with a CT scan of the chest prior to that visit.  Patient knows to call sooner with any concerns.  I would like to take this opportunity to thank you for allowing me to participate in the care of your patient.Noreene Filbert, MD

## 2018-10-02 DIAGNOSIS — C3490 Malignant neoplasm of unspecified part of unspecified bronchus or lung: Secondary | ICD-10-CM | POA: Diagnosis not present

## 2018-10-02 DIAGNOSIS — I10 Essential (primary) hypertension: Secondary | ICD-10-CM | POA: Diagnosis not present

## 2018-11-04 DIAGNOSIS — R5381 Other malaise: Secondary | ICD-10-CM | POA: Diagnosis not present

## 2018-11-04 DIAGNOSIS — I10 Essential (primary) hypertension: Secondary | ICD-10-CM | POA: Diagnosis not present

## 2018-11-04 DIAGNOSIS — C3490 Malignant neoplasm of unspecified part of unspecified bronchus or lung: Secondary | ICD-10-CM | POA: Diagnosis not present

## 2018-11-04 DIAGNOSIS — R278 Other lack of coordination: Secondary | ICD-10-CM | POA: Diagnosis not present

## 2018-11-12 ENCOUNTER — Ambulatory Visit: Payer: Medicare Other | Admitting: Radiation Oncology

## 2018-11-18 ENCOUNTER — Other Ambulatory Visit: Payer: Self-pay

## 2018-11-18 ENCOUNTER — Emergency Department: Payer: Medicare Other

## 2018-11-18 ENCOUNTER — Inpatient Hospital Stay
Admission: EM | Admit: 2018-11-18 | Discharge: 2018-11-27 | DRG: 286 | Disposition: A | Discharge status: Hospice | Payer: Medicare Other | Attending: Internal Medicine | Admitting: Internal Medicine

## 2018-11-18 ENCOUNTER — Inpatient Hospital Stay (HOSPITAL_COMMUNITY)
Admit: 2018-11-18 | Discharge: 2018-11-18 | Disposition: A | Discharge status: Home / Self Care | Payer: Medicare Other | Attending: Internal Medicine | Admitting: Internal Medicine

## 2018-11-18 DIAGNOSIS — Z716 Tobacco abuse counseling: Secondary | ICD-10-CM | POA: Diagnosis not present

## 2018-11-18 DIAGNOSIS — E876 Hypokalemia: Secondary | ICD-10-CM | POA: Diagnosis present

## 2018-11-18 DIAGNOSIS — Z9221 Personal history of antineoplastic chemotherapy: Secondary | ICD-10-CM

## 2018-11-18 DIAGNOSIS — I340 Nonrheumatic mitral (valve) insufficiency: Secondary | ICD-10-CM

## 2018-11-18 DIAGNOSIS — I700 Atherosclerosis of aorta: Secondary | ICD-10-CM | POA: Diagnosis present

## 2018-11-18 DIAGNOSIS — I5021 Acute systolic (congestive) heart failure: Secondary | ICD-10-CM | POA: Diagnosis not present

## 2018-11-18 DIAGNOSIS — I130 Hypertensive heart and chronic kidney disease with heart failure and stage 1 through stage 4 chronic kidney disease, or unspecified chronic kidney disease: Principal | ICD-10-CM | POA: Diagnosis present

## 2018-11-18 DIAGNOSIS — G9340 Encephalopathy, unspecified: Secondary | ICD-10-CM

## 2018-11-18 DIAGNOSIS — Z79899 Other long term (current) drug therapy: Secondary | ICD-10-CM

## 2018-11-18 DIAGNOSIS — Z952 Presence of prosthetic heart valve: Secondary | ICD-10-CM | POA: Diagnosis not present

## 2018-11-18 DIAGNOSIS — R570 Cardiogenic shock: Secondary | ICD-10-CM | POA: Diagnosis present

## 2018-11-18 DIAGNOSIS — R4702 Dysphasia: Secondary | ICD-10-CM | POA: Diagnosis not present

## 2018-11-18 DIAGNOSIS — I5023 Acute on chronic systolic (congestive) heart failure: Secondary | ICD-10-CM | POA: Diagnosis not present

## 2018-11-18 DIAGNOSIS — Z923 Personal history of irradiation: Secondary | ICD-10-CM

## 2018-11-18 DIAGNOSIS — N179 Acute kidney failure, unspecified: Secondary | ICD-10-CM | POA: Diagnosis present

## 2018-11-18 DIAGNOSIS — R9431 Abnormal electrocardiogram [ECG] [EKG]: Secondary | ICD-10-CM | POA: Diagnosis not present

## 2018-11-18 DIAGNOSIS — R0602 Shortness of breath: Secondary | ICD-10-CM

## 2018-11-18 DIAGNOSIS — R05 Cough: Secondary | ICD-10-CM

## 2018-11-18 DIAGNOSIS — W19XXXA Unspecified fall, initial encounter: Secondary | ICD-10-CM | POA: Diagnosis not present

## 2018-11-18 DIAGNOSIS — R1310 Dysphagia, unspecified: Secondary | ICD-10-CM | POA: Diagnosis present

## 2018-11-18 DIAGNOSIS — I248 Other forms of acute ischemic heart disease: Secondary | ICD-10-CM | POA: Diagnosis present

## 2018-11-18 DIAGNOSIS — I2510 Atherosclerotic heart disease of native coronary artery without angina pectoris: Secondary | ICD-10-CM | POA: Diagnosis present

## 2018-11-18 DIAGNOSIS — R059 Cough, unspecified: Secondary | ICD-10-CM

## 2018-11-18 DIAGNOSIS — I472 Ventricular tachycardia: Secondary | ICD-10-CM | POA: Diagnosis not present

## 2018-11-18 DIAGNOSIS — R600 Localized edema: Secondary | ICD-10-CM | POA: Diagnosis not present

## 2018-11-18 DIAGNOSIS — R0902 Hypoxemia: Secondary | ICD-10-CM | POA: Diagnosis not present

## 2018-11-18 DIAGNOSIS — I420 Dilated cardiomyopathy: Secondary | ICD-10-CM | POA: Diagnosis not present

## 2018-11-18 DIAGNOSIS — Z809 Family history of malignant neoplasm, unspecified: Secondary | ICD-10-CM

## 2018-11-18 DIAGNOSIS — G9341 Metabolic encephalopathy: Secondary | ICD-10-CM | POA: Diagnosis present

## 2018-11-18 DIAGNOSIS — R609 Edema, unspecified: Secondary | ICD-10-CM | POA: Diagnosis not present

## 2018-11-18 DIAGNOSIS — J431 Panlobular emphysema: Secondary | ICD-10-CM | POA: Diagnosis not present

## 2018-11-18 DIAGNOSIS — J9690 Respiratory failure, unspecified, unspecified whether with hypoxia or hypercapnia: Secondary | ICD-10-CM | POA: Diagnosis not present

## 2018-11-18 DIAGNOSIS — J9811 Atelectasis: Secondary | ICD-10-CM | POA: Diagnosis present

## 2018-11-18 DIAGNOSIS — J9602 Acute respiratory failure with hypercapnia: Secondary | ICD-10-CM | POA: Diagnosis not present

## 2018-11-18 DIAGNOSIS — Z20828 Contact with and (suspected) exposure to other viral communicable diseases: Secondary | ICD-10-CM | POA: Diagnosis present

## 2018-11-18 DIAGNOSIS — I5084 End stage heart failure: Secondary | ICD-10-CM | POA: Diagnosis present

## 2018-11-18 DIAGNOSIS — R4020 Unspecified coma: Secondary | ICD-10-CM | POA: Diagnosis not present

## 2018-11-18 DIAGNOSIS — Z7189 Other specified counseling: Secondary | ICD-10-CM | POA: Diagnosis not present

## 2018-11-18 DIAGNOSIS — F17210 Nicotine dependence, cigarettes, uncomplicated: Secondary | ICD-10-CM | POA: Diagnosis present

## 2018-11-18 DIAGNOSIS — J441 Chronic obstructive pulmonary disease with (acute) exacerbation: Secondary | ICD-10-CM | POA: Diagnosis not present

## 2018-11-18 DIAGNOSIS — Z7401 Bed confinement status: Secondary | ICD-10-CM | POA: Diagnosis not present

## 2018-11-18 DIAGNOSIS — T380X5A Adverse effect of glucocorticoids and synthetic analogues, initial encounter: Secondary | ICD-10-CM | POA: Diagnosis present

## 2018-11-18 DIAGNOSIS — E875 Hyperkalemia: Secondary | ICD-10-CM | POA: Diagnosis present

## 2018-11-18 DIAGNOSIS — I4891 Unspecified atrial fibrillation: Secondary | ICD-10-CM | POA: Diagnosis present

## 2018-11-18 DIAGNOSIS — Z515 Encounter for palliative care: Secondary | ICD-10-CM | POA: Diagnosis not present

## 2018-11-18 DIAGNOSIS — R000 Tachycardia, unspecified: Secondary | ICD-10-CM | POA: Diagnosis present

## 2018-11-18 DIAGNOSIS — I428 Other cardiomyopathies: Secondary | ICD-10-CM | POA: Diagnosis not present

## 2018-11-18 DIAGNOSIS — I25118 Atherosclerotic heart disease of native coronary artery with other forms of angina pectoris: Secondary | ICD-10-CM | POA: Diagnosis not present

## 2018-11-18 DIAGNOSIS — G4700 Insomnia, unspecified: Secondary | ICD-10-CM | POA: Diagnosis present

## 2018-11-18 DIAGNOSIS — I509 Heart failure, unspecified: Secondary | ICD-10-CM

## 2018-11-18 DIAGNOSIS — I426 Alcoholic cardiomyopathy: Secondary | ICD-10-CM | POA: Diagnosis present

## 2018-11-18 DIAGNOSIS — J432 Centrilobular emphysema: Secondary | ICD-10-CM | POA: Diagnosis not present

## 2018-11-18 DIAGNOSIS — I361 Nonrheumatic tricuspid (valve) insufficiency: Secondary | ICD-10-CM

## 2018-11-18 DIAGNOSIS — J690 Pneumonitis due to inhalation of food and vomit: Secondary | ICD-10-CM | POA: Diagnosis present

## 2018-11-18 DIAGNOSIS — J9600 Acute respiratory failure, unspecified whether with hypoxia or hypercapnia: Secondary | ICD-10-CM

## 2018-11-18 DIAGNOSIS — C3490 Malignant neoplasm of unspecified part of unspecified bronchus or lung: Secondary | ICD-10-CM | POA: Diagnosis not present

## 2018-11-18 DIAGNOSIS — M2550 Pain in unspecified joint: Secondary | ICD-10-CM | POA: Diagnosis not present

## 2018-11-18 DIAGNOSIS — D72829 Elevated white blood cell count, unspecified: Secondary | ICD-10-CM | POA: Diagnosis present

## 2018-11-18 DIAGNOSIS — J9601 Acute respiratory failure with hypoxia: Secondary | ICD-10-CM | POA: Diagnosis not present

## 2018-11-18 DIAGNOSIS — C3411 Malignant neoplasm of upper lobe, right bronchus or lung: Secondary | ICD-10-CM | POA: Diagnosis present

## 2018-11-18 DIAGNOSIS — N189 Chronic kidney disease, unspecified: Secondary | ICD-10-CM | POA: Diagnosis present

## 2018-11-18 DIAGNOSIS — Z85818 Personal history of malignant neoplasm of other sites of lip, oral cavity, and pharynx: Secondary | ICD-10-CM

## 2018-11-18 DIAGNOSIS — C119 Malignant neoplasm of nasopharynx, unspecified: Secondary | ICD-10-CM | POA: Diagnosis present

## 2018-11-18 DIAGNOSIS — I110 Hypertensive heart disease with heart failure: Secondary | ICD-10-CM | POA: Diagnosis not present

## 2018-11-18 DIAGNOSIS — Z7951 Long term (current) use of inhaled steroids: Secondary | ICD-10-CM

## 2018-11-18 LAB — CBC WITH DIFFERENTIAL/PLATELET
Abs Immature Granulocytes: 0.06 10*3/uL (ref 0.00–0.07)
Basophils Absolute: 0 10*3/uL (ref 0.0–0.1)
Basophils Relative: 0 %
Eosinophils Absolute: 0.1 10*3/uL (ref 0.0–0.5)
Eosinophils Relative: 1 %
HCT: 48.4 % (ref 39.0–52.0)
Hemoglobin: 16.1 g/dL (ref 13.0–17.0)
Immature Granulocytes: 1 %
Lymphocytes Relative: 7 %
Lymphs Abs: 0.6 10*3/uL — ABNORMAL LOW (ref 0.7–4.0)
MCH: 28.4 pg (ref 26.0–34.0)
MCHC: 33.3 g/dL (ref 30.0–36.0)
MCV: 85.4 fL (ref 80.0–100.0)
Monocytes Absolute: 0.7 10*3/uL (ref 0.1–1.0)
Monocytes Relative: 8 %
Neutro Abs: 7.2 10*3/uL (ref 1.7–7.7)
Neutrophils Relative %: 83 %
Platelets: 240 10*3/uL (ref 150–400)
RBC: 5.67 MIL/uL (ref 4.22–5.81)
RDW: 17.1 % — ABNORMAL HIGH (ref 11.5–15.5)
WBC: 8.7 10*3/uL (ref 4.0–10.5)
nRBC: 0 % (ref 0.0–0.2)

## 2018-11-18 LAB — BASIC METABOLIC PANEL
Anion gap: 11 (ref 5–15)
BUN: 42 mg/dL — ABNORMAL HIGH (ref 8–23)
CO2: 32 mmol/L (ref 22–32)
Calcium: 9.1 mg/dL (ref 8.9–10.3)
Chloride: 96 mmol/L — ABNORMAL LOW (ref 98–111)
Creatinine, Ser: 1.17 mg/dL (ref 0.61–1.24)
GFR calc Af Amer: >60 mL/min (ref >60)
GFR calc non Af Amer: >60 mL/min (ref >60)
Glucose, Bld: 106 mg/dL — ABNORMAL HIGH (ref 70–99)
Potassium: 4.8 mmol/L (ref 3.5–5.1)
Sodium: 139 mmol/L (ref 135–145)

## 2018-11-18 LAB — TROPONIN I (HIGH SENSITIVITY)
Troponin I (High Sensitivity): 45 ng/L — ABNORMAL HIGH (ref <18)
Troponin I (High Sensitivity): 42 ng/L — ABNORMAL HIGH (ref <18)

## 2018-11-18 LAB — SARS CORONAVIRUS 2 BY RT PCR (HOSPITAL ORDER, PERFORMED IN ~~LOC~~ HOSPITAL LAB): SARS Coronavirus 2: NEGATIVE

## 2018-11-18 LAB — BRAIN NATRIURETIC PEPTIDE: B Natriuretic Peptide: 3146 pg/mL — ABNORMAL HIGH (ref 0.0–100.0)

## 2018-11-18 MED ORDER — AZITHROMYCIN 250 MG PO TABS
250.0000 mg | ORAL_TABLET | Freq: Every day | ORAL | Status: AC
  Administered 2018-11-19 – 2018-11-22 (×3): 250 mg via ORAL
  Filled 2018-11-18 (×4): qty 1

## 2018-11-18 MED ORDER — TRAZODONE HCL 100 MG PO TABS
100.0000 mg | ORAL_TABLET | Freq: Every day | ORAL | Status: DC
  Administered 2018-11-18 – 2018-11-23 (×6): 100 mg via ORAL
  Filled 2018-11-18 (×6): qty 1

## 2018-11-18 MED ORDER — PERFLUTREN LIPID MICROSPHERE
1.0000 mL | INTRAVENOUS | Status: AC | PRN
  Administered 2018-11-18: 2 mL via INTRAVENOUS
  Filled 2018-11-18: qty 10

## 2018-11-18 MED ORDER — IPRATROPIUM-ALBUTEROL 0.5-2.5 (3) MG/3ML IN SOLN
3.0000 mL | RESPIRATORY_TRACT | Status: DC | PRN
  Administered 2018-11-18: 3 mL via RESPIRATORY_TRACT
  Filled 2018-11-18: qty 3

## 2018-11-18 MED ORDER — METHYLPREDNISOLONE SODIUM SUCC 125 MG IJ SOLR
60.0000 mg | Freq: Three times a day (TID) | INTRAMUSCULAR | Status: DC
  Administered 2018-11-18 – 2018-11-24 (×16): 60 mg via INTRAVENOUS
  Filled 2018-11-18 (×15): qty 2

## 2018-11-18 MED ORDER — LISINOPRIL 10 MG PO TABS
10.0000 mg | ORAL_TABLET | Freq: Every day | ORAL | Status: DC
  Administered 2018-11-18: 10 mg via ORAL
  Filled 2018-11-18: qty 1

## 2018-11-18 MED ORDER — ALBUTEROL SULFATE HFA 108 (90 BASE) MCG/ACT IN AERS: 1.0000 | INHALATION_SPRAY | RESPIRATORY_TRACT | Status: DC | PRN

## 2018-11-18 MED ORDER — FUROSEMIDE 10 MG/ML IJ SOLN
40.0000 mg | Freq: Two times a day (BID) | INTRAMUSCULAR | Status: DC
  Administered 2018-11-18: 40 mg via INTRAVENOUS
  Filled 2018-11-18: qty 4

## 2018-11-18 MED ORDER — TEMAZEPAM 15 MG PO CAPS
30.0000 mg | ORAL_CAPSULE | Freq: Every day | ORAL | Status: DC
  Administered 2018-11-18 – 2018-11-23 (×6): 30 mg via ORAL
  Filled 2018-11-18 (×7): qty 2

## 2018-11-18 MED ORDER — IPRATROPIUM-ALBUTEROL 0.5-2.5 (3) MG/3ML IN SOLN: 3.0000 mL | Freq: Four times a day (QID) | RESPIRATORY_TRACT | Status: DC

## 2018-11-18 MED ORDER — MOMETASONE FURO-FORMOTEROL FUM 200-5 MCG/ACT IN AERO
2.0000 | INHALATION_SPRAY | Freq: Two times a day (BID) | RESPIRATORY_TRACT | Status: DC
  Administered 2018-11-18 – 2018-11-24 (×10): 2 via RESPIRATORY_TRACT
  Filled 2018-11-18 (×2): qty 8.8

## 2018-11-18 MED ORDER — ALBUTEROL SULFATE (2.5 MG/3ML) 0.083% IN NEBU: 2.5000 mg | INHALATION_SOLUTION | RESPIRATORY_TRACT | Status: DC | PRN

## 2018-11-18 MED ORDER — NICOTINE 7 MG/24HR TD PT24
7.0000 mg | MEDICATED_PATCH | Freq: Every day | TRANSDERMAL | Status: DC
  Administered 2018-11-18 – 2018-11-24 (×5): 7 mg via TRANSDERMAL
  Filled 2018-11-18 (×8): qty 1

## 2018-11-18 MED ORDER — DILTIAZEM HCL ER COATED BEADS 120 MG PO CP24
120.0000 mg | ORAL_CAPSULE | Freq: Every day | ORAL | Status: DC
  Administered 2018-11-18: 120 mg via ORAL
  Filled 2018-11-18: qty 1

## 2018-11-18 MED ORDER — AZITHROMYCIN 500 MG PO TABS
500.0000 mg | ORAL_TABLET | Freq: Every day | ORAL | Status: AC
  Administered 2018-11-18: 500 mg via ORAL
  Filled 2018-11-18: qty 1

## 2018-11-18 MED ORDER — SODIUM CHLORIDE 0.9% FLUSH
3.0000 mL | Freq: Two times a day (BID) | INTRAVENOUS | Status: DC
  Administered 2018-11-19 – 2018-11-26 (×16): 3 mL via INTRAVENOUS

## 2018-11-18 MED ORDER — ASPIRIN EC 81 MG PO TBEC
81.0000 mg | DELAYED_RELEASE_TABLET | Freq: Every day | ORAL | Status: DC
  Administered 2018-11-18 – 2018-11-24 (×6): 81 mg via ORAL
  Filled 2018-11-18 (×7): qty 1

## 2018-11-18 MED ORDER — FUROSEMIDE 10 MG/ML IJ SOLN
40.0000 mg | Freq: Once | INTRAMUSCULAR | Status: AC
  Administered 2018-11-18: 40 mg via INTRAVENOUS
  Filled 2018-11-18: qty 4

## 2018-11-18 MED ORDER — METHYLPREDNISOLONE SODIUM SUCC 125 MG IJ SOLR
125.0000 mg | Freq: Once | INTRAMUSCULAR | Status: AC
  Administered 2018-11-18: 125 mg via INTRAVENOUS
  Filled 2018-11-18: qty 2

## 2018-11-18 MED ORDER — CARVEDILOL 3.125 MG PO TABS
3.1250 mg | ORAL_TABLET | Freq: Two times a day (BID) | ORAL | Status: DC
  Administered 2018-11-18: 3.125 mg via ORAL
  Filled 2018-11-18: qty 1

## 2018-11-18 MED ORDER — IPRATROPIUM-ALBUTEROL 0.5-2.5 (3) MG/3ML IN SOLN
3.0000 mL | Freq: Four times a day (QID) | RESPIRATORY_TRACT | Status: DC
  Administered 2018-11-18 – 2018-11-19 (×3): 3 mL via RESPIRATORY_TRACT
  Filled 2018-11-18 (×3): qty 3

## 2018-11-18 MED ORDER — ENOXAPARIN SODIUM 40 MG/0.4ML ~~LOC~~ SOLN
40.0000 mg | Freq: Every day | SUBCUTANEOUS | Status: DC
  Administered 2018-11-18 – 2018-11-26 (×9): 40 mg via SUBCUTANEOUS
  Filled 2018-11-18 (×9): qty 0.4

## 2018-11-18 NOTE — H&P (Signed)
[facility] NAME: Jason Bryan    MR#:  [id number]  DATE OF BIRTH:  Oct 28, 1955  DATE OF ADMISSION:  11/18/2018  PRIMARY CARE PHYSICIAN: Cletis Athens, MD   REQUESTING/REFERRING PHYSICIAN: Blake Divine, MD  CHIEF COMPLAINT:   Shortness of breath and leg swelling HISTORY OF PRESENT ILLNESS:  Jason Bryan  is a 63 y.o. 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  Patient admits 1 week history of worsening of lower extremity swelling associated with worsening of the shortness of breath with exertion.  Reports a productive cough and started smoking 3 to 4 cigarettes/day.  Wife at bedside.  Patient sees his primary care physician Dr. Rebecka Apley.  Chest x-ray with pulmonary edema and consolidation  PAST MEDICAL HISTORY:   Past Medical History:  Diagnosis Date  . Cancer ([place]) 2011   squemous stage 3, head and neck Ca with chemo + rad tx's.   . Lung cancer, upper lobe ([place]) 04/11/2015  . Squamous cell lung cancer ([place]) 2015   Right Lung CA with chemo + rad tx's.    PAST SURGICAL HISTOIRY:  History reviewed. No pertinent surgical history.  SOCIAL HISTORY:   Social History   Tobacco Use  . Smoking status: Former Smoker    Packs/day: 1.00    Years: 40.00    Pack years: 40.00    Types: Cigarettes  . Smokeless tobacco: Never Used  Substance Use Topics  . Alcohol use: Yes    Alcohol/week: 7.0 standard drinks    Types: 7 Cans of beer per week    Comment: 1 can beer a day    FAMILY HISTORY:   Family History  Problem Relation Age of Onset  . Cancer Mother     DRUG ALLERGIES:  No Known Allergies  REVIEW OF SYSTEMS:  CONSTITUTIONAL: No fever, fatigue or weakness.  EYES: No blurred or double vision.   EARS, NOSE, AND THROAT: No tinnitus or ear pain.  RESPIRATORY: Reports cough, shortness of breath, wheezing, denies  hemoptysis.  CARDIOVASCULAR: No chest pain, orthopnea, edema.  GASTROINTESTINAL: No nausea, vomiting, diarrhea or abdominal pain.  GENITOURINARY: No dysuria, hematuria.  ENDOCRINE: No polyuria, nocturia,  HEMATOLOGY: No anemia, easy bruising or bleeding SKIN: No rash or lesion. MUSCULOSKELETAL: No joint pain or arthritis.  Reporting pedal edema NEUROLOGIC: No tingling, numbness, weakness.  PSYCHIATRY: No anxiety or depression.   MEDICATIONS AT HOME:   Prior to Admission medications   Medication Sig Start Date End Date Taking? Authorizing Provider  albuterol (VENTOLIN HFA) 108 (90 Base) MCG/ACT inhaler  09/07/18  Yes [provider]  budesonide-formoterol (SYMBICORT) 160-4.5 MCG/ACT inhaler Inhale 1 puff into the lungs daily. 02/01/16  Yes Lequita Asal, MD  temazepam (RESTORIL) 30 MG capsule  03/06/16  Yes [provider]  traZODone (DESYREL) 100 MG tablet Take 100 mg by mouth at bedtime. 11/17/18  Yes [provider]  CARTIA XT 120 MG 24 hr capsule  10/11/16   [provider]  furosemide (LASIX) 20 MG tablet  08/29/16   [provider]  lisinopril (PRINIVIL,ZESTRIL) 10 MG tablet  09/28/16   [provider]      VITAL SIGNS:  Blood pressure 116/85, pulse (!) 106, temperature 98.2 F (36.8 C), temperature  source Oral, resp. rate (!) 24, height 5\' 6"  (1.676 m), weight 80.7 kg, SpO2 96 %.  PHYSICAL EXAMINATION:  GENERAL:  63 y.o.-year-old patient lying in the bed with no acute distress.  EYES: Pupils equal, round, reactive to light and accommodation. No scleral icterus. Extraocular muscles intact.  HEENT: Head atraumatic, normocephalic. Oropharynx and nasopharynx clear.  NECK:  Supple, no jugular venous distention. No thyroid enlargement, no tenderness.  LUNGS: Diminished breath sounds bilaterally, diffuse  wheezing, rales,rhonchi, no crepitation. No use of accessory muscles of respiration.  CARDIOVASCULAR: S1, S2 normal. No murmurs, rubs, or gallops.  ABDOMEN: Soft, nontender, nondistended. Bowel sounds present.  EXTREMITIES: No pedal edema, cyanosis, or clubbing.  NEUROLOGIC: Cranial nerves II through XII are intact. Muscle strength weak in all extremities. Sensation intact. Gait not checked.  PSYCHIATRIC: The patient is alert and oriented x 3.  SKIN: No obvious rash, lesion, or ulcer.   LABORATORY PANEL:   CBC Recent Labs  Lab 11/18/18 1200  WBC 8.7  HGB 16.1  HCT 48.4  PLT 240   ------------------------------------------------------------------------------------------------------------------  Chemistries  Recent Labs  Lab 11/18/18 1200  NA 139  K 4.8  CL 96*  CO2 32  GLUCOSE 106*  BUN 42*  CREATININE 1.17  CALCIUM 9.1   ------------------------------------------------------------------------------------------------------------------  Cardiac Enzymes No results for input(s): TROPONINI in the last 168 hours. ------------------------------------------------------------------------------------------------------------------  RADIOLOGY:  Dg Chest Portable 1 View  Result Date: 11/18/2018 CLINICAL DATA:  Shortness of breath, ankle swelling and progressive shortness of breath, history of upper lobe lung cancer EXAM: PORTABLE CHEST 1 VIEW COMPARISON:  08/07/2018, CT chest, 06/09/2018 FINDINGS: There is a new, moderate right pleural effusion with associated atelectasis or consolidation. There is mild, diffuse interstitial opacity elsewhere throughout. There are nodules of the bilateral upper lobes, not significantly changed compared to prior examination. Cardiomegaly. IMPRESSION: 1. There is a new, moderate right pleural effusion with associated atelectasis or consolidation. 2. There is mild, diffuse interstitial opacity elsewhere throughout, consistent with edema. 3. There are  nodules of the bilateral upper lobes, not significantly changed compared to prior examination and in keeping with known lung malignancy. Electronically Signed   By: Eddie Candle M.D.   On: 11/18/2018 11:50    EKG:   Orders placed or performed during the hospital encounter of 11/18/18  . EKG 12-Lead  . EKG 12-Lead    IMPRESSION AND PLAN:    #Acute hypoxic respiratory failure from acute CHF and COPD with the underlying lung cancer Admit to telemetry Continue oxygen via nasal cannula and wean off as tolerated IV Lasix and IV steroids  #Acute CHF IV Lasix, continue home dose ACE inhibitor and small dose beta-blocker and baby aspirin added to the regimen Daily weight monitoring, intake and output Echocardiogram Cardiology consult to Dr. Rockey Situ  #Acute COPD Patient continues to smoke counseled to quit smoking IV steroids, bronchodilator treatment and azithromycin  #Insomnia Trazodone  #Lung cancer Patient sees Dr. Donella Stade for radiation therapy.  Continue follow-up with him  #Tobacco abuse disorder Counseled patient to quit smoking for 5 minutes.  He verbalized understanding.  Will provide him nicotine patch   All the records are reviewed and case discussed with ED provider. Management plans discussed with the patient, family and they are in agreement.  CODE STATUS: FC . WIFE HCPOA  TOTAL TIME TAKING CARE OF THIS PATIENT: 45 minutes.   Note: This dictation was prepared with Dragon dictation along with smaller phrase technology. Any transcriptional errors that result from this process are unintentional.  Nicholes Mango M.D on 11/18/2018 at 2:57 PM  Between 7am to 6pm - Pager - [phone]  After 6pm go to [email or web address] - password [email or web address] [facility]  Office  [phone]  CC: Primary care physician; Cletis Athens, MD

## 2018-11-18 NOTE — ED Notes (Signed)
ED TO INPATIENT HANDOFF REPORT  ED Nurse Name and Phone #: Langley Gauss [phone]  S Name/Age/Gender Jason Bryan 63 y.o. male Room/Bed: ED09A/ED09A  Code Status   Code Status: Not on file  Home/SNF/Other Home Patient oriented to: self, place, time and situation Is this baseline? Yes   Triage Complete: Triage complete  Chief Complaint swelling to ankles  Triage Note Reports bilateral ankle swelling X 3 days and progressive SOB over last 3 days. Reports SOB at baseline but fluctuates in severity. Pt has audible wheezing. Obvious SOB. Color WNL.   Allergies No Known Allergies  Level of Care/Admitting Diagnosis ED Disposition    ED Disposition Condition [facility]: [facility] [[id number]]  Level of Care: Telemetry [5]  Covid Evaluation: Confirmed COVID Negative  Diagnosis: Acute CHF (congestive heart failure) ([place]) [643329]  Admitting Physician: Nicholes Mango [[id number]]  Attending Physician: Nicholes Mango [[id number]]  Estimated length of stay: 3 - 4 days  Certification:: I certify this patient will need inpatient services for at least 2 midnights  Bed request comments: 2a  PT Class (Do Not Modify): Inpatient [101]  PT Acc Code (Do Not Modify): Private [1]       B Medical/Surgery History Past Medical History:  Diagnosis Date  . Cancer ([facility]) 2011   squemous stage 3, head and neck Ca with chemo + rad tx's.   . Lung cancer, upper lobe ([place]) 04/11/2015  . Squamous cell lung cancer ([place]) 2015   Right Lung CA with chemo + rad tx's.   History reviewed. No pertinent surgical history.   A IV Location/Drains/Wounds Patient Lines/Drains/Airways Status   Active Line/Drains/Airways    Name:   Placement date:   Placement time:   Site:   Days:   Implanted Port Left Chest   -    -    Chest             Intake/Output Last 24 hours  Intake/Output Summary (Last 24 hours) at 11/18/2018 1535 Last data filed at 11/18/2018 1534 Gross per 24 hour  Intake  -  Output 400 ml  Net -400 ml    Labs/Imaging Results for orders placed or performed during the hospital encounter of 11/18/18 (from the past 48 hour(s))  Brain natriuretic peptide     Status: Abnormal   Collection Time: 11/18/18 11:55 AM  Result Value Ref Range   B Natriuretic Peptide 3,146.0 (H) 0.0 - 100.0 pg/mL    Comment: Performed at [facility], [address] [place], [place] [address]  CBC with Differential     Status: Abnormal   Collection Time: 11/18/18 12:00 PM  Result Value Ref Range   WBC 8.7 4.0 - 10.5 K/uL   RBC 5.67 4.22 - 5.81 MIL/uL   Hemoglobin 16.1 13.0 - 17.0 g/dL   HCT 48.4 39.0 - 52.0 %   MCV 85.4 80.0 - 100.0 fL   MCH 28.4 26.0 - 34.0 pg   MCHC 33.3 30.0 - 36.0 g/dL   RDW 17.1 (H) 11.5 - 15.5 %   Platelets 240 150 - 400 K/uL   nRBC 0.0 0.0 - 0.2 %   Neutrophils Relative % 83 %   Neutro Abs 7.2 1.7 - 7.7 K/uL   Lymphocytes Relative 7 %   Lymphs Abs 0.6 (L) 0.7 - 4.0 K/uL   Monocytes Relative 8 %   Monocytes Absolute 0.7 0.1 - 1.0 K/uL   Eosinophils Relative 1 %   Eosinophils Absolute 0.1 0.0 -  0.5 K/uL   Basophils Relative 0 %   Basophils Absolute 0.0 0.0 - 0.1 K/uL   Immature Granulocytes 1 %   Abs Immature Granulocytes 0.06 0.00 - 0.07 K/uL    Comment: Performed at [facility], [place], [place] [address]  Troponin I (High Sensitivity)     Status: Abnormal   Collection Time: 11/18/18 12:00 PM  Result Value Ref Range   Troponin I (High Sensitivity) 45 (H) <18 ng/L    Comment: (NOTE) Elevated high sensitivity troponin I (hsTnI) values and significant  changes across serial measurements may suggest ACS but many other  chronic and acute conditions are known to elevate hsTnI results.  Refer to the "Links" section for chest pain algorithms and additional  guidance. Performed at [facility], [address] [place], [place] [address]   Basic metabolic panel     Status: Abnormal   Collection Time:  11/18/18 12:00 PM  Result Value Ref Range   Sodium 139 135 - 145 mmol/L   Potassium 4.8 3.5 - 5.1 mmol/L   Chloride 96 (L) 98 - 111 mmol/L   CO2 32 22 - 32 mmol/L   Glucose, Bld 106 (H) 70 - 99 mg/dL   BUN 42 (H) 8 - 23 mg/dL   Creatinine, Ser 1.17 0.61 - 1.24 mg/dL   Calcium 9.1 8.9 - 10.3 mg/dL   GFR calc non Af Amer >60 >60 mL/min   GFR calc Af Amer >60 >60 mL/min   Anion gap 11 5 - 15    Comment: Performed at [facility], [address] [place], [place] [address]  SARS Coronavirus 2 [facility] order, Performed in [facility] hospital lab) Nasopharyngeal Nasopharyngeal Swab     Status: None   Collection Time: 11/18/18 12:00 PM   Specimen: Nasopharyngeal Swab  Result Value Ref Range   SARS Coronavirus 2 NEGATIVE NEGATIVE    Comment: (NOTE) If result is NEGATIVE SARS-CoV-2 target nucleic acids are NOT DETECTED. The SARS-CoV-2 RNA is generally detectable in upper and lower  respiratory specimens during the acute phase of infection. The lowest  concentration of SARS-CoV-2 viral copies this assay can detect is 250  copies / mL. A negative result does not preclude SARS-CoV-2 infection  and should not be used as the sole basis for treatment or other  patient management decisions.  A negative result may occur with  improper specimen collection / handling, submission of specimen other  than nasopharyngeal swab, presence of viral mutation(s) within the  areas targeted by this assay, and inadequate number of viral copies  (<250 copies / mL). A negative result must be combined with clinical  observations, patient history, and epidemiological information. If result is POSITIVE SARS-CoV-2 target nucleic acids are DETECTED. The SARS-CoV-2 RNA is generally detectable in upper and lower  respiratory specimens dur ing the acute phase of infection.  Positive  results are indicative of active infection with SARS-CoV-2.  Clinical  correlation with patient history and other diagnostic  information is  necessary to determine patient infection status.  Positive results do  not rule out bacterial infection or co-infection with other viruses. If result is PRESUMPTIVE POSTIVE SARS-CoV-2 nucleic acids MAY BE PRESENT.   A presumptive positive result was obtained on the submitted specimen  and confirmed on repeat testing.  While 2019 novel coronavirus  (SARS-CoV-2) nucleic acids may be present in the submitted sample  additional confirmatory testing may be necessary for epidemiological  and / or clinical management purposes  to differentiate between  SARS-CoV-2 and other Sarbecovirus currently known to infect humans.  If clinically indicated additional testing with an alternate test  methodology [id number]) is advised. The SARS-CoV-2 RNA is generally  detectable in upper and lower respiratory sp ecimens during the acute  phase of infection. The expected result is Negative. Fact Sheet for Patients:  StrictlyIdeas.no Fact Sheet for Healthcare Providers: BankingDealers.co.za This test is not yet approved or cleared by the [place] FDA and has been authorized for detection and/or diagnosis of SARS-CoV-2 by FDA under an Emergency Use Authorization (EUA).  This EUA will remain in effect (meaning this test can be used) for the duration of the COVID-19 declaration under Section 564(b)(1) of the Act, 21 U.S.C. section 360bbb-3(b)(1), unless the authorization is terminated or revoked sooner. Performed at [facility], [address] [place], [place] [address]    Dg Chest Portable 1 View  Result Date: 11/18/2018 CLINICAL DATA:  Shortness of breath, ankle swelling and progressive shortness of breath, history of upper lobe lung cancer EXAM: PORTABLE CHEST 1 VIEW COMPARISON:  08/07/2018, CT chest, 06/09/2018 FINDINGS: There is a new, moderate right pleural effusion with associated atelectasis or consolidation. There is mild,  diffuse interstitial opacity elsewhere throughout. There are nodules of the bilateral upper lobes, not significantly changed compared to prior examination. Cardiomegaly. IMPRESSION: 1. There is a new, moderate right pleural effusion with associated atelectasis or consolidation. 2. There is mild, diffuse interstitial opacity elsewhere throughout, consistent with edema. 3. There are nodules of the bilateral upper lobes, not significantly changed compared to prior examination and in keeping with known lung malignancy. Electronically Signed   By: Eddie Candle M.D.   On: 11/18/2018 11:50    Pending Labs [employer] (From admission, onward)    Start     Ordered   Signed and Held  Basic metabolic panel  Daily,   R     Signed and Held          Vitals/Pain Today's Vitals   11/18/18 1122 11/18/18 1200 11/18/18 1233 11/18/18 1234  BP:  116/85    Pulse:  (!) 106    Resp:  (!) 24    Temp:      TempSrc:      SpO2:  97% (!) 84% 96%  Weight:      Height:      PainSc: 0-No pain       Isolation Precautions Airborne and Contact precautions  Medications Medications  ipratropium-albuterol (DUONEB) 0.5-2.5 (3) MG/3ML nebulizer solution 3 mL (3 mLs Nebulization Given 11/18/18 1157)  methylPREDNISolone sodium succinate (SOLU-MEDROL) 125 mg/2 mL injection 60 mg (has no administration in time range)  furosemide (LASIX) injection 40 mg (has no administration in time range)  albuterol (PROVENTIL) (2.5 MG/3ML) 0.083% nebulizer solution 2.5 mg (has no administration in time range)  azithromycin (ZITHROMAX) tablet 500 mg (500 mg Oral Given 11/18/18 1534)    Followed by  azithromycin (ZITHROMAX) tablet 250 mg (has no administration in time range)  nicotine (NICODERM CQ - dosed in mg/24 hr) patch 7 mg (has no administration in time range)  ipratropium-albuterol (DUONEB) 0.5-2.5 (3) MG/3ML nebulizer solution 3 mL (has no administration in time range)  methylPREDNISolone sodium succinate (SOLU-MEDROL) 125 mg/2  mL injection 125 mg (125 mg Intravenous Given 11/18/18 1157)  furosemide (LASIX) injection 40 mg (40 mg Intravenous Given 11/18/18 1248)    Mobility walks Low fall risk   Focused Assessments Cardiac Assessment Handoff:  Cardiac Rhythm: Sinus tachycardia Lab Results  Component Value Date   TROPONINI < 0.02 11/21/2013   No results found for: DDIMER Does the Patient currently have chest pain? No  , Pulmonary Assessment Handoff:  Lung sounds: Bilateral Breath Sounds: Inspiratory wheezes, Expiratory wheezes L Breath Sounds: Expiratory wheezes, Inspiratory wheezes R Breath Sounds: Inspiratory wheezes, Expiratory wheezes O2 Device: Nasal Cannula O2 Flow Rate (L/min): 2 L/min      R Recommendations: See Admitting Provider Note  Report given to:   Additional Notes:

## 2018-11-18 NOTE — ED Notes (Signed)
Pt desat to 84% with good waveform - pt placed on 2L O2 via n/c - Dr Charna Archer notified

## 2018-11-18 NOTE — ED Triage Notes (Signed)
Reports bilateral ankle swelling X 3 days and progressive SOB over last 3 days. Reports SOB at baseline but fluctuates in severity. Pt has audible wheezing. Obvious SOB. Color WNL.

## 2018-11-18 NOTE — Progress Notes (Signed)
Family Meeting Note  Advance Directive:yes  Today a meeting took place with the Patient, wife at bedside  The following clinical team members were present during this meeting:MD  The following were discussed:Patient's diagnosis: Acute hypoxemic respiratory failure, COPD exacerbation, CHF exacerbation, tobacco abuse, history of lung cancer on radiation therapy, is admitted to the hospital.  Treatment plan of care discussed in detail with the patient and his wife at bedside.  They both verbalized understanding of the plan   patient's progosis: Unable to determine and Goals for treatment: Full Code  Wife Jason Bryan is the healthcare power of attorney  Additional follow-up to be provided: Hospitalist, cardiology  Time spent during discussion:17 min   Nicholes Mango, MD

## 2018-11-18 NOTE — Consult Note (Addendum)
Cardiology Consultation:   Patient ID: Jason Bryan MRN: [id number]; DOB: 02-20-1956  Admit date: 11/18/2018 Date of Consult: 11/18/2018  Primary Care Provider: Cletis Athens, MD Primary Cardiologist: New to [facility] Physician requesting consult: Dr. Margaretmary Eddy Reason for consult: Shortness of breath, lower extremity edema   Patient Profile:   Jason Bryan is a 63 y.o. 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  Denies any significant chest pain He has been having some occasional indigestion and abdominal swelling but typically this is resolved by belching.  Again continues to smoke less than 1/2 pack/day He has noticed some more audible wheezing Denies significant sputum production  In the emergency room noted to have BNP of 3000 He received Lasix, duo nebs, steroids Reports his symptoms are starting to improve though still with significant leg edema.  Full urinal next to his bed in the emergency room  He reports that he is scheduled for PET scan Previous PET scan April 2021.7 cm right upper lobe pulmonary nodule consistent with neoplasm. 2.6 x 3.4 cm irregular opacity posterior left upper lobe which is slowly progressive (SUV 3.4) compared to previous PET-CT of 12/04/2017.  Final treatment for lung cancer- radiation therapy was in May 2020  Chest x-ray this admission There is a new, moderate right pleural effusion with associated atelectasis or consolidation  CT scan June 09, 2018 Very mild coronary calcification Mild aortic atherosclerosis   Heart Pathway Score:     Past Medical History:  Diagnosis Date  . Cancer ([place]) 2011    squemous stage 3, head and neck Ca with chemo + rad tx's.   . Lung cancer, upper lobe ([place]) 04/11/2015  . Squamous cell lung cancer ([place]) 2015   Right Lung CA with chemo + rad tx's.    History reviewed. No pertinent surgical history.   Home Medications:  Prior to Admission medications   Medication Sig Start Date End Date Taking? Authorizing Provider  albuterol (VENTOLIN HFA) 108 (90 Base) MCG/ACT inhaler  09/07/18  Yes [provider]  budesonide-formoterol (SYMBICORT) 160-4.5 MCG/ACT inhaler Inhale 1 puff into the lungs daily. 02/01/16  Yes Lequita Asal, MD  temazepam (RESTORIL) 30 MG capsule  03/06/16  Yes [provider]  traZODone (DESYREL) 100 MG tablet Take 100 mg by mouth at bedtime. 11/17/18  Yes [provider]  CARTIA XT 120 MG 24 hr capsule  10/11/16   [provider]  furosemide (LASIX) 20 MG tablet  08/29/16   [provider]  lisinopril (PRINIVIL,ZESTRIL) 10 MG tablet  09/28/16   [provider]    Inpatient Medications: Scheduled Meds: . aspirin EC  81 mg Oral Daily  . [START ON 11/19/2018] azithromycin  250 mg Oral Daily  . carvedilol  3.125 mg Oral BID WC  . diltiazem  120 mg Oral Daily  . furosemide  40 mg Intravenous Q12H  . ipratropium-albuterol  3 mL Nebulization Q6H  . lisinopril  10 mg Oral Daily  . methylPREDNISolone (SOLU-MEDROL) injection  60 mg Intravenous Q8H  . mometasone-formoterol  2 puff Inhalation BID  . nicotine  7 mg Transdermal Daily  . temazepam  30 mg Oral QHS  Continuous Infusions:  PRN Meds: albuterol, ipratropium-albuterol  Allergies:   No Known Allergies  Social History:   Social History   Socioeconomic History  . Marital status: Married    Spouse name: Not on file  . Number of children: Not on file  . Years of education: Not on file  . Highest education level: Not on file  Occupational History  . Not on file  Social Needs  . Financial resource strain: Not on file  .  Food insecurity    Worry: Not on file    Inability: Not on file  . Transportation needs    Medical: Not on file    Non-medical: Not on file  Tobacco Use  . Smoking status: Former Smoker    Packs/day: 1.00    Years: 40.00    Pack years: 40.00    Types: Cigarettes  . Smokeless tobacco: Never Used  Substance and Sexual Activity  . Alcohol use: Yes    Alcohol/week: 7.0 standard drinks    Types: 7 Cans of beer per week    Comment: 1 can beer a day  . Drug use: No  . Sexual activity: Not on file  Lifestyle  . Physical activity    Days per week: Not on file    Minutes per session: Not on file  . Stress: Not on file  Relationships  . Social Herbalist on phone: Not on file    Gets together: Not on file    Attends religious service: Not on file    Active member of club or organization: Not on file    Attends meetings of clubs or organizations: Not on file    Relationship status: Not on file  . Intimate partner violence    Fear of current or ex partner: Not on file    Emotionally abused: Not on file    Physically abused: Not on file    Forced sexual activity: Not on file  Other Topics Concern  . Not on file  Social History Narrative  . Not on file    Family History:    Family History  Problem Relation Age of Onset  . Cancer Mother     ROS:  Please see the history of present illness.  Review of Systems  Constitutional: Negative.   HENT: Negative.   Respiratory: Positive for shortness of breath.   Cardiovascular: Positive for leg swelling.  Gastrointestinal: Negative.   Musculoskeletal: Negative.   Neurological: Negative.   Psychiatric/Behavioral: Negative.   All other systems reviewed and are negative.   Physical Exam/Data:   Vitals:   11/18/18 1234 11/18/18 1600 11/18/18 1630 11/18/18 1730  BP:  119/73 120/73 113/85  Pulse:  (!) 101 (!) 101 (!) 105  Resp:    20  Temp:    (!) 97.5 F (36.4 C)  TempSrc:    Oral  SpO2: 96% 91% 93% 98%  Weight:       Height:        Intake/Output Summary (Last 24 hours) at 11/18/2018 1800 Last data filed at 11/18/2018 1534 Gross per 24 hour  Intake -  Output 400 ml  Net -400 ml   Last 3 Weights 11/18/2018 09/09/2018 06/29/2018  Weight (lbs) 178 lb 180 lb 3.6 oz 178 lb 2.1 oz  Weight (kg) 80.74 kg 81.75 kg 80.8 kg     Body mass index is 28.73 kg/m.  General:  Well nourished, well developed, in no acute distress HEENT: normal Lymph: no adenopathy  Neck: no JVD Endocrine:  No thryomegaly Vascular: No carotid bruits; FA pulses 2+ bilaterally without bruits  Cardiac:  normal S1, S2; RRR; no murmur  2+ pitting lower extremity edema bilaterally to below the knees Lungs: Scattered wheezing, dull at the right base halfway up Abd: soft, nontender, no hepatomegaly  Ext: no edema Musculoskeletal:  No deformities, BUE and BLE strength normal and equal Skin: warm and dry  Neuro:  CNs 2-12 intact, no focal abnormalities noted Psych:  Normal affect   EKG:  The EKG was personally reviewed and demonstrates:   Normal sinus rhythm with rate 107 bpm T wave abnormality V5, V6, 1 and aVL concerning for anterolateral ischemia  Telemetry:  Telemetry was personally reviewed and demonstrates: Normal sinus rhythm  Relevant CV Studies: Echocardiogram pending  Laboratory Data:  High Sensitivity Troponin:   Recent Labs  Lab 11/18/18 1200  TROPONINIHS 45*     Chemistry Recent Labs  Lab 11/18/18 1200  NA 139  K 4.8  CL 96*  CO2 32  GLUCOSE 106*  BUN 42*  CREATININE 1.17  CALCIUM 9.1  GFRNONAA >60  GFRAA >60  ANIONGAP 11    No results for input(s): PROT, ALBUMIN, AST, ALT, ALKPHOS, BILITOT in the last 168 hours. Hematology Recent Labs  Lab 11/18/18 1200  WBC 8.7  RBC 5.67  HGB 16.1  HCT 48.4  MCV 85.4  MCH 28.4  MCHC 33.3  RDW 17.1*  PLT 240   BNP Recent Labs  Lab 11/18/18 1155  BNP 3,146.0*    DDimer No results for input(s): DDIMER in the last 168 hours.   Radiology/Studies:  Dg  Chest Portable 1 View  Result Date: 11/18/2018 CLINICAL DATA:  Shortness of breath, ankle swelling and progressive shortness of breath, history of upper lobe lung cancer EXAM: PORTABLE CHEST 1 VIEW COMPARISON:  08/07/2018, CT chest, 06/09/2018 FINDINGS: There is a new, moderate right pleural effusion with associated atelectasis or consolidation. There is mild, diffuse interstitial opacity elsewhere throughout. There are nodules of the bilateral upper lobes, not significantly changed compared to prior examination. Cardiomegaly. IMPRESSION: 1. There is a new, moderate right pleural effusion with associated atelectasis or consolidation. 2. There is mild, diffuse interstitial opacity elsewhere throughout, consistent with edema. 3. There are nodules of the bilateral upper lobes, not significantly changed compared to prior examination and in keeping with known lung malignancy. Electronically Signed   By: Eddie Candle M.D.   On: 11/18/2018 11:50    Assessment and Plan:   1.  Shortness of breath/lower extremity edema Presumed to be acute diastolic CHF Pleural effusion on x-ray moderate size on the right Echocardiogram pending Previously he denies excessive fluid or salt intake -Certainly possible that lower extremity edema is exacerbated by calcium channel blocker, though it appears he has been on this medication since 2018 -Would continue Lasix IV twice daily for now with close monitoring of renal function -Would likely benefit from CHF education.  Has been drinking Gatorade recently  2.  COPD Likely component of bronchospasm given his wheezing Agree with nebulizers, steroids Continues to smoke Smoking cessation recommended  3.  Pleural effusion Etiology unclear, possibly related to CHF symptoms as above, elevated BNP over 3000 -Given his underlying COPD, he may benefit from thoracentesis if large enough  4.  Demand ischemia So far minimally elevated high-sensitivity troponin Repeat pending No  symptoms concerning for unstable angina Prior CT scan earlier this year with minimal coronary calcification likely placing him at low risk of underlying ischemia  5.  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

## 2018-11-18 NOTE — ED Notes (Signed)
Pt taken to ER room, noted to have oxygen saturation 82% after movement from wheelchair to bed. Placed on 2L [place] and oxygen 85%, upped oxygen to 4L [place] 98%. EDP and primary RN Langley Gauss aware. Pt placed on cardiac monitor.

## 2018-11-18 NOTE — ED Notes (Signed)
Attempted to call report x1, floor unable to accept patient at this time.

## 2018-11-18 NOTE — ED Notes (Signed)
Floor still available for report. Patient is stable at this time, sent to unit with RN escort.

## 2018-11-18 NOTE — ED Provider Notes (Signed)
[facility] Emergency Department Provider Note   ____________________________________________   First MD Initiated Contact with Patient 11/18/18 1115     (approximate)  I have reviewed the triage vital signs and the nursing notes.   HISTORY  Chief Complaint Leg Swelling and Shortness of Breath    HPI Jason Bryan is a 63 y.o. male with past medical history of lung cancer and COPD presents to the ED complaining of leg swelling and shortness of breath.  Patient reports he has had about 1 week of gradually worsening swelling in his lower extremities along with dyspnea on exertion.  He states he has been coughing somewhat, but denies any fevers or chest pain.  He admits to history of COPD and continues to smoke, approximately 3 to 4 cigarettes/day.  He denies any cardiac history or history of CHF.        Past Medical History:  Diagnosis Date  . Cancer ([place]) 2011   squemous stage 3, head and neck Ca with chemo + rad tx's.   . Lung cancer, upper lobe ([place]) 04/11/2015  . Squamous cell lung cancer ([place]) 2015   Right Lung CA with chemo + rad tx's.    Patient Active Problem List   Diagnosis Date Noted  . Goals of care, counseling/discussion 06/19/2018  . Weight loss 12/06/2017  . Insomnia 11/16/2015  . Lung cancer, upper lobe ([place]) 04/11/2015  . 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 Squamous cell carcinoma of nasopharynx ([place]) 02/15/2010    History reviewed. No pertinent surgical history.  Prior to Admission medications   Medication Sig Start Date End Date Taking? Authorizing Provider  albuterol (VENTOLIN HFA) 108 (90 Base) MCG/ACT inhaler  09/07/18   [provider]  budesonide-formoterol (SYMBICORT) 160-4.5 MCG/ACT inhaler Inhale 1 puff into the lungs daily. 02/01/16   Lequita Asal, MD  CARTIA XT 120 MG 24 hr capsule  10/11/16   [provider]  furosemide (LASIX) 20 MG  tablet  08/29/16   [provider]  lisinopril (PRINIVIL,ZESTRIL) 10 MG tablet  09/28/16   [provider]  temazepam (RESTORIL) 30 MG capsule  03/06/16   [provider]  traZODone (DESYREL) 100 MG tablet Take 100 mg by mouth at bedtime. 11/17/18   [provider]    Allergies Patient has no known allergies.  Family History  Problem Relation Age of Onset  . Cancer Mother     Social History Social History   Tobacco Use  . Smoking status: Former Smoker    Packs/day: 1.00    Years: 40.00    Pack years: 40.00    Types: Cigarettes  . Smokeless tobacco: Never Used  Substance Use Topics  . Alcohol use: Yes    Alcohol/week: 7.0 standard drinks    Types: 7 Cans of beer per week    Comment: 1 can beer a day  . Drug use: No    Review of Systems  Constitutional: No fever/chills Eyes: No visual changes. ENT: No sore throat. Cardiovascular: Denies chest pain. Respiratory: Positive for shortness of breath and cough. Gastrointestinal: No abdominal pain.  No nausea, no vomiting.  No diarrhea.  No constipation. Genitourinary: Negative for dysuria. Musculoskeletal: Negative for back pain. Skin: Negative for rash. Neurological: Negative for headaches, focal weakness or numbness.  ____________________________________________   PHYSICAL EXAM:  VITAL SIGNS: ED Triage Vitals  Enc Vitals Group     BP 11/18/18 1107 117/90  Pulse Rate 11/18/18 1107 (!) 106     Resp 11/18/18 1107 (!) 26     Temp 11/18/18 1107 98.2 F (36.8 C)     Temp Source 11/18/18 1107 Oral     SpO2 11/18/18 1107 92 %     Weight 11/18/18 1108 178 lb (80.7 kg)     Height 11/18/18 1108 5\' 6"  (1.676 m)     Head Circumference --      Peak Flow --      Pain Score 11/18/18 1108 0     Pain Loc --      Pain Edu? --      Excl. in [place]? --     Constitutional: Alert and oriented. Eyes: Conjunctivae are normal. Head: Atraumatic. Nose: No congestion/rhinnorhea. Mouth/Throat:  Mucous membranes are moist. Neck: Normal ROM Cardiovascular: Normal rate, regular rhythm. Grossly normal heart sounds. Respiratory: Normal respiratory effort.  No retractions.  Expiratory wheezes throughout with crackles at bilateral bases. Gastrointestinal: Soft and nontender. No distention. Genitourinary: deferred Musculoskeletal: 2+ pitting edema to bilateral lower extremities, no lower extremity tenderness. Neurologic:  Normal speech and language. No gross focal neurologic deficits are appreciated. Skin:  Skin is warm, dry and intact. No rash noted. Psychiatric: Mood and affect are normal. Speech and behavior are normal.  ____________________________________________   LABS (all labs ordered are listed, but only abnormal results are displayed)  Labs Reviewed  CBC WITH DIFFERENTIAL/PLATELET - Abnormal; Notable for the following components:      Result Value   RDW 17.1 (*)    Lymphs Abs 0.6 (*)    All other components within normal limits  BRAIN NATRIURETIC PEPTIDE - Abnormal; Notable for the following components:   B Natriuretic Peptide 3,146.0 (*)    All other components within normal limits  BASIC METABOLIC PANEL - Abnormal; Notable for the following components:   Chloride 96 (*)    Glucose, Bld 106 (*)    BUN 42 (*)    All other components within normal limits  TROPONIN I (HIGH SENSITIVITY) - Abnormal; Notable for the following components:   Troponin I (High Sensitivity) 45 (*)    All other components within normal limits  SARS CORONAVIRUS 2 (HOSPITAL ORDER, [facility])  TROPONIN I (HIGH SENSITIVITY)   ____________________________________________  EKG  ED ECG REPORT I, Blake Divine, the attending physician, personally viewed and interpreted this ECG.   Date: 11/18/2018  EKG Time: 11:13  Rate: 107  Rhythm: sinus tachycardia  Axis: LAD  Intervals:none  ST&T Change: Lateral TWI    PROCEDURES  Procedure(s) performed (including  Critical Care):  .Critical Care Performed by: Blake Divine, MD Authorized by: Blake Divine, MD   Critical care provider statement:    Critical care time (minutes):  45   Critical care time was exclusive of:  Separately billable procedures and treating other patients and teaching time   Critical care was necessary to treat or prevent imminent or life-threatening deterioration of the following conditions:  Respiratory failure   Critical care was time spent personally by me on the following activities:  Discussions with consultants, evaluation of patient's response to treatment, examination of patient, ordering and performing treatments and interventions, ordering and review of laboratory studies, ordering and review of radiographic studies, pulse oximetry, re-evaluation of patient's condition, obtaining history from patient or surrogate and review of old charts   I assumed direction of critical care for this patient from another provider in my specialty: no  ____________________________________________   INITIAL IMPRESSION / ASSESSMENT AND PLAN / ED COURSE       63 year old male with history of lung cancer and COPD presenting to the ED with increasing lower extremity swelling and dyspnea on exertion over about the past week.  Clinically appears consistent with CHF and patient has no apparent history of this.  Chest x-ray does show pulmonary edema and patient with significantly elevated BNP to greater than 3000.  Will give Lasix.  Additionally, patient noted to have expiratory wheezing and COPD likely contributing to his shortness of breath, will treat with duo nebs and steroids.  No findings on chest x-ray to suggest pneumonia and patient has a nonproductive cough with no fevers.  EKG does show to have lateral T wave inversions and troponin mildly elevated, but given lack of chest pain, low suspicion for ACS.  Given patient's oxygen requirement and new onset CHF, case discussed with  hospitalist, who accepts patient for admission.      ____________________________________________   FINAL CLINICAL IMPRESSION(S) / ED DIAGNOSES  Final diagnoses:  Acute systolic congestive heart failure (HCC)  COPD exacerbation (HCC)  Bilateral lower extremity edema     ED Discharge Orders    None       Note:  This document was prepared using Dragon voice recognition software and may include unintentional dictation errors.   Blake Divine, MD 11/18/18 1327

## 2018-11-19 ENCOUNTER — Encounter: Payer: Self-pay | Admitting: Cardiovascular Disease

## 2018-11-19 ENCOUNTER — Encounter
Admission: EM | Disposition: A | Discharge status: Hospice | Payer: Self-pay | Source: Home / Self Care | Attending: Internal Medicine

## 2018-11-19 DIAGNOSIS — I420 Dilated cardiomyopathy: Secondary | ICD-10-CM

## 2018-11-19 DIAGNOSIS — I5021 Acute systolic (congestive) heart failure: Secondary | ICD-10-CM

## 2018-11-19 DIAGNOSIS — J432 Centrilobular emphysema: Secondary | ICD-10-CM

## 2018-11-19 HISTORY — PX: RIGHT/LEFT HEART CATH AND CORONARY ANGIOGRAPHY: CATH118266

## 2018-11-19 LAB — BASIC METABOLIC PANEL
Anion gap: 11 (ref 5–15)
BUN: 48 mg/dL — ABNORMAL HIGH (ref 8–23)
CO2: 35 mmol/L — ABNORMAL HIGH (ref 22–32)
Calcium: 8.4 mg/dL — ABNORMAL LOW (ref 8.9–10.3)
Chloride: 94 mmol/L — ABNORMAL LOW (ref 98–111)
Creatinine, Ser: 1.3 mg/dL — ABNORMAL HIGH (ref 0.61–1.24)
GFR calc Af Amer: >60 mL/min (ref >60)
GFR calc non Af Amer: 57 mL/min — ABNORMAL LOW (ref >60)
Glucose, Bld: 128 mg/dL — ABNORMAL HIGH (ref 70–99)
Potassium: 5.2 mmol/L — ABNORMAL HIGH (ref 3.5–5.1)
Sodium: 140 mmol/L (ref 135–145)

## 2018-11-19 LAB — ECHOCARDIOGRAM COMPLETE
Height: 66 in
Weight: 2848 oz

## 2018-11-19 LAB — LIPID PANEL
Cholesterol: 75 mg/dL (ref 0–200)
HDL: 27 mg/dL — ABNORMAL LOW (ref >40)
LDL Cholesterol: 41 mg/dL (ref 0–99)
Total CHOL/HDL Ratio: 2.8 RATIO
Triglycerides: 35 mg/dL (ref <150)
VLDL: 7 mg/dL (ref 0–40)

## 2018-11-19 LAB — MRSA PCR SCREENING: MRSA by PCR: NEGATIVE

## 2018-11-19 LAB — GLUCOSE, CAPILLARY: Glucose-Capillary: 171 mg/dL — ABNORMAL HIGH (ref 70–99)

## 2018-11-19 SURGERY — RIGHT/LEFT HEART CATH AND CORONARY ANGIOGRAPHY
Anesthesia: Moderate Sedation

## 2018-11-19 MED ORDER — SODIUM CHLORIDE 0.9% FLUSH: 3.0000 mL | Freq: Two times a day (BID) | INTRAVENOUS | Status: DC

## 2018-11-19 MED ORDER — SODIUM CHLORIDE 0.9 % IV SOLN: 250.0000 mL | INTRAVENOUS | Status: DC | PRN

## 2018-11-19 MED ORDER — PATIROMER SORBITEX CALCIUM 8.4 G PO PACK
8.4000 g | PACK | Freq: Once | ORAL | Status: AC
  Administered 2018-11-19: 8.4 g via ORAL
  Filled 2018-11-19: qty 1

## 2018-11-19 MED ORDER — FENTANYL CITRATE (PF) 100 MCG/2ML IJ SOLN
INTRAMUSCULAR | Status: AC
  Filled 2018-11-19: qty 2

## 2018-11-19 MED ORDER — HEPARIN (PORCINE) IN NACL 1000-0.9 UT/500ML-% IV SOLN
INTRAVENOUS | Status: AC
  Filled 2018-11-19: qty 1000

## 2018-11-19 MED ORDER — MIDAZOLAM HCL 2 MG/2ML IJ SOLN
INTRAMUSCULAR | Status: AC
  Filled 2018-11-19: qty 2

## 2018-11-19 MED ORDER — SODIUM CHLORIDE 0.9 % IV BOLUS
500.0000 mL | Freq: Once | INTRAVENOUS | Status: AC
  Administered 2018-11-19: 500 mL via INTRAVENOUS

## 2018-11-19 MED ORDER — IPRATROPIUM-ALBUTEROL 0.5-2.5 (3) MG/3ML IN SOLN
3.0000 mL | Freq: Three times a day (TID) | RESPIRATORY_TRACT | Status: DC
  Administered 2018-11-19 – 2018-11-27 (×23): 3 mL via RESPIRATORY_TRACT
  Filled 2018-11-19 (×24): qty 3

## 2018-11-19 MED ORDER — CHLORHEXIDINE GLUCONATE CLOTH 2 % EX PADS
6.0000 | MEDICATED_PAD | Freq: Every day | CUTANEOUS | Status: DC
  Administered 2018-11-19 – 2018-11-26 (×4): 6 via TOPICAL

## 2018-11-19 MED ORDER — HEPARIN (PORCINE) IN NACL 2000-0.9 UNIT/L-% IV SOLN
INTRAVENOUS | Status: DC | PRN
  Administered 2018-11-19: 500 mL

## 2018-11-19 MED ORDER — MILRINONE LACTATE IN DEXTROSE 20-5 MG/100ML-% IV SOLN
0.1250 ug/kg/min | INTRAVENOUS | Status: DC
  Administered 2018-11-19 – 2018-11-23 (×6): 0.25 ug/kg/min via INTRAVENOUS
  Administered 2018-11-24: 0.125 ug/kg/min via INTRAVENOUS
  Filled 2018-11-19 (×10): qty 100

## 2018-11-19 MED ORDER — IOHEXOL 300 MG/ML  SOLN
INTRAMUSCULAR | Status: DC | PRN
  Administered 2018-11-19: 75 mL via INTRA_ARTERIAL

## 2018-11-19 MED ORDER — SODIUM CHLORIDE 0.9 % IV SOLN: INTRAVENOUS | Status: DC

## 2018-11-19 MED ORDER — SODIUM CHLORIDE 0.9% FLUSH: 3.0000 mL | INTRAVENOUS | Status: DC | PRN

## 2018-11-19 SURGICAL SUPPLY — 14 items
CATH INFINITI 5FR ANG PIGTAIL (CATHETERS) IMPLANT
CATH INFINITI 5FR JL4 (CATHETERS) ×3 IMPLANT
CATH INFINITI JR4 5F (CATHETERS) ×3 IMPLANT
CATH SWANZ 7F THERMO (CATHETERS) ×3 IMPLANT
DEVICE CLOSURE MYNXGRIP 5F (Vascular Products) ×3 IMPLANT
GUIDEWIRE EMER 3M J .025X150CM (WIRE) ×6 IMPLANT
KIT MANI 3VAL PERCEP (MISCELLANEOUS) ×3 IMPLANT
KIT RIGHT HEART (MISCELLANEOUS) ×3 IMPLANT
NEEDLE PERC 18GX7CM (NEEDLE) ×3 IMPLANT
NEEDLE SMART 18G ACCESS (NEEDLE) ×3 IMPLANT
PACK CARDIAC CATH (CUSTOM PROCEDURE TRAY) ×3 IMPLANT
SHEATH AVANTI 5FR X 11CM (SHEATH) ×3 IMPLANT
SHEATH AVANTI 7FRX11 (SHEATH) ×3 IMPLANT
WIRE GUIDERIGHT .035X150 (WIRE) ×3 IMPLANT

## 2018-11-19 NOTE — Progress Notes (Addendum)
° ° °  Spoke with patient and wife this evening regarding echo and cath findings.  We had a detailed discussion regarding the patient's severe acute systolic CHF in the setting of nonischemic cardiomyopathy.  In further discussion the patient now admits to a long life of heavy alcohol abuse drinking for "most of my life."  He indicates he quit drinking approximately 6 years prior.  Given the patient's cardiomyopathy with an EF of less than 20% with cath findings showing nonobstructive CAD and only mild pulmonary hypertension, in the setting of persistent hypotension with blood pressures running in the 82L systolic precluding the ability to escalate evidence-based heart failure therapy including IV diuresis with worsening renal function consistent with cardiorenal syndrome after discussing the case with Drs. [place], [place], and [facility] we have recommended the patient be transferred to the ICU.  With this, I had a lengthy discussion with both the patient and wife regarding initial management of this as well as what their long-term goals may be.  They are having a difficult time processing all of this though at this moment in time both the patient and wife want everything that can be done to be done.  With this, we will start the patient on milrinone infusion once he is transferred to the ICU with plan IV Lasix thereafter.  Given the patient's persistent hypotension in the setting of his severe cardiomyopathy we will need to start milrinone on the lower side in an effort to prevent worsening hypotension.  I did have a frank discussion with both the patient and wife regarding complications that can arise with this medication and with the eventual discontinuing of this medication including death.  They are both well aware of this and accept the risks.  Palliative care is on board and will revisit with the patient on 9/4 for further discussion of goals of care.  Recommend initiating IV Lasix starting on 9/4 as long as BP and  renal function allow.  For now, further evidence-based heart failure therapy including beta-blockade, ACE inhibitor/ARB/Entresto/spironolactone are deferred in the setting of hypotension and AKI.  If patient deteriorates further we may need to consider transfer to [facility] to the advanced heart failure team prior to the weekend.  This has been discussed with Dr. Rockey Situ who is in agreement and has been added as a Physicist, medical of this note.

## 2018-11-19 NOTE — Plan of Care (Signed)
PMT note:   Wife at bedside and would like to speak with cardiology about CHF diagnosis. Will return tomorrow for [place].   No charge note.

## 2018-11-19 NOTE — Progress Notes (Signed)
Progress Note  Patient Name: Jason Bryan Date of Encounter: 11/19/2018  Primary Cardiologist: New to [facility] - consult by [place]  Subjective   SOB slightly improved from admission. No chest pain. Lower extremity swelling persists. Preliminary report on echo with an EF of 20-25%. Documented UOP 650 mL for the admission. Weight 81.3-->81 kg. BP in the 65Y systolic. Renal function and potassium worsening this morning 1.17-->1.30 and 4.8-->5.2 respectively.   Inpatient Medications    Scheduled Meds: . aspirin EC  81 mg Oral Daily  . azithromycin  250 mg Oral Daily  . carvedilol  3.125 mg Oral BID WC  . diltiazem  120 mg Oral Daily  . enoxaparin (LOVENOX) injection  40 mg Subcutaneous QHS  . furosemide  40 mg Intravenous Q12H  . ipratropium-albuterol  3 mL Nebulization Q6H  . lisinopril  10 mg Oral Daily  . methylPREDNISolone (SOLU-MEDROL) injection  60 mg Intravenous Q8H  . mometasone-formoterol  2 puff Inhalation BID  . nicotine  7 mg Transdermal Daily  . sodium chloride flush  3 mL Intravenous Q12H  . temazepam  30 mg Oral QHS  . traZODone  100 mg Oral QHS   Continuous Infusions:  PRN Meds: albuterol, ipratropium-albuterol   Vital Signs    Vitals:   11/18/18 1938 11/18/18 1957 11/19/18 0229 11/19/18 0407  BP: 90/64   92/62  Pulse: 93   68  Resp: 18   18  Temp: 98 F (36.7 C)   (!) 97.5 F (36.4 C)  TempSrc:    Oral  SpO2: 96% 97% 92% 95%  Weight:    81 kg  Height:    5\' 6"  (1.676 m)    Intake/Output Summary (Last 24 hours) at 11/19/2018 0726 Last data filed at 11/18/2018 1854 Gross per 24 hour  Intake -  Output 650 ml  Net -650 ml   Filed Weights   11/18/18 1108 11/18/18 1852 11/19/18 0407  Weight: 80.7 kg 81.3 kg 81 kg    Telemetry    SR - Personally Reviewed  ECG    n/a - Personally Reviewed  Physical Exam   GEN: No acute distress.   Neck: JVD elevated to the angle of the mandible. Cardiac: RRR, no murmurs, rubs, or gallops.  Respiratory:  Poor inspiratory effort with diminished breath sounds bilaterally and crackles along the bilateral bases.  GI: Soft, nontender, non-distended.   MS: Trace bilateral pretibial edema; No deformity. Neuro:  Alert and oriented x 3; Nonfocal.  Psych: Normal affect.  Labs    Chemistry Recent Labs  Lab 11/18/18 1200 11/19/18 0519  NA 139 140  K 4.8 5.2*  CL 96* 94*  CO2 32 35*  GLUCOSE 106* 128*  BUN 42* 48*  CREATININE 1.17 1.30*  CALCIUM 9.1 8.4*  GFRNONAA >60 57*  GFRAA >60 >60  ANIONGAP 11 11     Hematology Recent Labs  Lab 11/18/18 1200  WBC 8.7  RBC 5.67  HGB 16.1  HCT 48.4  MCV 85.4  MCH 28.4  MCHC 33.3  RDW 17.1*  PLT 240    Cardiac EnzymesNo results for input(s): TROPONINI in the last 168 hours. No results for input(s): TROPIPOC in the last 168 hours.   BNP Recent Labs  Lab 11/18/18 1155  BNP 3,146.0*     DDimer No results for input(s): DDIMER in the last 168 hours.   Radiology    Dg Chest Portable 1 View  Result Date: 11/18/2018 IMPRESSION: 1. There is a new, moderate  right pleural effusion with associated atelectasis or consolidation. 2. There is mild, diffuse interstitial opacity elsewhere throughout, consistent with edema. 3. There are nodules of the bilateral upper lobes, not significantly changed compared to prior examination and in keeping with known lung malignancy. Electronically Signed   By: Eddie Candle M.D.   On: 11/18/2018 11:50    Cardiac Studies   2D Echo 11/18/2018: -Formal report pending, preliminary report with EF 20-25%   Patient Profile     63 y.o. male with history of squamous cell carcinoma of the nasopharynx, lung carcinoma, and COPD admitted with acute systolic CHF.   Assessment & Plan    1. Acute systolic CHF/cardiorenal syndrome: -He remains volume up -Preliminary report on echo with an EF of 20-25% -Renal function worsening -IV Lasix held this morning in the setting of relative hypotension and worsening renal  function -Schedule R/LHC this morning -NPO -Following R/LHC, may need augmentation of diuresis with milrinone gtt given worsening renal function with initial attempt of diuresis in the setting of his cardiomyopathy  -Given his cardiomyopathy, worsening renal function, and hypotension stop Cardizem without plans to restart and hold lisinopril for now with plans to transition to ARB/Entresto when resumed as BP and renal function allow -Prior to discharge look to add spironolactone as BP and renal function/potassium allow -Continue low dose Coreg 3.125 mg bid with hold parameters, given his cardiomyopathy, I will not escalate this at this time -Risks and benefits of cardiac catheterization have been discussed with the patient including risks of bleeding, bruising, infection, kidney damage, stroke, heart attack, and death. The patient understands these risks and is willing to proceed with the procedure. All questions have been answered and concerns listened to  2. AKI: -Likely in the setting of cardiorenal syndrome -As above  3. Demand ischemia: -Minimally elevated high sensitivity troponin not consistent with ACS -Likely demand ischemia in the setting of the above -Cath as above -ASA -Coreg  4. Lung carcinoma/squamous cell carcinoma of the nasopharynx: -Per oncology   5. COPD: -Taper steroids as soon as possible  6. Hyperkalemia: -In the setting of the above -Hold lisinopril for now    For questions or updates, please contact [place] Please consult [email or web address] for contact info under Cardiology/STEMI.    Signed, Christell Faith, PA-C [place] Pager: [phone] 11/19/2018, 7:26 AM

## 2018-11-19 NOTE — Plan of Care (Signed)
SBP 90s.  Lasix dose held at this time per Dr Marcille Blanco and rescheduled to later this AM.

## 2018-11-19 NOTE — Progress Notes (Signed)
Pt returned from cardiac catheterization lab.  Mynx closure to right femoral access CD&I; no hematoma or bleeding noted.  Peripheral pulses to RLE palpable. Bed low, brakes locked, call bell within reach, and family at bedside.

## 2018-11-19 NOTE — Progress Notes (Signed)
[place] [facility] [place] NAME: Jason Bryan    MR#:  [id number]  DATE OF BIRTH:  15-Aug-1955  SUBJECTIVE:  CHIEF COMPLAINT:   Chief Complaint  Patient presents with  . Leg Swelling  . Shortness of Breath   Patient states this morning he is feeling a little bit better. No active chest pain or abdominal pain. He is not sure if he is on oxygen at home. He is not short of breath now. Laying down in bed comfortably.  REVIEW OF SYSTEMS:  Review of Systems  Constitutional: Negative for chills, fever and malaise/fatigue.  Respiratory: Positive for cough, shortness of breath and wheezing.   Cardiovascular: Positive for leg swelling. Negative for chest pain and palpitations.  Gastrointestinal: Negative for abdominal pain, constipation, diarrhea, nausea and vomiting.  Neurological: Negative for focal weakness, loss of consciousness and weakness.   DRUG ALLERGIES:  No Known Allergies VITALS:  Blood pressure (!) 90/59, pulse 73, temperature 97.7 F (36.5 C), temperature source Oral, resp. rate 17, height 5\' 6"  (1.676 m), weight 81 kg, SpO2 94 %. PHYSICAL EXAMINATION:  Physical Exam  Gen: Alert and Oriented x 3, NAD HEENT: Normocephalic, atraumatic CV: RRR, no murmurs, normal S1, S2 split Resp: CTAB, diffuse moderate wheezing bilaterally, no rales, or rhonchi, comfortable work of breathing Abd: non-distended, non-tender, soft, +bs in all four quadrants Ext: no clubbing, cyanosis, +2 edema bilaterally above the ankles Neuro: No gross deficits Skin: warm, dry, intact, no rashes  LABORATORY PANEL:  Male CBC Recent Labs  Lab 11/18/18 1200  WBC 8.7  HGB 16.1  HCT 48.4  PLT 240   ------------------------------------------------------------------------------------------------------------------ Chemistries  Recent Labs  Lab 11/19/18 0519  NA 140  K 5.2*  CL 94*  CO2 35*  GLUCOSE 128*  BUN 48*  CREATININE 1.30*  CALCIUM 8.4*    RADIOLOGY:  No results found.  CXR FINDINGS: There is a new, moderate right pleural effusion with associated atelectasis or consolidation. There is mild, diffuse interstitial opacity elsewhere throughout. There are nodules of the bilateral upper lobes, not significantly changed compared to prior examination. Cardiomegaly.  IMPRESSION: 1. There is a new, moderate right pleural effusion with associated atelectasis or consolidation.  2. There is mild, diffuse interstitial opacity elsewhere throughout, consistent with edema.  3. There are nodules of the bilateral upper lobes, not significantly changed compared to prior examination and in keeping with known lung malignancy. ASSESSMENT AND PLAN:   Acute CHF IV Lasix, continue home dose ACE inhibitor and small dose beta-blocker and baby aspirin added to the regimen. Patient had LHC and RHC this am and was hypotensive and transferred to ICU to start on pressors. BNP elevated at >3000, CXR showing pleural effusion on the right side which is new. Echocardiogram showing LVEF <20% with left ventricular hypokinesis diffusely. Left atria dilated mildly. - Holding Lasix and Coreg until BP stabilizes - Repeat CXR in am - Strict I/Os - Daily weights - Cardiology consulted; Left and Right Heart Cath performed today; will await results.   Acute COPD. Acute on chronic. Wheezing this am and O2 sats >88% on 2L [place] of O2. Likely secondary from tobacco use disorder. See also lung cancer problem below. - Cont Solumedrol 60mg  IV BID - Azithromycin 250mg  daily for a total of 5 days treatment - Cont Duonebs and Albuterol nebs prn - Cont Dulera 2 puffs BID  Hyperkalemia. Acute. 5.2 this am. No chest pain or palpitations.  - Will monitor with BMP in  am. EKG with t-wave inversion in lateral leads, sinus tachycardia and LA enlargement. - Can give Lokelma if develops symptoms of arrhythmia and do stat recheck of potassium - Repeat EKG in am  Insomnia.  Chronic. Stable. - Cont Trazodone  Lung cancer. Patient sees Dr. Donella Stade for radiation therapy. - Follow up with oncology outpatient  Tobacco abuse disorder. Patient states he quit approximately 2 months ago. - Cont nicotine patch    All the records are reviewed and case discussed with Care Management/Social Worker. Management plans discussed with the patient, family and they are in agreement.  CODE STATUS: No Order  TOTAL TIME TAKING CARE OF THIS PATIENT: 40 minutes.   More than 50% of the time was spent in counseling/coordination of care: YES  POSSIBLE D/C IN 2-3 DAYS, DEPENDING ON CLINICAL CONDITION.   Nuala Alpha M.D on 11/19/2018 at 11:46 AM  Between 7am to 6pm - Pager - [phone]  After 6pm go to [email or web address] - Proofreader  [employer] [facility]  Office  [phone]  CC: Primary care physician; Cletis Athens, MD  Note: This dictation was prepared with Dragon dictation along with smaller phrase technology. Any transcriptional errors that result from this process are unintentional.

## 2018-11-19 NOTE — Plan of Care (Signed)

## 2018-11-19 NOTE — Progress Notes (Signed)
Notified BP 84/59 with heart rate of 73 bpm. Hold Coreg and Lasix. Lisinopril and Cardizem were already discontinued this morning. Will give a 500 cc NS bolus. Following cath, he will likely need transfer to ICU for milrinone infusion to augment diuresis.

## 2018-11-19 NOTE — Consult Note (Signed)
Name: Jason Bryan MRN: [id number] DOB: 12-Feb-1956    ADMISSION DATE:  11/18/2018 CONSULTATION DATE: 11/19/2018  REFERRING MD : Dr. Rockey Situ   CHIEF COMPLAINT: Bilateral Ankle Swelling   BRIEF PATIENT DESCRIPTION:  63 yo male admitted to the telemetry unit 09/2 with acute hypoxic respiratory failure and bilateral lower extremity edema secondary to acute systolic CHF/cardiorenal syndrome (Cardiac Cath 09/3: EF <20%) pt developed hypotension and worsening renal function on 9/3 requiring transfer to ICU for iv lasix and milrinone gtt   SIGNIFICANT EVENTS/STUDIES:  09/2-Pt admitted to the telemetry unit with acute hypoxic respiratory failure secondary to presumed acute diastolic CHF exacerbation and moderate size right pleural effusion.  Cardiology consulted   09/2-Echo revealed the left ventricle has severely reduced systolic function, with an ejection fraction of <20%. The cavity size was moderately dilated. Left ventricular diastolic Doppler parameters are indeterminate. Left ventricular diffuse hypokinesis. The right ventricle has mildly reduced systolic function. The cavity was mildly enlarged. There is no increase in right ventricular wall thickness. Right ventricular systolic pressure is mildly elevated with an estimated pressure of 36.1 mmHg. Left atrial size was mildly dilated. Mitral valve regurgitation is moderate 09/3-Prior to cardiac cath pt developed hypotension and lab results revealed worsening renal function, therefore per cardiology coreg and lasix held and 500 ml bolus administered 09/3-Cardiac Catheterization revealed nonobstructive coronary disease, mildly elevated right heart pressures. Severely reduced LV function, and echocardiogram estimate less than 20%.  Palliative Care consulted to discuss goals of treatment  09/3-Per Cardiology recommendations pt transferred to ICU for milrinone gtt and iv lasix.  Per cardiology if pt deteriorate further may need transfer to [facility] to  the advanced heart failure team prior to the weekend.    HISTORY OF PRESENT ILLNESS:  This is a 63 yo male with a PMH of Stage IVB Squamous Cell Carcinoma of the Nasopharynx (dx 2012), COPD, and Current Everyday Smoker.  He presented to [facility] ER on 09/2 with c/o bilateral ankle swelling and worsening shortness of breath onset of symptoms 08/30.  In the ER pt hypoxic O2 sats 82% following transfer from wheelchair to bed with audible wheezes, O2 sats improved after pt placed on 4L O2 via nasal canula.  CXR concerning for new moderate size pleural effusion, pulmonary edema, and bilateral upper lobe nodules. EKG showed sinus tachycardia hr 107 and lateral T wave inversions. Lab results revealed glucose 106, BUN 42, BNP 3,146, troponin 45, and COVID-19 negative.  He received iv lasix, duonebs, and steroids.  He was subsequently admitted to the telemetry unit by hospitalist team for additional workup and treatment. Detailed hospital course listed under significant events.    Recent Oncology History: Stage IIB Poorly Differrentiated Lung Cancer undewent chemo and radiation dx 2015, pt did well post treatment.  CT Chest on 06/09/2018 revealed a growing RUL nodule (0.8 cm to 1.7 cm) and LUL area (slightly increased) at site of previous radiation.  PET scan on 06/25/2018 revealed lobe lesion consistent with malignancy, unclear significance of left upper lobe lesion but could be a low-grade neoplasm, and no evidence of mediastinal disease. Per oncology recommendation pt underwent SBRT to the right upper lobe and tolerated procedure well plan for follow-up CT Chest in 4 months according to outpatient radiation oncology notes 09/09/2018.     PAST MEDICAL HISTORY :   has a past medical history of Cancer ([place]) (2011), Lung cancer, upper lobe ([place]) (04/11/2015), and Squamous cell lung cancer ([place]) (2015).  has a past surgical history that  includes RIGHT/LEFT HEART CATH AND CORONARY ANGIOGRAPHY (N/A, 11/19/2018). Prior to  Admission medications   Medication Sig Start Date End Date Taking? Authorizing Provider  albuterol (VENTOLIN HFA) 108 (90 Base) MCG/ACT inhaler  09/07/18  Yes [provider]  budesonide-formoterol (SYMBICORT) 160-4.5 MCG/ACT inhaler Inhale 1 puff into the lungs daily. 02/01/16  Yes Corcoran, Melissa C, MD  temazepam (RESTORIL) 30 MG capsule Take 30 mg by mouth at bedtime.  03/06/16  Yes [provider]  traZODone (DESYREL) 100 MG tablet Take 100 mg by mouth at bedtime. 11/17/18  Yes [provider]  CARTIA XT 120 MG 24 hr capsule Take 120 mg by mouth daily.  10/11/16   [provider]  furosemide (LASIX) 20 MG tablet Take 20 mg by mouth daily.  08/29/16   [provider]  lisinopril (PRINIVIL,ZESTRIL) 10 MG tablet Take 10 mg by mouth daily.  09/28/16   [provider]   No Known Allergies  FAMILY HISTORY:  family history includes Cancer in his mother. SOCIAL HISTORY:  reports that he has quit smoking. His smoking use included cigarettes. He has a 40.00 pack-year smoking history. He has never used smokeless tobacco. He reports current alcohol use of about 7.0 standard drinks of alcohol per week. He reports that he does not use drugs.  REVIEW OF SYSTEMS: Positives in BOLD   Constitutional: Negative for fever, chills, weight loss, malaise/fatigue and diaphoresis.  HENT: Negative for hearing loss, ear pain, nosebleeds, congestion, sore throat, neck pain, tinnitus and ear discharge.   Eyes: Negative for blurred vision, double vision, photophobia, pain, discharge and redness.  Respiratory: cough, hemoptysis, sputum production, shortness of breath, wheezing and stridor.   Cardiovascular: chest pain, palpitations, orthopnea, claudication, leg swelling and PND.  Gastrointestinal: Negative for heartburn, nausea, vomiting, abdominal pain, diarrhea, constipation, blood in stool and melena.  Genitourinary: Negative for dysuria, urgency, frequency, hematuria  and flank pain.  Musculoskeletal: Negative for myalgias, back pain, joint pain and falls.  Skin: Negative for itching and rash.  Neurological: Negative for dizziness, tingling, tremors, sensory change, speech change, focal weakness, seizures, loss of consciousness, weakness and headaches.  Endo/Heme/Allergies: Negative for environmental allergies and polydipsia. Does not bruise/bleed easily.  SUBJECTIVE:  Patient reports mild wheezing and mild shortness of breath and lower extremity edema Denies chest pain, palpitations, fever/chills, abdominal pain, nausea vomiting On 3 L nasal cannula  VITAL SIGNS: Temp:  [97.5 F (36.4 C)-98.4 F (36.9 C)] 97.8 F (36.6 C) (09/03 1418) Pulse Rate:  [68-93] 76 (09/03 1545) Resp:  [15-22] 15 (09/03 1345) BP: (83-93)/(54-65) 87/56 (09/03 1545) SpO2:  [92 %-97 %] 92 % (09/03 1545) Weight:  [81 kg-81.3 kg] 81 kg (09/03 1110)  PHYSICAL EXAMINATION: General: Acutely ill-appearing male, laying in bed, on 3 L nasal cannula, no acute distress Neuro: Sleeping, arouses to voice, follows commands, no focal deficits HEENT: Atraumatic, normocephalic, neck supple, JVD positive, pupils PERRLA Cardiovascular: Regular rate and rhythm, S1-S2, no murmurs rubs or gallops Lungs: Diffuse mild wheezing bilaterally, even, nonlabored, normal effort Abdomen: Soft, nondistended, nontender, no guarding or rebound tenderness, bowel sounds positive x4 Musculoskeletal: Normal bulk and tone, no deformities, 2+ edema bilateral lower extremities Skin: Warm and dry, no obvious rashes, lesions, or ulcerations  Recent Labs  Lab 11/18/18 1200 11/19/18 0519  NA 139 140  K 4.8 5.2*  CL 96* 94*  CO2 32 35*  BUN 42* 48*  CREATININE 1.17 1.30*  GLUCOSE 106* 128*   Recent Labs  Lab 11/18/18 1200  HGB  16.1  HCT 48.4  WBC 8.7  PLT 240   Dg Chest Portable 1 View  Result Date: 11/18/2018 CLINICAL DATA:  Shortness of breath, ankle swelling and progressive shortness of breath,  history of upper lobe lung cancer EXAM: PORTABLE CHEST 1 VIEW COMPARISON:  08/07/2018, CT chest, 06/09/2018 FINDINGS: There is a new, moderate right pleural effusion with associated atelectasis or consolidation. There is mild, diffuse interstitial opacity elsewhere throughout. There are nodules of the bilateral upper lobes, not significantly changed compared to prior examination. Cardiomegaly. IMPRESSION: 1. There is a new, moderate right pleural effusion with associated atelectasis or consolidation. 2. There is mild, diffuse interstitial opacity elsewhere throughout, consistent with edema. 3. There are nodules of the bilateral upper lobes, not significantly changed compared to prior examination and in keeping with known lung malignancy. Electronically Signed   By: Eddie Candle M.D.   On: 11/18/2018 11:50    ASSESSMENT / PLAN:  Acute hypoxic respiratory failure secondary to acute new onset systolic CHF exacerbation, moderate size right pleural effusion, AECOPD  & Underlying lung cancer Hx: Current everyday smoker and Stage IIB Poorly Differrentiated Lung Cancer -Supplemental O2 for dyspnea and/or hypoxia  -Follow intermittent CXR & ABG as needed -Cardiology consulted appreciate input-per recommendations continue milrinone gtt and iv lasix  -Scheduled and prn bronchodilator therapy  -Continue iv steroids, wean as tolerated  -Continue azithromycin  -Follow with Oncology as outpatient (sees Dr. Donella Stade) -[place] following, appreciate input  Cardiogenic shock secondary to Acute Decompensated HFrEE (EF <20%) -Continuous cardiac monitoring -Maintain MAP >65 -Cardiology following, appreciate input -Milrinone gtt as per Cardiology, with plan to start IV Lasix on 9/4 as BP and renal function permit -BB, ACE,ARB, Entresto, Spironolactone are deferred for now due to hypotension -If pt were to deteriorate, will likely need transfer to [facility] for Advanced Heart Failure Team  AKI secondary to  Cardiorenal Syndrome -Monitor I&O's / urinary output -Follow BMP -Ensure adequate renal perfusion -Avoid nephrotoxic agents as able -Replace electrolytes as indicated -Continue Milrinone gtt       DISPOSITION: ICU GOALS OF CARE: Full Code VTE Prophylaxis: Lovenox SQ UPDATES: Updated pt at bedside 11/19/2018  Darel Hong, [facility] [place] [facility] Pager: [phone] Cell: [phone]

## 2018-11-20 ENCOUNTER — Inpatient Hospital Stay: Payer: Medicare Other

## 2018-11-20 DIAGNOSIS — I5023 Acute on chronic systolic (congestive) heart failure: Secondary | ICD-10-CM

## 2018-11-20 DIAGNOSIS — Z7189 Other specified counseling: Secondary | ICD-10-CM

## 2018-11-20 DIAGNOSIS — Z515 Encounter for palliative care: Secondary | ICD-10-CM

## 2018-11-20 DIAGNOSIS — I509 Heart failure, unspecified: Secondary | ICD-10-CM

## 2018-11-20 DIAGNOSIS — R600 Localized edema: Secondary | ICD-10-CM

## 2018-11-20 DIAGNOSIS — J441 Chronic obstructive pulmonary disease with (acute) exacerbation: Secondary | ICD-10-CM

## 2018-11-20 LAB — BASIC METABOLIC PANEL
Anion gap: 7 (ref 5–15)
Anion gap: 11 (ref 5–15)
BUN: 43 mg/dL — ABNORMAL HIGH (ref 8–23)
BUN: 39 mg/dL — ABNORMAL HIGH (ref 8–23)
CO2: 37 mmol/L — ABNORMAL HIGH (ref 22–32)
CO2: 40 mmol/L — ABNORMAL HIGH (ref 22–32)
Calcium: 8.7 mg/dL — ABNORMAL LOW (ref 8.9–10.3)
Calcium: 9 mg/dL (ref 8.9–10.3)
Chloride: 98 mmol/L (ref 98–111)
Chloride: 90 mmol/L — ABNORMAL LOW (ref 98–111)
Creatinine, Ser: 1.01 mg/dL (ref 0.61–1.24)
Creatinine, Ser: 1.01 mg/dL (ref 0.61–1.24)
GFR calc Af Amer: >60 mL/min (ref >60)
GFR calc Af Amer: >60 mL/min (ref >60)
GFR calc non Af Amer: >60 mL/min (ref >60)
GFR calc non Af Amer: >60 mL/min (ref >60)
Glucose, Bld: 155 mg/dL — ABNORMAL HIGH (ref 70–99)
Glucose, Bld: 189 mg/dL — ABNORMAL HIGH (ref 70–99)
Potassium: 4.2 mmol/L (ref 3.5–5.1)
Potassium: 3.8 mmol/L (ref 3.5–5.1)
Sodium: 142 mmol/L (ref 135–145)
Sodium: 141 mmol/L (ref 135–145)

## 2018-11-20 LAB — POTASSIUM: Potassium: 4.1 mmol/L (ref 3.5–5.1)

## 2018-11-20 LAB — MAGNESIUM
Magnesium: 2.4 mg/dL (ref 1.7–2.4)
Magnesium: 2 mg/dL (ref 1.7–2.4)

## 2018-11-20 LAB — CBC
HCT: 47.6 % (ref 39.0–52.0)
Hemoglobin: 15.6 g/dL (ref 13.0–17.0)
MCH: 28.4 pg (ref 26.0–34.0)
MCHC: 32.8 g/dL (ref 30.0–36.0)
MCV: 86.5 fL (ref 80.0–100.0)
Platelets: 251 10*3/uL (ref 150–400)
RBC: 5.5 MIL/uL (ref 4.22–5.81)
RDW: 16.9 % — ABNORMAL HIGH (ref 11.5–15.5)
WBC: 11.3 10*3/uL — ABNORMAL HIGH (ref 4.0–10.5)
nRBC: 0 % (ref 0.0–0.2)

## 2018-11-20 LAB — CREATININE, SERUM
Creatinine, Ser: 0.85 mg/dL (ref 0.61–1.24)
GFR calc Af Amer: >60 mL/min (ref >60)
GFR calc non Af Amer: >60 mL/min (ref >60)

## 2018-11-20 LAB — PHOSPHORUS: Phosphorus: 2.4 mg/dL — ABNORMAL LOW (ref 2.5–4.6)

## 2018-11-20 MED ORDER — AMIODARONE HCL IN DEXTROSE 360-4.14 MG/200ML-% IV SOLN: 30.0000 mg/h | INTRAVENOUS | Status: DC

## 2018-11-20 MED ORDER — POTASSIUM PHOSPHATES 15 MMOLE/5ML IV SOLN
30.0000 mmol | Freq: Once | INTRAVENOUS | Status: AC
  Administered 2018-11-20: 30 mmol via INTRAVENOUS
  Filled 2018-11-20: qty 10

## 2018-11-20 MED ORDER — FUROSEMIDE 10 MG/ML IJ SOLN
40.0000 mg | Freq: Two times a day (BID) | INTRAMUSCULAR | Status: DC
  Administered 2018-11-20 – 2018-11-24 (×9): 40 mg via INTRAVENOUS
  Filled 2018-11-20 (×10): qty 4

## 2018-11-20 MED ORDER — AMIODARONE HCL IN DEXTROSE 360-4.14 MG/200ML-% IV SOLN
60.0000 mg/h | INTRAVENOUS | Status: DC
  Filled 2018-11-20: qty 200

## 2018-11-20 MED ORDER — PHENYLEPHRINE HCL-NACL 10-0.9 MG/250ML-% IV SOLN
0.0000 ug/min | INTRAVENOUS | Status: DC
  Filled 2018-11-20: qty 250

## 2018-11-20 MED ORDER — AMIODARONE LOAD VIA INFUSION
150.0000 mg | Freq: Once | INTRAVENOUS | Status: DC
  Filled 2018-11-20: qty 83.34

## 2018-11-20 MED ORDER — CARVEDILOL 3.125 MG PO TABS
3.1250 mg | ORAL_TABLET | Freq: Two times a day (BID) | ORAL | Status: DC
  Administered 2018-11-20 – 2018-11-23 (×5): 3.125 mg via ORAL
  Filled 2018-11-20 (×7): qty 1

## 2018-11-20 MED ORDER — METOPROLOL TARTRATE 5 MG/5ML IV SOLN
5.0000 mg | Freq: Once | INTRAVENOUS | Status: AC
  Administered 2018-11-20: 21:00:00 5 mg via INTRAVENOUS

## 2018-11-20 MED ORDER — POTASSIUM CHLORIDE CRYS ER 20 MEQ PO TBCR
20.0000 meq | EXTENDED_RELEASE_TABLET | Freq: Two times a day (BID) | ORAL | Status: DC
  Administered 2018-11-20 – 2018-11-24 (×8): 20 meq via ORAL
  Filled 2018-11-20 (×9): qty 1

## 2018-11-20 MED ORDER — METOPROLOL TARTRATE 5 MG/5ML IV SOLN
INTRAVENOUS | Status: AC
  Administered 2018-11-20: 21:00:00 5 mg via INTRAVENOUS
  Filled 2018-11-20: qty 5

## 2018-11-20 NOTE — Progress Notes (Addendum)
[place] [facility] [place] NAME: Jason Bryan    MR#:  [id number]  DATE OF BIRTH:  1955/09/17  SUBJECTIVE:   Feeling bad this morning.  He feels overall very weak.  He endorses shortness of breath and lower extremity edema.  No chest pain.  REVIEW OF SYSTEMS:  Review of Systems  Constitutional: Negative for chills, fever and malaise/fatigue.  HENT: Negative for congestion and sore throat.   Respiratory: Positive for cough, shortness of breath and wheezing.   Cardiovascular: Positive for leg swelling. Negative for chest pain and palpitations.  Gastrointestinal: Negative for abdominal pain, constipation, diarrhea, nausea and vomiting.  Neurological: Negative for focal weakness, loss of consciousness and weakness.   DRUG ALLERGIES:  No Known Allergies VITALS:  Blood pressure (!) 99/57, pulse 90, temperature 97.8 F (36.6 C), temperature source Axillary, resp. rate 19, height 5\' 6"  (1.676 m), weight 81.9 kg, SpO2 97 %. PHYSICAL EXAMINATION:  Physical Exam  GENERAL:   Sleepy but easily arousable.  In no acute distress. HEENT: Head atraumatic, normocephalic. Pupils equal, round, reactive to light and accommodation. No scleral icterus. Extraocular muscles intact. Oropharynx and nasopharynx clear. +JVD NECK:  Supple, no jugular venous distention. No thyroid enlargement. LUNGS: + Diminished breath sounds in the lung bases bilaterally. No wheezes, crackles, rhonchi. No use of accessory muscles of respiration.  CARDIOVASCULAR: Mildly tachycardic, regular rhythm, S1, S2 normal. No murmurs, rubs, or gallops.  ABDOMEN: Soft, nontender, nondistended. Bowel sounds present.  EXTREMITIES: No cyanosis, or clubbing. 1+ bilateral pitting edema. NEUROLOGIC: CN 2-12 intact, no focal deficits. + Global weakness. Sensation intact throughout. Gait not checked.  PSYCHIATRIC: The patient is alert and oriented x 3.  SKIN: No obvious rash, lesion, or ulcer.   LABORATORY  PANEL:  Male CBC Recent Labs  Lab 11/20/18 0516  WBC 11.3*  HGB 15.6  HCT 47.6  PLT 251   ------------------------------------------------------------------------------------------------------------------ Chemistries  Recent Labs  Lab 11/20/18 0516 11/20/18 1053  NA 142  --   K 4.2  --   CL 98  --   CO2 37*  --   GLUCOSE 155*  --   BUN 43*  --   CREATININE 1.01  --   CALCIUM 8.7*  --   MG  --  2.4   RADIOLOGY:  Ct Head Wo Contrast  Result Date: 11/20/2018 CLINICAL DATA:  Altered level of consciousness EXAM: CT HEAD WITHOUT CONTRAST TECHNIQUE: Contiguous axial images were obtained from the base of the skull through the vertex without intravenous contrast. COMPARISON:  08/14/2016 FINDINGS: Brain: No evidence of acute infarction, hemorrhage, hydrocephalus, extra-axial collection or mass lesion/mass effect. Vascular: No hyperdense vessel or unexpected calcification. Skull: Normal. Negative for fracture or focal lesion. Sinuses/Orbits: No acute finding. Other: None. IMPRESSION: Normal head CT for age Electronically Signed   By: Inez Catalina M.D.   On: 11/20/2018 12:22   Dg Chest Port 1 View  Result Date: 11/20/2018 CLINICAL DATA:  CHF exacerbation EXAM: PORTABLE CHEST 1 VIEW COMPARISON:  11/18/2018 FINDINGS: Cardiac shadow remains mildly enlarged. Mild to moderate right-sided pleural effusion is again identified. Some interval clearing in the right base is noted. Mild central vascular congestion is seen. Tiny left pleural effusion is noted as well. Previously seen left upper lobe nodule is again identified but somewhat obscured by overlying artifact. IMPRESSION: Bilateral pleural effusions right greater than left. Interval improved aeration is noted in the right base. Electronically Signed   By: Linus Mako.D.  On: 11/20/2018 05:48    CXR FINDINGS: There is a new, moderate right pleural effusion with associated atelectasis or consolidation. There is mild, diffuse interstitial  opacity elsewhere throughout. There are nodules of the bilateral upper lobes, not significantly changed compared to prior examination. Cardiomegaly.  IMPRESSION: 1. There is a new, moderate right pleural effusion with associated atelectasis or consolidation.  2. There is mild, diffuse interstitial opacity elsewhere throughout, consistent with edema.  3. There are nodules of the bilateral upper lobes, not significantly changed compared to prior examination and in keeping with known lung malignancy. ASSESSMENT AND PLAN:   Acute hypoxic respiratory failure secondary to acute systolic congestive heart failure and acute COPD exacerbation- remains on 6 L O2 -Wean O2 as able -Duonebs prn  Acute systolic congestive heart failure with non-ischemic cardiomyopathy- may be due to long history of alcohol use. -ECHO with EF <20%. -S/p cardiac catheterization yesterday with nonobstructive coronary artery disease -Cardiology following -Continue Lasix 40 mg IV daily -Continue IV milrinone -Start Coreg 3.125 mg twice daily -Palliative care following  Acute COPD exacerbation -Continue IV Solu-Medrol -Continue azithromycin -Continue home inhalers -Duonebs PRN  AKI- due to cardiorenal syndrome.  AKI has resolved. -Avoid nephrotoxic agents -Monitor  Leukocytosis- likely due to steroids. No signs of infection. -Monitor  Lung cancer- follows with Dr. Donella Stade for radiation therapy. -Follow-up with oncology outpatient  Tobacco use -Continue nicotine patch  All the records are reviewed and case discussed with Care Management/Social Worker. Management plans discussed with the patient, family and they are in agreement.  CODE STATUS: Full  TOTAL TIME TAKING CARE OF THIS PATIENT: 40 minutes.   More than 50% of the time was spent in counseling/coordination of care: YES  POSSIBLE D/C unknown, DEPENDING ON CLINICAL CONDITION.   Berna Spare Mayo M.D on 11/20/2018 at 3:47 PM  Between 7am to  6pm - Pager - [phone]  After 6pm go to [email or web address] - Proofreader  [facility]  Office  [phone]  CC: Primary care physician; Cletis Athens, MD  Note: This dictation was prepared with Dragon dictation along with smaller phrase technology. Any transcriptional errors that result from this process are unintentional.

## 2018-11-20 NOTE — Consult Note (Addendum)
Consultation Note Date: 11/20/2018   Patient Name: Jason Bryan  DOB: February 18, 1956  MRN: [id number]  Age / Sex: 63 y.o., male  PCP: Cletis Athens, MD Referring Physician: Sela Hua, MD  Reason for Consultation: Establishing goals of care  HPI/Patient Profile: Jason Bryan  is a 63 y.o. 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  Clinical Assessment and Goals of Care: Patient is resting in bed. He does not respond to voice or touch. Wife at bedside. She states they have children, but is not sure if they would be able to provide assistance at home.   Functionally, she states he has been more tired and would lean against walls or counters to rest. He spends a lot of his time watching t.v. She states he has had coughing after eating at times. She states he had not wanted to see a doctor.    We discussed his diagnoses, prognosis, GOC, EOL wishes disposition and options.  A detailed discussion was had today regarding advanced directives.  Concepts specific to code status, artifical feeding and hydration, IV antibiotics and rehospitalization were discussed.  The difference between an aggressive medical intervention path and a comfort care path was discussed.  Values and goals of care important to patient and family were attempted to be elicited.  Discussed limitations of medical interventions to prolong quality of life in some situations and discussed the concept of human mortality. We discussed his heart failure and COPD are progressive.   She states this is all so quick for her. She states they never had these kinds of conversations in the past and is unsure what he would want. She understands  the severity of his heart disease and understands his time is limited, and life will be different after discharge. They are attempting Milrinone therapy to provide more time with the family. She is unsure if he would be accepting of SNF placement if he is not strong enough to come home. She is unsure if he would want a feeding tube. She is considering code status and intubation, and states she will need to speak with the family. She states she does not want him to suffer. She states his current status would not be acceptable. She is waiting to see if Milrinone will help quality of life.   Will meet with family Monday. She may request a E link meeting over the weekend.      SUMMARY OF RECOMMENDATIONS   Continue current care. Discussed GOC. She is considering options on code status. She is waiting to see if Milrinone helps him.   Prognosis:   < 6 months  Discharge Planning: To Be Determined      Primary Diagnoses: Present on Admission: **None**   I have reviewed the medical record, interviewed the patient and family, and examined  the patient. The following aspects are pertinent.  Past Medical History:  Diagnosis Date  . Cancer ([place]) 2011   squemous stage 3, head and neck Ca with chemo + rad tx's.   . Lung cancer, upper lobe ([facility]) 04/11/2015  . Squamous cell lung cancer ([place]) 2015   Right Lung CA with chemo + rad tx's.   Social History   Socioeconomic History  . Marital status: Married    Spouse name: Not on file  . Number of children: Not on file  . Years of education: Not on file  . Highest education level: Not on file  Occupational History  . Not on file  Social Needs  . Financial resource strain: Not on file  . Food insecurity    Worry: Not on file    Inability: Not on file  . Transportation needs    Medical: Not on file    Non-medical: Not on file  Tobacco Use  . Smoking status: Former Smoker    Packs/day: 1.00    Years: 40.00    Pack years: 40.00    Types:  Cigarettes  . Smokeless tobacco: Never Used  Substance and Sexual Activity  . Alcohol use: Yes    Alcohol/week: 7.0 standard drinks    Types: 7 Cans of beer per week    Comment: 1 can beer a day  . Drug use: No  . Sexual activity: Not on file  Lifestyle  . Physical activity    Days per week: Not on file    Minutes per session: Not on file  . Stress: Not on file  Relationships  . Social Herbalist on phone: Not on file    Gets together: Not on file    Attends religious service: Not on file    Active member of club or organization: Not on file    Attends meetings of clubs or organizations: Not on file    Relationship status: Not on file  Other Topics Concern  . Not on file  Social History Narrative  . Not on file   Family History  Problem Relation Age of Onset  . Cancer Mother    Scheduled Meds: . aspirin EC  81 mg Oral Daily  . azithromycin  250 mg Oral Daily  . carvedilol  3.125 mg Oral BID WC  . Chlorhexidine Gluconate Cloth  6 each Topical Daily  . enoxaparin (LOVENOX) injection  40 mg Subcutaneous QHS  . furosemide  40 mg Intravenous BID  . ipratropium-albuterol  3 mL Nebulization TID  . methylPREDNISolone (SOLU-MEDROL) injection  60 mg Intravenous Q8H  . mometasone-formoterol  2 puff Inhalation BID  . nicotine  7 mg Transdermal Daily  . potassium chloride  20 mEq Oral BID  . sodium chloride flush  3 mL Intravenous Q12H  . temazepam  30 mg Oral QHS  . traZODone  100 mg Oral QHS   Continuous Infusions: . milrinone 0.25 mcg/kg/min (11/20/18 0500)   PRN Meds:.albuterol, ipratropium-albuterol Medications Prior to Admission:  Prior to Admission medications   Medication Sig Start Date End Date Taking? Authorizing Provider  albuterol (VENTOLIN HFA) 108 (90 Base) MCG/ACT inhaler  09/07/18  Yes [provider]  budesonide-formoterol (SYMBICORT) 160-4.5 MCG/ACT inhaler Inhale 1 puff into the lungs daily. 02/01/16  Yes Corcoran, Melissa C, MD   temazepam (RESTORIL) 30 MG capsule Take 30 mg by mouth at bedtime.  03/06/16  Yes [provider]  traZODone (DESYREL) 100 MG tablet Take 100 mg by  mouth at bedtime. 11/17/18  Yes [provider]  CARTIA XT 120 MG 24 hr capsule Take 120 mg by mouth daily.  10/11/16   [provider]  furosemide (LASIX) 20 MG tablet Take 20 mg by mouth daily.  08/29/16   [provider]  lisinopril (PRINIVIL,ZESTRIL) 10 MG tablet Take 10 mg by mouth daily.  09/28/16   [provider]   No Known Allergies Review of Systems  Unable to perform ROS   Physical Exam Constitutional:      Comments: Resting with eyes closed.   Pulmonary:     Effort: Pulmonary effort is normal.  Skin:    General: Skin is warm and dry.     Vital Signs: BP 97/65   Pulse 97   Temp 97.8 F (36.6 C) (Axillary)   Resp 20   Ht 5\' 6"  (1.676 m)   Wt 81.9 kg   SpO2 94%   BMI 29.14 kg/m  Pain Scale: 0-10   Pain Score: 0-No pain   SpO2: SpO2: 94 % O2 Device:SpO2: 94 % O2 Flow Rate: .O2 Flow Rate (L/min): 4 L/min  IO: Intake/output summary:   Intake/Output Summary (Last 24 hours) at 11/20/2018 1417 Last data filed at 11/20/2018 1300 Gross per 24 hour  Intake 1181.56 ml  Output 500 ml  Net 681.56 ml    LBM: Last BM Date: 11/20/18 Baseline Weight: Weight: 80.7 kg Most recent weight: Weight: 81.9 kg     Palliative Assessment/Data:     Time In: 12:10 Time Out: 2:00 Time Total: 110 Greater than 50%  of this time was spent counseling and coordinating care related to the above assessment and plan.  Signed by: Asencion Gowda, NP   Please contact Palliative Medicine Team phone at [phone] for questions and concerns.  For individual provider: See Shea Evans

## 2018-11-20 NOTE — Evaluation (Signed)
Clinical/Bedside Swallow Evaluation Patient Details  Name: Jason Bryan MRN: [id number] Date of Birth: 18-Feb-1956  Today's Date: 11/20/2018 Time: SLP Start Time (ACUTE ONLY): 1400 SLP Stop Time (ACUTE ONLY): 1445 SLP Time Calculation (min) (ACUTE ONLY): 45 min  Past Medical History:  Past Medical History:  Diagnosis Date  . Cancer ([facility]) 2011   squemous stage 3, head and neck Ca with chemo + rad tx's.   . Lung cancer, upper lobe ([place]) 04/11/2015  . Squamous cell lung cancer ([place]) 2015   Right Lung CA with chemo + rad tx's.   Past Surgical History:  Past Surgical History:  Procedure Laterality Date  . RIGHT/LEFT HEART CATH AND CORONARY ANGIOGRAPHY N/A 11/19/2018   Procedure: RIGHT/LEFT HEART CATH AND CORONARY ANGIOGRAPHY;  Surgeon: Minna Merritts, MD;  Location: [facility];  Service: Cardiovascular;  Laterality: N/A;   HPI:  Per H&P "Nagee Bryan  is a 63 y.o. 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  Patient admits 1 week history of worsening of lower extremity swelling associated with worsening of the shortness of breath with exertion.  Reports a productive cough and started smoking 3 to 4 cigarettes/day.  Wife at bedside.  Patient sees his primary care physician Dr. Rebecka Apley.  Chest x-ray with pulmonary edema and consolidation"  Nursing reports significant choking/coughing episode with breakfast this AM.   Assessment / Plan / Recommendation Clinical Impression  The patient is presenting with choking/coughing risk with textured solids.  He had an immediate and vigorous coughing spell with graham cracker and applesauce.  The coughing lasted for a while and his oxygen levels went down.  He did not cough with trials of apple sauce.  He is able to drink thin liquid  via cup rim but belches with each swallow.  The belching suggests that there may be esophageal dysphagia influencing oropharyngeal swallowing.  Recommend puree diet, eat SLOWLY and thin liquids- SMALL sips.  SLP will follow for diet toleration and ongoing assessment.     SLP Visit Diagnosis: Dysphagia, oropharyngeal phase (R13.12)    Aspiration Risk  Moderate aspiration risk    Diet Recommendation Dysphagia 1 (Puree);Thin liquid   Liquid Administration via: Cup;No straw Medication Administration: Whole meds with puree Supervision: Staff to assist with self feeding Compensations: Minimize environmental distractions;Slow rate;Small sips/bites Postural Changes: Seated upright at 90 degrees;Remain upright for at least 30 minutes after po intake    Other  Recommendations Recommended Consults: Consider GI evaluation Oral Care Recommendations: Oral care BID;Staff/trained caregiver to provide oral care   Follow up Recommendations        Frequency and Duration min 3x week  2 weeks       Prognosis Prognosis for Safe Diet Advancement: Fair      Swallow Study   General Date of Onset: 11/18/18 HPI: Per H&P "Jason Bryan  is a 63 y.o. 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  Patient admits 1 week history of worsening of lower extremity swelling associated with worsening of the shortness of breath with exertion.  Reports a  productive cough and started smoking 3 to 4 cigarettes/day.  Wife at bedside.  Patient sees his primary care physician Dr. Rebecka Apley.  Chest x-ray with pulmonary edema and consolidation"  Nursing reports significant choking/coughing episode with breakfast this AM. Type of Study: Bedside Swallow Evaluation Previous Swallow Assessment: None known Diet Prior to  this Study: Regular Behavior/Cognition: Alert(irritable but cooperative) Oral Cavity Assessment: Within Functional Limits Oral Cavity - Dentition: Edentulous Self-Feeding Abilities: Needs assist Patient Positioning: Upright in bed Baseline Vocal Quality: Normal Volitional Cough: Strong Volitional Swallow: Able to elicit    Oral/Motor/Sensory Function Overall Oral Motor/Sensory Function: Generalized oral weakness   Ice Chips     Thin Liquid Thin Liquid: Within functional limits Presentation: Cup;Self Fed Other Comments: Belches consistently    Nectar Thick     Honey Thick     Puree Puree: Within functional limits Presentation: Spoon   Solid     Solid: Impaired Presentation: Spoon Oral Phase Impairments: Other (comment)(Edentulous) Pharyngeal Phase Impairments: Cough - Immediate     Leroy Sea, MS/CCC- SLP  Valetta Fuller, Daine Floras 11/20/2018,4:39 PM

## 2018-11-20 NOTE — Progress Notes (Signed)
Progress Note  Patient Name: Jason Bryan Date of Encounter: 11/20/2018  Primary Cardiologist: [facility], Dr. Rockey Situ  Subjective   Denies CP, SOB, nausea, and symptoms of presyncope. Reports "I'm just tired."  Inpatient Medications    Scheduled Meds: . aspirin EC  81 mg Oral Daily  . azithromycin  250 mg Oral Daily  . Chlorhexidine Gluconate Cloth  6 each Topical Daily  . enoxaparin (LOVENOX) injection  40 mg Subcutaneous QHS  . ipratropium-albuterol  3 mL Nebulization TID  . methylPREDNISolone (SOLU-MEDROL) injection  60 mg Intravenous Q8H  . mometasone-formoterol  2 puff Inhalation BID  . nicotine  7 mg Transdermal Daily  . sodium chloride flush  3 mL Intravenous Q12H  . temazepam  30 mg Oral QHS  . traZODone  100 mg Oral QHS   Continuous Infusions: . milrinone 0.25 mcg/kg/min (11/20/18 0500)   PRN Meds: albuterol, ipratropium-albuterol   Vital Signs    Vitals:   11/20/18 0300 11/20/18 0400 11/20/18 0500 11/20/18 0600  BP: 106/67 108/67 109/70 119/76  Pulse: 87 91 92 93  Resp: 20 (!) 25 (!) 22 17  Temp:  97.7 F (36.5 C)    TempSrc:  Axillary    SpO2: 94% 90% 97% (!) 87%  Weight:   81.9 kg   Height:        Intake/Output Summary (Last 24 hours) at 11/20/2018 0743 Last data filed at 11/20/2018 0500 Gross per 24 hour  Intake 1113.89 ml  Output 350 ml  Net 763.89 ml   Last 3 Weights 11/20/2018 11/19/2018 11/19/2018  Weight (lbs) 180 lb 8.9 oz 181 lb 3.5 oz 178 lb 9.2 oz  Weight (kg) 81.9 kg 82.2 kg 81 kg      Telemetry    SR 80s-high 90s- Personally Reviewed  ECG    No new tracings today - Personally Reviewed  Physical Exam   GEN: No acute distress. Somewhat somnolent  Neck: +JVD elevated to angle of madible Cardiac: RRR, no murmurs, rubs, or gallops.  Respiratory: On [place]. Bibasilar crackles. Bilateral rhonchi, cleared with cough. Bilateral wheezing. GI: Soft, nontender, non-distended  MS: bilateral mild to moderate / 1+ pretibial edema; No deformity.  Neuro:  Nonfocal  Psych: Normal affect, somewhat agitated due to having just woken   Labs    High Sensitivity Troponin:   Recent Labs  Lab 11/18/18 1200 11/18/18 1741  TROPONINIHS 45* 42*      Cardiac EnzymesNo results for input(s): TROPONINI in the last 168 hours. No results for input(s): TROPIPOC in the last 168 hours.   Chemistry Recent Labs  Lab 11/18/18 1200 11/19/18 0519 11/20/18 0516  NA 139 140 142  K 4.8 5.2* 4.2  CL 96* 94* 98  CO2 32 35* 37*  GLUCOSE 106* 128* 155*  BUN 42* 48* 43*  CREATININE 1.17 1.30* 1.01  CALCIUM 9.1 8.4* 8.7*  GFRNONAA >60 57* >60  GFRAA >60 >60 >60  ANIONGAP 11 11 7      Hematology Recent Labs  Lab 11/18/18 1200 11/20/18 0516  WBC 8.7 11.3*  RBC 5.67 5.50  HGB 16.1 15.6  HCT 48.4 47.6  MCV 85.4 86.5  MCH 28.4 28.4  MCHC 33.3 32.8  RDW 17.1* 16.9*  PLT 240 251    BNP Recent Labs  Lab 11/18/18 1155  BNP 3,146.0*     DDimer No results for input(s): DDIMER in the last 168 hours.   Radiology    Dg Chest Port 1 View  Result Date: 11/20/2018 CLINICAL  DATA:  CHF exacerbation EXAM: PORTABLE CHEST 1 VIEW COMPARISON:  11/18/2018 FINDINGS: Cardiac shadow remains mildly enlarged. Mild to moderate right-sided pleural effusion is again identified. Some interval clearing in the right base is noted. Mild central vascular congestion is seen. Tiny left pleural effusion is noted as well. Previously seen left upper lobe nodule is again identified but somewhat obscured by overlying artifact. IMPRESSION: Bilateral pleural effusions right greater than left. Interval improved aeration is noted in the right base. Electronically Signed   By: Inez Catalina M.D.   On: 11/20/2018 05:48   Dg Chest Portable 1 View  Result Date: 11/18/2018 CLINICAL DATA:  Shortness of breath, ankle swelling and progressive shortness of breath, history of upper lobe lung cancer EXAM: PORTABLE CHEST 1 VIEW COMPARISON:  08/07/2018, CT chest, 06/09/2018 FINDINGS: There is  a new, moderate right pleural effusion with associated atelectasis or consolidation. There is mild, diffuse interstitial opacity elsewhere throughout. There are nodules of the bilateral upper lobes, not significantly changed compared to prior examination. Cardiomegaly. IMPRESSION: 1. There is a new, moderate right pleural effusion with associated atelectasis or consolidation. 2. There is mild, diffuse interstitial opacity elsewhere throughout, consistent with edema. 3. There are nodules of the bilateral upper lobes, not significantly changed compared to prior examination and in keeping with known lung malignancy. Electronically Signed   By: Eddie Candle M.D.   On: 11/18/2018 11:50    Cardiac Studies   [facility] 11/18/2018  Coronary angiography:  Coronary dominance: Right --Left mainstem:   Large vessel that bifurcates into the LAD and left circumflex, no significant disease noted --Left anterior descending (LAD):   Large vessel that extends to the apical region, diagonal branch 2 of moderate size, no significant disease noted --Left circumflex (LCx):  Large vessel with OM branch 2, no significant disease noted --Right coronary artery (RCA):  Right dominant vessel with PL and PDA, no significant disease noted --Left ventriculography: Left ventricular systolic function is normal, LVEF is estimated at 55-65%, there is no significant mitral regurgitation , no significant aortic valve stenosis  Right heart pressures RA mean 20 RV 40/10 mean 22 PA 40/24 mean 30 Wedge pressure mean 16 Cardiac output 3.69 Cardiac index 1.93  Final Conclusions:   Nonobstructive coronary disease Mildly elevated right heart pressures Severely reduced LV function Echocardiogram estimate less than 20%  Recommendations:  Medical management recommended For now would continue carvedilol 3.125 twice daily with losartan 25 daily Hold diltiazem Lasix 20 IV twice daily  Echo 11/18/2018  1. The left ventricle has severely  reduced systolic function, with an ejection fraction of <20%. The cavity size was moderately dilated. Left ventricular diastolic Doppler parameters are indeterminate. Left ventricular diffuse hypokinesis.  2. The right ventricle has mildly reduced systolic function. The cavity was mildly enlarged. There is no increase in right ventricular wall thickness. Right ventricular systolic pressure is mildly elevated with an estimated pressure of 36.1 mmHg.  3. Left atrial size was mildly dilated.  4. Mitral valve regurgitation is moderate  Patient Profile     63 y.o. male with a h/o squamous cell carcinoma of the nasopharynx, lung carcinoma, prior smoking history (40 pk yr), prior alcohol abuse, and COPD admitted with acute systolic CHF.  Assessment & Plan    HFrEF, NICM <20% --Remains volume up on exam, though denies SOB on [place] oxygen.  --Net +763.9 in last 24h. Wt 82.2kg  81.9kg. 9/2 BNP 3,146.0. --S/p L/RHC with elevated R heart pressures 36.67mmHg. Dilated CM, suspected etiology of long  history alcohol abuse. Echo and right heart pressure findings as above. --Cotinue IV milrinone with overnight improvement in pressures from SBP 80s to 110s and Cr as below. Wean milrinone as tolerated. --IV lasix 40mg  started with KCl tab 20mg , recheck of Cr/K at 1400. Monitor BP, Cr/BUN, K/Mg carefully with start of diuresis. Escalate diuresis as tolerated by renal function, BP.  --Defer Coreg 3.125mg  BID, losartan 25mg  daily until BP and renal function allow. At later date, transition to [facility], add spironolactone as tolerated by BP/Cr/K.   Acute on chronic renal failure, likely cardiorenal syndrome --Renal function improving with milrinone infusion. --Daily BMET. Cr 1.30  1.01 / BUN 48  43 --Monitor renal function and electrolytes carefully with start of diuresis.   Hyperkalemia, improved --K 5.2  4.2 (goal 4.0). Will check Mg with goal 2.0. --KCl repletion initiated with start of diuresis. Monitor with K  recheck at 1400.  Demand ischemia --S/p R/LHC without obstructive CAD, elevated R heart pressures. Dilated CM, suspected etiology of long history alcohol abuse. Minimally elevated HS Tn 45  42, not consistent with ACS. Continue ASA. Restart Coreg once pressures allow.  Lung carcinoma --Per oncology.  COPD, h/o tobacco abuse --Previous 40 pk year hsitory.  H/o EtOH abuse --Continued cessation advised.   For questions or updates, please contact [place] Please consult [email or web address] for contact info under        Signed, Arvil Chaco, PA-C  11/20/2018, 7:43 AM

## 2018-11-21 MED ORDER — SODIUM CHLORIDE 0.9 % IV SOLN
0.0000 ug/min | INTRAVENOUS | Status: DC
  Administered 2018-11-21 (×2): 20 ug/min via INTRAVENOUS
  Filled 2018-11-21 (×2): qty 10

## 2018-11-21 MED ORDER — SODIUM CHLORIDE 0.9 % IV SOLN
3.0000 g | Freq: Four times a day (QID) | INTRAVENOUS | Status: AC
  Administered 2018-11-21 – 2018-11-26 (×20): 3 g via INTRAVENOUS
  Filled 2018-11-21: qty 8
  Filled 2018-11-21 (×6): qty 3
  Filled 2018-11-21: qty 8
  Filled 2018-11-21: qty 3
  Filled 2018-11-21 (×3): qty 8
  Filled 2018-11-21 (×9): qty 3

## 2018-11-21 NOTE — Plan of Care (Signed)
Patient remains on Primacor.  Some confusion noted during shift.  Started patient on Neo for hypotension. MAP maintained within parameters.  Patient had run SVT x 1 this shift. PRN metoprolol given with good effect.  No c/o pain or discomfort. Good urine output.  Will continue to monitor.

## 2018-11-21 NOTE — Progress Notes (Signed)
Progress Note  Patient Name: Jason Bryan Date of Encounter: 11/21/2018  Primary Cardiologist: [facility], Dr. Rockey Situ  Subjective   No significant events overnight Hypertensive, pressors started by ICU team Still on milrinone infusion Significant coughing, sputum production, red in the face On several liters nasal cannula oxygen, saturations less than 90% on room air Wife at the bedside  Inpatient Medications    Scheduled Meds: . aspirin EC  81 mg Oral Daily  . azithromycin  250 mg Oral Daily  . carvedilol  3.125 mg Oral BID WC  . Chlorhexidine Gluconate Cloth  6 each Topical Daily  . enoxaparin (LOVENOX) injection  40 mg Subcutaneous QHS  . furosemide  40 mg Intravenous BID  . ipratropium-albuterol  3 mL Nebulization TID  . methylPREDNISolone (SOLU-MEDROL) injection  60 mg Intravenous Q8H  . mometasone-formoterol  2 puff Inhalation BID  . nicotine  7 mg Transdermal Daily  . potassium chloride  20 mEq Oral BID  . sodium chloride flush  3 mL Intravenous Q12H  . temazepam  30 mg Oral QHS  . traZODone  100 mg Oral QHS   Continuous Infusions: . ampicillin-sulbactam (UNASYN) IV 3 g (11/21/18 1419)  . milrinone 0.25 mcg/kg/min (11/21/18 1400)  . phenylephrine (NEO-SYNEPHRINE) Adult infusion 10 mcg/min (11/21/18 1400)   PRN Meds: albuterol, ipratropium-albuterol   Vital Signs    Vitals:   11/21/18 1200 11/21/18 1300 11/21/18 1400 11/21/18 1408  BP: (!) 100/54 115/69 (!) 103/54   Pulse: 88 100 (!) 101   Resp: 18 (!) 28 (!) 29   Temp: (!) 97.5 F (36.4 C)     TempSrc: Axillary     SpO2: 97% 92% 94% 94%  Weight:      Height:        Intake/Output Summary (Last 24 hours) at 11/21/2018 1455 Last data filed at 11/21/2018 1400 Gross per 24 hour  Intake 1259.99 ml  Output 5175 ml  Net -3915.01 ml   Last 3 Weights 11/20/2018 11/19/2018 11/19/2018  Weight (lbs) 180 lb 8.9 oz 181 lb 3.5 oz 178 lb 9.2 oz  Weight (kg) 81.9 kg 82.2 kg 81 kg      Telemetry    SR 80s-high 90s-  Personally Reviewed  ECG    No new tracings today - Personally Reviewed  Physical Exam   GEN: No acute distress. Somewhat somnolent  Neck: +JVD elevated to angle of madible Cardiac: RRR, no murmurs, rubs, or gallops.  Respiratory: On . Bibasilar crackles. Bilateral rhonchi, cleared with cough. Bilateral wheezing. GI: Soft, nontender, non-distended  MS: bilateral mild to moderate / 1+ pretibial edema; No deformity. Neuro:  Nonfocal  Psych: Normal affect, somewhat agitated due to having just woken   Labs    High Sensitivity Troponin:   Recent Labs  Lab 11/18/18 1200 11/18/18 1741  TROPONINIHS 45* 42*      Cardiac EnzymesNo results for input(s): TROPONINI in the last 168 hours. No results for input(s): TROPIPOC in the last 168 hours.   Chemistry Recent Labs  Lab 11/19/18 0519 11/20/18 0516 11/20/18 1542 11/20/18 2106  NA 140 142  --  141  K 5.2* 4.2 4.1 3.8  CL 94* 98  --  90*  CO2 35* 37*  --  40*  GLUCOSE 128* 155*  --  189*  BUN 48* 43*  --  39*  CREATININE 1.30* 1.01 0.85 1.01  CALCIUM 8.4* 8.7*  --  9.0  GFRNONAA 57* >60 >60 >60  GFRAA >60 >60 >60 >60  ANIONGAP 11 7  --  11     Hematology Recent Labs  Lab 11/18/18 1200 11/20/18 0516  WBC 8.7 11.3*  RBC 5.67 5.50  HGB 16.1 15.6  HCT 48.4 47.6  MCV 85.4 86.5  MCH 28.4 28.4  MCHC 33.3 32.8  RDW 17.1* 16.9*  PLT 240 251    BNP Recent Labs  Lab 11/18/18 1155  BNP 3,146.0*     DDimer No results for input(s): DDIMER in the last 168 hours.   Radiology    Ct Head Wo Contrast  Result Date: 11/20/2018 CLINICAL DATA:  Altered level of consciousness EXAM: CT HEAD WITHOUT CONTRAST TECHNIQUE: Contiguous axial images were obtained from the base of the skull through the vertex without intravenous contrast. COMPARISON:  08/14/2016 FINDINGS: Brain: No evidence of acute infarction, hemorrhage, hydrocephalus, extra-axial collection or mass lesion/mass effect. Vascular: No hyperdense vessel or unexpected  calcification. Skull: Normal. Negative for fracture or focal lesion. Sinuses/Orbits: No acute finding. Other: None. IMPRESSION: Normal head CT for age Electronically Signed   By: Inez Catalina M.D.   On: 11/20/2018 12:22   Dg Chest Port 1 View  Result Date: 11/20/2018 CLINICAL DATA:  CHF exacerbation EXAM: PORTABLE CHEST 1 VIEW COMPARISON:  11/18/2018 FINDINGS: Cardiac shadow remains mildly enlarged. Mild to moderate right-sided pleural effusion is again identified. Some interval clearing in the right base is noted. Mild central vascular congestion is seen. Tiny left pleural effusion is noted as well. Previously seen left upper lobe nodule is again identified but somewhat obscured by overlying artifact. IMPRESSION: Bilateral pleural effusions right greater than left. Interval improved aeration is noted in the right base. Electronically Signed   By: Inez Catalina M.D.   On: 11/20/2018 05:48    Cardiac Studies   [facility] 11/18/2018  Coronary angiography:   Right heart pressures RA mean 20 RV 40/10 mean 22 PA 40/24 mean 30 Wedge pressure mean 16 Cardiac output 3.69 Cardiac index 1.93 Nonobstructive coronary disease Mildly elevated right heart pressures Severely reduced LV function Echocardiogram estimate less than 20%    Echo 11/18/2018  1. The left ventricle has severely reduced systolic function, with an ejection fraction of <20%. The cavity size was moderately dilated. Left ventricular diastolic Doppler parameters are indeterminate. Left ventricular diffuse hypokinesis.  2. The right ventricle has mildly reduced systolic function. The cavity was mildly enlarged. There is no increase in right ventricular wall thickness. Right ventricular systolic pressure is mildly elevated with an estimated pressure of 36.1 mmHg.  3. Left atrial size was mildly dilated.  4. Mitral valve regurgitation is moderate  Patient Profile     63 y.o. male with a h/o squamous cell carcinoma of the nasopharynx, lung  carcinoma, prior smoking history (40 pk yr), prior alcohol abuse, and COPD admitted with acute systolic CHF.  Assessment & Plan    A/P: Dilated cardiomyopathy, nonischemic Cardiac catheterization, no obstructive disease, EF <20% Milrinone started post procedure for low output failure, hypotension Tolerating carvedilol 3.125 mg twice daily though unable to advance, Lasix IV twice daily Would continue milrinone -long etoh hx, patient not forthcoming about whether he has been drinking recently and wife is not aware but does not follow him closely  Acute renal failure cardiorenal syndrome Numbers are stable, with diuresis  Lung carcinoma Previously treated with radiation  COPD 40.00 pack-year smoking history Stopped 63 yo Severe underlying COPD  ETOH,  stopped per the notes and patient, in 04/2009? Unclear  Confusion mental status poor at baseline, likely secondary to  long drinking history Wife reports he is not far from his baseline on visit today, possibly little bit worse If he has been drinking, unable to exclude withdrawal May need CIWA protocol  Extensive discussion with wife at the bedside concerning his baseline mental status, condition of his cardiomyopathy, COPD and severe COPD Case discussed with nursing and with intensivist  Total encounter time more than 45 minutes  Greater than 50% was spent in counseling and coordination of care with the patient     Signed, Ida Rogue, MD  11/21/2018, 2:55 PM

## 2018-11-21 NOTE — Consult Note (Addendum)
Pharmacy Antibiotic Note  Jason Bryan is a 63 y.o. male admitted on 11/18/2018 with Respiratory Failure.   Pharmacy has been consulted for Unasyn dosing for aspiration PNA.  Plan: Start Unasyn 3g IV every 6 hours   Height: 5\' 6"  (167.6 cm) Weight: 180 lb 8.9 oz (81.9 kg) IBW/kg (Calculated) : 63.8  Temp (24hrs), Avg:97.8 F (36.6 C), Min:97.5 F (36.4 C), Max:98.2 F (36.8 C)  Recent Labs  Lab 11/18/18 1200 11/19/18 0519 11/20/18 0516 11/20/18 1542 11/20/18 2106  WBC 8.7  --  11.3*  --   --   CREATININE 1.17 1.30* 1.01 0.85 1.01    Estimated Creatinine Clearance: 73.2 mL/min (by C-G formula based on SCr of 1.01 mg/dL).    No Known Allergies  Antimicrobials this admission: 9/2 Azithromycin >> 9/6 9/5 Unasyn  >>   Microbiology results: 9/3 MRSA PCR: negative   Thank you for allowing pharmacy to be a part of this patient's care.  Pernell Dupre, PharmD, BCPS Clinical Pharmacist 11/21/2018 1:31 PM

## 2018-11-21 NOTE — Progress Notes (Signed)
CRITICAL CARE NOTE  CC  follow up respiratory failure  SUBJECTIVE Patient remains critically ill Prognosis is guarded Alert and awake    BP 127/77   Pulse 89   Temp 98.2 F (36.8 C) (Oral)   Resp 20   Ht 5\' 6"  (1.676 m)   Wt 81.9 kg   SpO2 (!) 85%   BMI 29.14 kg/m    I/O last 3 completed shifts: In: 1362.8 [P.O.:1220; I.V.:142.8] Out: 4300 [Urine:4300] No intake/output data recorded.  SpO2: (!) 85 % O2 Flow Rate (L/min): 6 L/min FiO2 (%): 45 %   SIGNIFICANT EVENTS 9/3 admission to ICU for milrinone infusion End stage CHF  REVIEW OF SYSTEMS  PATIENT IS UNABLE TO PROVIDE COMPLETE REVIEW OF SYSTEMS DUE TO SEVERE CRITICAL ILLNESS   PHYSICAL EXAMINATION:  GENERAL:critically ill appearing, +resp distress HEAD: Normocephalic, atraumatic.  EYES: Pupils equal, round, reactive to light.  No scleral icterus.  MOUTH: Moist mucosal membrane. NECK: Supple.  PULMONARY: +rhonchi CARDIOVASCULAR: S1 and S2. Regular rate and rhythm. No murmurs, rubs, or gallops.  GASTROINTESTINAL: Soft, nontender, -distended. No masses. Positive bowel sounds. No hepatosplenomegaly.  MUSCULOSKELETAL: + edema.  NEUROLOGIC: alert and awake SKIN:intact,warm,dry  MEDICATIONS: I have reviewed all medications and confirmed regimen as documented   CULTURE RESULTS   Recent Results (from the past 240 hour(s))  SARS Coronavirus 2 [facility] order, Performed in [facility] hospital lab) Nasopharyngeal Nasopharyngeal Swab     Status: None   Collection Time: 11/18/18 12:00 PM   Specimen: Nasopharyngeal Swab  Result Value Ref Range Status   SARS Coronavirus 2 NEGATIVE NEGATIVE Final    Comment: (NOTE) If result is NEGATIVE SARS-CoV-2 target nucleic acids are NOT DETECTED. The SARS-CoV-2 RNA is generally detectable in upper and lower  respiratory specimens during the acute phase of infection. The lowest  concentration of SARS-CoV-2 viral copies this assay can detect is 250  copies / mL. A  negative result does not preclude SARS-CoV-2 infection  and should not be used as the sole basis for treatment or other  patient management decisions.  A negative result may occur with  improper specimen collection / handling, submission of specimen other  than nasopharyngeal swab, presence of viral mutation(s) within the  areas targeted by this assay, and inadequate number of viral copies  (<250 copies / mL). A negative result must be combined with clinical  observations, patient history, and epidemiological information. If result is POSITIVE SARS-CoV-2 target nucleic acids are DETECTED. The SARS-CoV-2 RNA is generally detectable in upper and lower  respiratory specimens dur ing the acute phase of infection.  Positive  results are indicative of active infection with SARS-CoV-2.  Clinical  correlation with patient history and other diagnostic information is  necessary to determine patient infection status.  Positive results do  not rule out bacterial infection or co-infection with other viruses. If result is PRESUMPTIVE POSTIVE SARS-CoV-2 nucleic acids MAY BE PRESENT.   A presumptive positive result was obtained on the submitted specimen  and confirmed on repeat testing.  While 2019 novel coronavirus  (SARS-CoV-2) nucleic acids may be present in the submitted sample  additional confirmatory testing may be necessary for epidemiological  and / or clinical management purposes  to differentiate between  SARS-CoV-2 and other Sarbecovirus currently known to infect humans.  If clinically indicated additional testing with an alternate test  methodology [phone]) is advised. The SARS-CoV-2 RNA is generally  detectable in upper and lower respiratory sp ecimens during the acute  phase of infection. The  expected result is Negative. Fact Sheet for Patients:  StrictlyIdeas.no Fact Sheet for Healthcare Providers: BankingDealers.co.za This test is not  yet approved or cleared by the [place] FDA and has been authorized for detection and/or diagnosis of SARS-CoV-2 by FDA under an Emergency Use Authorization (EUA).  This EUA will remain in effect (meaning this test can be used) for the duration of the COVID-19 declaration under Section 564(b)(1) of the Act, 21 U.S.C. section 360bbb-3(b)(1), unless the authorization is terminated or revoked sooner. Performed at [facility], [address] [place], [place] [address]   MRSA PCR Screening     Status: None   Collection Time: 11/19/18  8:06 PM   Specimen: Nasal Mucosa; Nasopharyngeal  Result Value Ref Range Status   MRSA by PCR NEGATIVE NEGATIVE Final    Comment:        The GeneXpert MRSA Assay (FDA approved for NASAL specimens only), is one component of a comprehensive MRSA colonization surveillance program. It is not intended to diagnose MRSA infection nor to guide or monitor treatment for MRSA infections. Performed at [facility], [place], [place] [address]           IMAGING    Ct Head Wo Contrast  Result Date: 11/20/2018 CLINICAL DATA:  Altered level of consciousness EXAM: CT HEAD WITHOUT CONTRAST TECHNIQUE: Contiguous axial images were obtained from the base of the skull through the vertex without intravenous contrast. COMPARISON:  08/14/2016 FINDINGS: Brain: No evidence of acute infarction, hemorrhage, hydrocephalus, extra-axial collection or mass lesion/mass effect. Vascular: No hyperdense vessel or unexpected calcification. Skull: Normal. Negative for fracture or focal lesion. Sinuses/Orbits: No acute finding. Other: None. IMPRESSION: Normal head CT for age Electronically Signed   By: Inez Catalina M.D.   On: 11/20/2018 12:22    CBC    Component Value Date/Time   WBC 11.3 (H) 11/20/2018 0516   RBC 5.50 11/20/2018 0516   HGB 15.6 11/20/2018 0516   HGB 12.6 (L) 12/21/2013 1111   HCT 47.6 11/20/2018 0516   HCT 38.9 (L)  12/21/2013 1111   PLT 251 11/20/2018 0516   PLT 252 12/21/2013 1111   MCV 86.5 11/20/2018 0516   MCV 99 12/21/2013 1111   MCH 28.4 11/20/2018 0516   MCHC 32.8 11/20/2018 0516   RDW 16.9 (H) 11/20/2018 0516   RDW 17.8 (H) 12/21/2013 1111   LYMPHSABS 0.6 (L) 11/18/2018 1200   LYMPHSABS 0.6 (L) 12/21/2013 1111   MONOABS 0.7 11/18/2018 1200   MONOABS 0.6 12/21/2013 1111   EOSABS 0.1 11/18/2018 1200   EOSABS 0.1 12/21/2013 1111   BASOSABS 0.0 11/18/2018 1200   BASOSABS 0.0 12/21/2013 1111   BMP Latest Ref Rng & Units 11/20/2018 11/20/2018 11/20/2018  Glucose 70 - 99 mg/dL 189(H) - 155(H)  BUN 8 - 23 mg/dL 39(H) - 43(H)  Creatinine 0.61 - 1.24 mg/dL 1.01 0.85 1.01  Sodium 135 - 145 mmol/L 141 - 142  Potassium 3.5 - 5.1 mmol/L 3.8 4.1 4.2  Chloride 98 - 111 mmol/L 90(L) - 98  CO2 22 - 32 mmol/L 40(H) - 37(H)  Calcium 8.9 - 10.3 mg/dL 9.0 - 8.7(L)           ASSESSMENT AND PLAN SYNOPSIS  Acute hypoxic respiratory failure secondary to acute new onset systolic CHF exacerbation,moderate size right pleural effusion,AECOPD &Underlying lung cancer Hx: Current everyday smoker andStage IIB Poorly Differrentiated Lung Cancer -Supplemental O2 for dyspnea and/or hypoxia -continue milrinone gtt and iv lasix -Scheduled and prn  bronchodilator therapy -Continue azithromycin -Follow with Oncology as outpatient (sees Dr. Donella Stade) -Palliative Care following, appreciate input High risk for intubation,cardiac arrest  Cardiogenic shock secondary to Acute Decompensated HFrEE (EF <70%) complicated by Afib with RVR -Cardiology following, appreciate input -Milrinone gtt as per Cardiology, with plan to start IV Lasix on 9/4 as BP and renal function permit -BB, ACE,ARB, Entresto, Spironolactone are deferred for now due to hypotension -amiodarone infusion   Cardiorenal Syndrome ACUTE KIDNEY INJURY/Renal Failure -follow chem 7 -follow UO -continue Foley Catheter-assess need -Avoid  nephrotoxic agents  ENDO - will use ICU hypoglycemic\Hyperglycemia protocol if indicated   ELECTROLYTES -follow labs as needed -replace as needed -pharmacy consultation and following   DVT/GI PRX ordered TRANSFUSIONS AS NEEDED MONITOR FSBS ASSESS the need for LABS as needed   Critical Care Time devoted to patient care services described in this note is 32 minutes.   Overall, patient is critically ill, prognosis is guarded.  Patient with Multiorgan failure and at high risk for cardiac arrest and death.    Corrin Parker, M.D.  [facility]  Medical Director [place] Director [facility] Cardio-Pulmonary Department

## 2018-11-21 NOTE — Progress Notes (Signed)
[place] at [place] NAME: Jason Bryan    MR#:  [id number]  DATE OF BIRTH:  [birth year]  SUBJECTIVE:   Patient still having some severe generalized weakness today.  He is sleepy, but easily arousable.  No significant events overnight.  REVIEW OF SYSTEMS:  Review of Systems  Constitutional: Negative for chills, fever and malaise/fatigue.  HENT: Negative for congestion and sore throat.   Respiratory: Positive for cough, shortness of breath and wheezing.   Cardiovascular: Positive for leg swelling. Negative for chest pain and palpitations.  Gastrointestinal: Negative for abdominal pain, constipation, diarrhea, nausea and vomiting.  Neurological: Negative for focal weakness, loss of consciousness and weakness.   DRUG ALLERGIES:  No Known Allergies VITALS:  Blood pressure (!) 103/54, pulse (!) 101, temperature (!) 97.5 F (36.4 C), temperature source Axillary, resp. rate (!) 29, height 5\' 6"  (1.676 m), weight 81.9 kg, SpO2 94 %. PHYSICAL EXAMINATION:  Physical Exam  GENERAL:   Sleepy but easily arousable.  In no acute distress. HEENT: Head atraumatic, normocephalic. Pupils equal, round, reactive to light and accommodation. No scleral icterus. Extraocular muscles intact. Oropharynx and nasopharynx clear. +JVD NECK:  Supple, no jugular venous distention. No thyroid enlargement. LUNGS: + Diminished breath sounds in the lung bases bilaterally. No wheezes, crackles, rhonchi. No use of accessory muscles of respiration.  CARDIOVASCULAR: Mildly tachycardic, regular rhythm, S1, S2 normal. No murmurs, rubs, or gallops.  ABDOMEN: Soft, nontender, nondistended. Bowel sounds present.  EXTREMITIES: No cyanosis, or clubbing. 1+ bilateral pitting edema. NEUROLOGIC: CN 2-12 intact, no focal deficits. + Global weakness. Sensation intact throughout. Gait not checked.  PSYCHIATRIC: The patient is alert and oriented x 3.  SKIN: No obvious rash, lesion, or ulcer.    LABORATORY PANEL:  Male CBC Recent Labs  Lab 11/20/18 0516  WBC 11.3*  HGB 15.6  HCT 47.6  PLT 251   ------------------------------------------------------------------------------------------------------------------ Chemistries  Recent Labs  Lab 11/20/18 2106  NA 141  K 3.8  CL 90*  CO2 40*  GLUCOSE 189*  BUN 39*  CREATININE 1.01  CALCIUM 9.0  MG 2.0   RADIOLOGY:  No results found.  CXR FINDINGS: There is a new, moderate right pleural effusion with associated atelectasis or consolidation. There is mild, diffuse interstitial opacity elsewhere throughout. There are nodules of the bilateral upper lobes, not significantly changed compared to prior examination. Cardiomegaly.  IMPRESSION: 1. There is a new, moderate right pleural effusion with associated atelectasis or consolidation.  2. There is mild, diffuse interstitial opacity elsewhere throughout, consistent with edema.  3. There are nodules of the bilateral upper lobes, not significantly changed compared to prior examination and in keeping with known lung malignancy. ASSESSMENT AND PLAN:   Acute hypoxic respiratory failure secondary to acute systolic congestive heart failure and acute COPD exacerbation- weaned down to 2L O2 this morning. -Wean O2 as able -Duonebs prn  Acute systolic congestive heart failure with non-ischemic cardiomyopathy- may be due to long history of alcohol use. -ECHO with EF <20%. -S/p cardiac catheterization 9/3 with nonobstructive coronary artery disease -Cardiology following -Continue Lasix 40 mg IV daily -Continue IV milrinone -Continue Coreg 3.125 mg twice daily -Palliative care following  Acute COPD exacerbation -Continue IV Solu-Medrol -Continue azithromycin -Continue home inhalers -Duonebs PRN  Cardiogenic shock- due to heart failure -Currently on pressors   Leukocytosis- likely due to steroids. No signs of infection. -Monitor  Lung cancer- follows with Dr.  Donella Stade for radiation therapy. -Follow-up with oncology outpatient  Tobacco use -Continue nicotine patch  All the records are reviewed and case discussed with Care Management/Social Worker. Management plans discussed with the patient, family and they are in agreement.  CODE STATUS: Full  TOTAL TIME TAKING CARE OF THIS PATIENT: 40 minutes.   More than 50% of the time was spent in counseling/coordination of care: YES  POSSIBLE D/C unknown, DEPENDING ON CLINICAL CONDITION.   Berna Spare Akyah Lagrange M.D on 11/21/2018 at 4:29 PM  Between 7am to 6pm - Pager - [phone]  After 6pm go to [email or web address] - Proofreader  [facility]  Office  [phone]  CC: Primary care physician; Cletis Athens, MD  Note: This dictation was prepared with Dragon dictation along with smaller phrase technology. Any transcriptional errors that result from this process are unintentional.

## 2018-11-22 DIAGNOSIS — J431 Panlobular emphysema: Secondary | ICD-10-CM

## 2018-11-22 DIAGNOSIS — I25118 Atherosclerotic heart disease of native coronary artery with other forms of angina pectoris: Secondary | ICD-10-CM

## 2018-11-22 DIAGNOSIS — Z952 Presence of prosthetic heart valve: Secondary | ICD-10-CM

## 2018-11-22 LAB — BASIC METABOLIC PANEL
Anion gap: 9 (ref 5–15)
BUN: 29 mg/dL — ABNORMAL HIGH (ref 8–23)
CO2: 44 mmol/L — ABNORMAL HIGH (ref 22–32)
Calcium: 8.4 mg/dL — ABNORMAL LOW (ref 8.9–10.3)
Chloride: 87 mmol/L — ABNORMAL LOW (ref 98–111)
Creatinine, Ser: 0.69 mg/dL (ref 0.61–1.24)
GFR calc Af Amer: >60 mL/min (ref >60)
GFR calc non Af Amer: >60 mL/min (ref >60)
Glucose, Bld: 153 mg/dL — ABNORMAL HIGH (ref 70–99)
Potassium: 4 mmol/L (ref 3.5–5.1)
Sodium: 140 mmol/L (ref 135–145)

## 2018-11-22 LAB — CBC WITH DIFFERENTIAL/PLATELET
Abs Immature Granulocytes: 0.07 10*3/uL (ref 0.00–0.07)
Basophils Absolute: 0 10*3/uL (ref 0.0–0.1)
Basophils Relative: 0 %
Eosinophils Absolute: 0 10*3/uL (ref 0.0–0.5)
Eosinophils Relative: 0 %
HCT: 44.7 % (ref 39.0–52.0)
Hemoglobin: 15 g/dL (ref 13.0–17.0)
Immature Granulocytes: 1 %
Lymphocytes Relative: 2 %
Lymphs Abs: 0.2 10*3/uL — ABNORMAL LOW (ref 0.7–4.0)
MCH: 28.4 pg (ref 26.0–34.0)
MCHC: 33.6 g/dL (ref 30.0–36.0)
MCV: 84.5 fL (ref 80.0–100.0)
Monocytes Absolute: 0.3 10*3/uL (ref 0.1–1.0)
Monocytes Relative: 3 %
Neutro Abs: 8.4 10*3/uL — ABNORMAL HIGH (ref 1.7–7.7)
Neutrophils Relative %: 94 %
Platelets: 182 10*3/uL (ref 150–400)
RBC: 5.29 MIL/uL (ref 4.22–5.81)
RDW: 16.5 % — ABNORMAL HIGH (ref 11.5–15.5)
WBC: 8.9 10*3/uL (ref 4.0–10.5)
nRBC: 0 % (ref 0.0–0.2)

## 2018-11-22 LAB — CARBOXYHEMOGLOBIN - COOX: Carboxyhemoglobin: 2.3 % — ABNORMAL HIGH (ref 0.5–1.5)

## 2018-11-22 NOTE — Progress Notes (Signed)
[place] at [place] NAME: Jason Bryan    MR#:  [id number]  DATE OF BIRTH:  October 21, 1955  SUBJECTIVE:   Patient states he is feeling better this morning. His shortness of breath is improved. Lower extremity edema also improving.  REVIEW OF SYSTEMS:  Review of Systems  Constitutional: Negative for chills, fever and malaise/fatigue.  HENT: Negative for congestion and sore throat.   Respiratory: Positive for cough and shortness of breath. Negative for wheezing.   Cardiovascular: Positive for leg swelling. Negative for chest pain and palpitations.  Gastrointestinal: Negative for abdominal pain, constipation, diarrhea, nausea and vomiting.  Genitourinary: Negative for dysuria and urgency.  Musculoskeletal: Negative for back pain and neck pain.  Neurological: Negative for focal weakness, loss of consciousness and weakness.  Psychiatric/Behavioral: Negative for depression. The patient is not nervous/anxious.    DRUG ALLERGIES:  No Known Allergies VITALS:  Blood pressure (!) 107/57, pulse 93, temperature 97.7 F (36.5 C), temperature source Axillary, resp. rate 17, height 5\' 6"  (1.676 m), weight 80.7 kg, SpO2 (!) 89 %. PHYSICAL EXAMINATION:  Physical Exam  GENERAL:   Sleepy but easily arousable.  In no acute distress. HEENT: Head atraumatic, normocephalic. Pupils equal, round, reactive to light and accommodation. No scleral icterus. Extraocular muscles intact. Oropharynx and nasopharynx clear. +JVD NECK:  Supple, no jugular venous distention. No thyroid enlargement. LUNGS: + Diminished breath sounds in the lung bases bilaterally. No wheezes, crackles, rhonchi. No use of accessory muscles of respiration.  CARDIOVASCULAR: RRR, S1, S2 normal. No murmurs, rubs, or gallops.  ABDOMEN: Soft, nontender, nondistended. Bowel sounds present.  EXTREMITIES: No cyanosis, or clubbing. Trace bilateral pitting edema. NEUROLOGIC: CN 2-12 intact, no focal  deficits. + Global weakness. Sensation intact throughout. Gait not checked.  PSYCHIATRIC: The patient is alert and oriented x 3.  SKIN: No obvious rash, lesion, or ulcer.   LABORATORY PANEL:  Male CBC Recent Labs  Lab 11/22/18 0706  WBC 8.9  HGB 15.0  HCT 44.7  PLT 182   ------------------------------------------------------------------------------------------------------------------ Chemistries  Recent Labs  Lab 11/20/18 2106 11/22/18 0706  NA 141 140  K 3.8 4.0  CL 90* 87*  CO2 40* 44*  GLUCOSE 189* 153*  BUN 39* 29*  CREATININE 1.01 0.69  CALCIUM 9.0 8.4*  MG 2.0  --    RADIOLOGY:  No results found.  CXR FINDINGS: There is a new, moderate right pleural effusion with associated atelectasis or consolidation. There is mild, diffuse interstitial opacity elsewhere throughout. There are nodules of the bilateral upper lobes, not significantly changed compared to prior examination. Cardiomegaly.  IMPRESSION: 1. There is a new, moderate right pleural effusion with associated atelectasis or consolidation.  2. There is mild, diffuse interstitial opacity elsewhere throughout, consistent with edema.  3. There are nodules of the bilateral upper lobes, not significantly changed compared to prior examination and in keeping with known lung malignancy. ASSESSMENT AND PLAN:   Acute hypoxic respiratory failure secondary to acute systolic congestive heart failure and acute COPD exacerbation- weaned down to 2L O2 this morning. -Wean O2 as able -Duonebs prn  Acute systolic congestive heart failure with non-ischemic cardiomyopathy- may be due to long history of alcohol use. -ECHO with EF <20%. -S/p cardiac catheterization 9/3 with nonobstructive coronary artery disease -Cardiology following -Continue Lasix 40 mg IV daily as able -Continue IV milrinone -Continue Coreg 3.125 mg twice daily as able -Palliative care following  Acute COPD exacerbation -Continue IV  Solu-Medrol -Continue azithromycin -Continue home  inhalers -Duonebs PRN  Possible aspiration pneumonia -Started on unasyn  Cardiogenic shock- due to heart failure -Off pressors since yesterday  Lung cancer- follows with Dr. Donella Stade for radiation therapy. -Follow-up with oncology outpatient  Tobacco use -Continue nicotine patch  All the records are reviewed and case discussed with Care Management/Social Worker. Management plans discussed with the patient, family and they are in agreement.  CODE STATUS: Full  TOTAL TIME TAKING CARE OF THIS PATIENT: 35 minutes.   More than 50% of the time was spent in counseling/coordination of care: YES  POSSIBLE D/C unknown, DEPENDING ON CLINICAL CONDITION.   Berna Spare  M.D on 11/22/2018 at 1:57 PM  Between 7am to 6pm - Pager - [phone]  After 6pm go to [email or web address] - Proofreader  [facility] [place] [facility]  Office  [phone]  CC: Primary care physician; Cletis Athens, MD  Note: This dictation was prepared with Dragon dictation along with smaller phrase technology. Any transcriptional errors that result from this process are unintentional.

## 2018-11-22 NOTE — Progress Notes (Signed)
CRITICAL CARE NOTE  CC  follow up respiratory failure Follow up end stage CHF  SUBJECTIVE Patient remains critically ill Prognosis is guarded Alert and awake Remains on mirenone EF <20%   BP 133/74   Pulse 93   Temp 98 F (36.7 C) (Axillary)   Resp 16   Ht 5\' 6"  (1.676 m)   Wt 80.7 kg   SpO2 100%   BMI 28.72 kg/m    I/O last 3 completed shifts: In: 2038.3 [P.O.:1140; I.V.:598.3; IV Piggyback:300] Out: 6075 [Urine:6075] Total I/O In: 99.5 [I.V.:6; IV Piggyback:93.4] Out: 250 [Urine:250]  SpO2: 100 % O2 Flow Rate (L/min): 2 L/min FiO2 (%): 32 %   SIGNIFICANT EVENTS 9/3 admission to ICU for milrinone infusion End stage CHF 9/5 remains critically ill, SOB, +aspirarion pneumonia  REVIEW OF SYSTEMS  PATIENT IS UNABLE TO PROVIDE COMPLETE REVIEW OF SYSTEMS DUE TO SEVERE CRITICAL ILLNESS   PHYSICAL EXAMINATION:  GENERAL:critically ill appearing, +resp distress HEAD: Normocephalic, atraumatic.  EYES: Pupils equal, round, reactive to light.  No scleral icterus.  MOUTH: Moist mucosal membrane. NECK: Supple.  PULMONARY: +rhonchi, +wheezing CARDIOVASCULAR: S1 and S2. Regular rate and rhythm. No murmurs, rubs, or gallops.  GASTROINTESTINAL: Soft, nontender, -distended.  MUSCULOSKELETAL: + edema.  NEUROLOGIC: alert and awake SKIN:intact,warm,dry  MEDICATIONS: I have reviewed all medications and confirmed regimen as documented   CULTURE RESULTS   Recent Results (from the past 240 hour(s))  SARS Coronavirus 2 [facility] order, Performed in [facility] hospital lab) Nasopharyngeal Nasopharyngeal Swab     Status: None   Collection Time: 11/18/18 12:00 PM   Specimen: Nasopharyngeal Swab  Result Value Ref Range Status   SARS Coronavirus 2 NEGATIVE NEGATIVE Final    Comment: (NOTE) If result is NEGATIVE SARS-CoV-2 target nucleic acids are NOT DETECTED. The SARS-CoV-2 RNA is generally detectable in upper and lower  respiratory specimens during the acute phase of  infection. The lowest  concentration of SARS-CoV-2 viral copies this assay can detect is 250  copies / mL. A negative result does not preclude SARS-CoV-2 infection  and should not be used as the sole basis for treatment or other  patient management decisions.  A negative result may occur with  improper specimen collection / handling, submission of specimen other  than nasopharyngeal swab, presence of viral mutation(s) within the  areas targeted by this assay, and inadequate number of viral copies  (<250 copies / mL). A negative result must be combined with clinical  observations, patient history, and epidemiological information. If result is POSITIVE SARS-CoV-2 target nucleic acids are DETECTED. The SARS-CoV-2 RNA is generally detectable in upper and lower  respiratory specimens dur ing the acute phase of infection.  Positive  results are indicative of active infection with SARS-CoV-2.  Clinical  correlation with patient history and other diagnostic information is  necessary to determine patient infection status.  Positive results do  not rule out bacterial infection or co-infection with other viruses. If result is PRESUMPTIVE POSTIVE SARS-CoV-2 nucleic acids MAY BE PRESENT.   A presumptive positive result was obtained on the submitted specimen  and confirmed on repeat testing.  While 2019 novel coronavirus  (SARS-CoV-2) nucleic acids may be present in the submitted sample  additional confirmatory testing may be necessary for epidemiological  and / or clinical management purposes  to differentiate between  SARS-CoV-2 and other Sarbecovirus currently known to infect humans.  If clinically indicated additional testing with an alternate test  methodology [phone]) is advised. The SARS-CoV-2 RNA is generally  detectable in upper and lower respiratory sp ecimens during the acute  phase of infection. The expected result is Negative. Fact Sheet for Patients:   StrictlyIdeas.no Fact Sheet for Healthcare Providers: BankingDealers.co.za This test is not yet approved or cleared by the [place] FDA and has been authorized for detection and/or diagnosis of SARS-CoV-2 by FDA under an Emergency Use Authorization (EUA).  This EUA will remain in effect (meaning this test can be used) for the duration of the COVID-19 declaration under Section 564(b)(1) of the Act, 21 U.S.C. section 360bbb-3(b)(1), unless the authorization is terminated or revoked sooner. Performed at [facility], [address] [place], [place] [address]   MRSA PCR Screening     Status: None   Collection Time: 11/19/18  8:06 PM   Specimen: Nasal Mucosa; Nasopharyngeal  Result Value Ref Range Status   MRSA by PCR NEGATIVE NEGATIVE Final    Comment:        The GeneXpert MRSA Assay (FDA approved for NASAL specimens only), is one component of a comprehensive MRSA colonization surveillance program. It is not intended to diagnose MRSA infection nor to guide or monitor treatment for MRSA infections. Performed at [facility], [address] [place], [place] [address]          CBC    Component Value Date/Time   WBC 8.9 11/22/2018 0706   RBC 5.29 11/22/2018 0706   HGB 15.0 11/22/2018 0706   HGB 12.6 (L) 12/21/2013 1111   HCT 44.7 11/22/2018 0706   HCT 38.9 (L) 12/21/2013 1111   PLT 182 11/22/2018 0706   PLT 252 12/21/2013 1111   MCV 84.5 11/22/2018 0706   MCV 99 12/21/2013 1111   MCH 28.4 11/22/2018 0706   MCHC 33.6 11/22/2018 0706   RDW 16.5 (H) 11/22/2018 0706   RDW 17.8 (H) 12/21/2013 1111   LYMPHSABS 0.2 (L) 11/22/2018 0706   LYMPHSABS 0.6 (L) 12/21/2013 1111   MONOABS 0.3 11/22/2018 0706   MONOABS 0.6 12/21/2013 1111   EOSABS 0.0 11/22/2018 0706   EOSABS 0.1 12/21/2013 1111   BASOSABS 0.0 11/22/2018 0706   BASOSABS 0.0 12/21/2013 1111   BMP Latest Ref Rng & Units 11/22/2018 11/20/2018  11/20/2018  Glucose 70 - 99 mg/dL 153(H) 189(H) -  BUN 8 - 23 mg/dL 29(H) 39(H) -  Creatinine 0.61 - 1.24 mg/dL 0.69 1.01 0.85  Sodium 135 - 145 mmol/L 140 141 -  Potassium 3.5 - 5.1 mmol/L 4.0 3.8 4.1  Chloride 98 - 111 mmol/L 87(L) 90(L) -  CO2 22 - 32 mmol/L 44(H) 40(H) -  Calcium 8.9 - 10.3 mg/dL 8.4(L) 9.0 -        ASSESSMENT AND PLAN SYNOPSIS  Acute hypoxic respiratory failure secondary to acute new onset systolic CHF exacerbation,moderate size right pleural effusion,AECOPD &Underlying lung cancer  SEVERE COPD EXACERBATION -continue IV steroids as prescribed -continue NEB THERAPY as prescribed -morphine as needed -wean fio2 as needed and tolerated   ACUTE SYSTOLIC CARDIAC FAILURE- EF <20% -oxygen as needed -Lasix as tolerated -follow up cardiac enzymes as indicated -follow up cardiology recs Continue milrinone infusion   SHOCK-CARDIOGENIC -use vasopressors to keep MAP>65 -follow ABG and LA  CARDIAC ICU monitoring  ID -continue IV abx as prescibed -follow up cultures For aspiration pneumonia  GI GI PROPHYLAXIS as indicated  NUTRITIONAL STATUS DIET--> as tolerated Constipation protocol as indicated  ENDO - will use ICU hypoglycemic\Hyperglycemia protocol if indicated   ELECTROLYTES -follow labs as needed -replace as needed -pharmacy consultation and  following   DVT/GI PRX ordered TRANSFUSIONS AS NEEDED MONITOR FSBS ASSESS the need for LABS as needed   Critical Care Time devoted to patient care services described in this note is 34 minutes.   Overall, patient is critically ill, prognosis is guarded.   Remains Full Code   Corrin Parker, M.D.  [facility]  Medical Director [place] Director [facility] Cardio-Pulmonary Department

## 2018-11-22 NOTE — Progress Notes (Signed)
Progress Note  Patient Name: Jason Bryan Date of Encounter: 11/22/2018  Primary Cardiologist: [facility], Dr. Rockey Situ  Subjective   No significant events overnight No significant arrhythmia on telemetry Now off pressors, blood pressure running 097 systolic, Received carvedilol 3.125 mg this morning Still on milrinone infusion Notes indicating -6.5 L total.  -1.5 L or more in the past 24 hours Reports shortness of breath is improving Nurses report he is less confused today  Inpatient Medications    Scheduled Meds: . aspirin EC  81 mg Oral Daily  . azithromycin  250 mg Oral Daily  . carvedilol  3.125 mg Oral BID WC  . Chlorhexidine Gluconate Cloth  6 each Topical Daily  . enoxaparin (LOVENOX) injection  40 mg Subcutaneous QHS  . furosemide  40 mg Intravenous BID  . ipratropium-albuterol  3 mL Nebulization TID  . methylPREDNISolone (SOLU-MEDROL) injection  60 mg Intravenous Q8H  . mometasone-formoterol  2 puff Inhalation BID  . nicotine  7 mg Transdermal Daily  . potassium chloride  20 mEq Oral BID  . sodium chloride flush  3 mL Intravenous Q12H  . temazepam  30 mg Oral QHS  . traZODone  100 mg Oral QHS   Continuous Infusions: . ampicillin-sulbactam (UNASYN) IV 3 g (11/22/18 1221)  . milrinone 0.25 mcg/kg/min (11/22/18 1321)  . phenylephrine (NEO-SYNEPHRINE) Adult infusion Stopped (11/21/18 1554)   PRN Meds: albuterol, ipratropium-albuterol   Vital Signs    Vitals:   11/22/18 1030 11/22/18 1100 11/22/18 1200 11/22/18 1457  BP: 111/62 (!) 103/58 (!) 107/57   Pulse: 89 86 93   Resp: (!) 28 (!) 22 17   Temp:   97.7 F (36.5 C)   TempSrc:   Axillary   SpO2: 93% 94% (!) 89% 92%  Weight:      Height:        Intake/Output Summary (Last 24 hours) at 11/22/2018 1625 Last data filed at 11/22/2018 1200 Gross per 24 hour  Intake 1140.36 ml  Output 3730 ml  Net -2589.64 ml   Last 3 Weights 11/22/2018 11/20/2018 11/19/2018  Weight (lbs) 177 lb 14.6 oz 180 lb 8.9 oz 181 lb  3.5 oz  Weight (kg) 80.7 kg 81.9 kg 82.2 kg      Telemetry    Normal sinus rhythm- Personally Reviewed  ECG    No new tracings today - Personally Reviewed  Physical Exam   Constitutional: Alert, answers questions appropriately HENT:  Head: Grossly normal Eyes:  no discharge. No scleral icterus.  Neck: No JVD, no carotid bruits  Cardiovascular: Regular rate and rhythm, no murmurs appreciated Pulmonary/Chest: Coarse breath sounds, scattered Rales Abdominal: Soft.  no distension.  no tenderness.  Musculoskeletal: Normal range of motion Neurological:  normal muscle tone. Coordination normal. No atrophy Skin: Skin warm and dry Psychiatric: normal affect, pleasant   Labs    High Sensitivity Troponin:   Recent Labs  Lab 11/18/18 1200 11/18/18 1741  TROPONINIHS 45* 42*      Cardiac EnzymesNo results for input(s): TROPONINI in the last 168 hours. No results for input(s): TROPIPOC in the last 168 hours.   Chemistry Recent Labs  Lab 11/20/18 0516 11/20/18 1542 11/20/18 2106 11/22/18 0706  NA 142  --  141 140  K 4.2 4.1 3.8 4.0  CL 98  --  90* 87*  CO2 37*  --  40* 44*  GLUCOSE 155*  --  189* 153*  BUN 43*  --  39* 29*  CREATININE 1.01 0.85 1.01  0.69  CALCIUM 8.7*  --  9.0 8.4*  GFRNONAA >60 >60 >60 >60  GFRAA >60 >60 >60 >60  ANIONGAP 7  --  11 9     Hematology Recent Labs  Lab 11/18/18 1200 11/20/18 0516 11/22/18 0706  WBC 8.7 11.3* 8.9  RBC 5.67 5.50 5.29  HGB 16.1 15.6 15.0  HCT 48.4 47.6 44.7  MCV 85.4 86.5 84.5  MCH 28.4 28.4 28.4  MCHC 33.3 32.8 33.6  RDW 17.1* 16.9* 16.5*  PLT 240 251 182    BNP Recent Labs  Lab 11/18/18 1155  BNP 3,146.0*     DDimer No results for input(s): DDIMER in the last 168 hours.   Radiology    No results found.  Cardiac Studies   [facility] 11/18/2018  Coronary angiography:   Right heart pressures RA mean 20 RV 40/10 mean 22 PA 40/24 mean 30 Wedge pressure mean 16 Cardiac output 3.69 Cardiac index 1.93  Nonobstructive coronary disease Mildly elevated right heart pressures Severely reduced LV function Echocardiogram estimate less than 20%  Echo 11/18/2018  1. The left ventricle has severely reduced systolic function, with an ejection fraction of <20%. The cavity size was moderately dilated. Left ventricular diastolic Doppler parameters are indeterminate. Left ventricular diffuse hypokinesis.  2. The right ventricle has mildly reduced systolic function. The cavity was mildly enlarged. There is no increase in right ventricular wall thickness. Right ventricular systolic pressure is mildly elevated with an estimated pressure of 36.1 mmHg.  3. Left atrial size was mildly dilated.  4. Mitral valve regurgitation is moderate  Patient Profile     63 y.o. male with a h/o squamous cell carcinoma of the nasopharynx, lung carcinoma, prior smoking history (40 pk yr), prior alcohol abuse, and COPD admitted with acute systolic CHF.  Assessment & Plan    A/P: Dilated cardiomyopathy, nonischemic -long etoh hx, patient not forthcoming about whether he has been drinking recently and wife is not aware but does not follow him closely Cardiac catheterization, no obstructive disease, EF <20% Milrinone started post procedure for low output failure, hypotension Required pressors for blood pressure support, Coreg held yesterday pressors weaned off overnight Tolerating carvedilol today with milrinone infusion Would continue Lasix IV twice daily, milrinone infusion, Coreg Would hope to increase Coreg, add ACE/RB slowly next few days  Acute renal failure cardiorenal syndrome Renal function improving with diuresis Continue milrinone, Lasix IV twice daily  Lung carcinoma Previously treated with radiation Contributing to shortness of breath  COPD 40.00 pack-year smoking history Stopped 63 yo Severe underlying COPD Pulmonary assisting with inhalers  ETOH,  stopped per the notes and patient, in 04/2009?  Unclear if he still drinks  Confusion Poor status at baseline, slight improvement today Choking episode with hypoxia 2 mornings ago, Unable to exclude aspiration  Long discussion with nursing, case discussed with ICU team  Total encounter time more than 35 minutes  Greater than 50% was spent in counseling and coordination of care with the patient      Signed, Ida Rogue, MD  11/22/2018, 4:25 PM

## 2018-11-22 NOTE — Consult Note (Signed)
Pharmacy Antibiotic Note  Jason Bryan is a 63 y.o. male admitted on 11/18/2018 with Respiratory Failure.   Pharmacy has been consulted for Unasyn dosing for aspiration PNA.  Plan: Start Unasyn 3g IV every 6 hours   Height: 5\' 6"  (167.6 cm) Weight: 177 lb 14.6 oz (80.7 kg) IBW/kg (Calculated) : 63.8  Temp (24hrs), Avg:97.9 F (36.6 C), Min:97.5 F (36.4 C), Max:98.1 F (36.7 C)  Recent Labs  Lab 11/18/18 1200 11/19/18 0519 11/20/18 0516 11/20/18 1542 11/20/18 2106 11/22/18 0706  WBC 8.7  --  11.3*  --   --  8.9  CREATININE 1.17 1.30* 1.01 0.85 1.01 0.69    Estimated Creatinine Clearance: 91.9 mL/min (by C-G formula based on SCr of 0.69 mg/dL).    No Known Allergies  Antimicrobials this admission: 9/2 Azithromycin >> 9/6 9/5 Unasyn  >>   Microbiology results: 9/3 MRSA PCR: negative   Thank you for allowing pharmacy to be a part of this patient's care.  Oswald Hillock, PharmD, BCPS Clinical Pharmacist 11/22/2018 11:51 AM

## 2018-11-22 NOTE — Plan of Care (Signed)
Patient continues on Primacor.  No signs and symptoms of distress or discomfort this shift.  Patient had good urine output.  Will continue to monitor.

## 2018-11-23 DIAGNOSIS — G9340 Encephalopathy, unspecified: Secondary | ICD-10-CM

## 2018-11-23 DIAGNOSIS — I420 Dilated cardiomyopathy: Secondary | ICD-10-CM

## 2018-11-23 LAB — BASIC METABOLIC PANEL
Anion gap: 9 (ref 5–15)
BUN: 33 mg/dL — ABNORMAL HIGH (ref 8–23)
CO2: 43 mmol/L — ABNORMAL HIGH (ref 22–32)
Calcium: 8.4 mg/dL — ABNORMAL LOW (ref 8.9–10.3)
Chloride: 88 mmol/L — ABNORMAL LOW (ref 98–111)
Creatinine, Ser: 0.67 mg/dL (ref 0.61–1.24)
GFR calc Af Amer: >60 mL/min (ref >60)
GFR calc non Af Amer: >60 mL/min (ref >60)
Glucose, Bld: 157 mg/dL — ABNORMAL HIGH (ref 70–99)
Potassium: 3.8 mmol/L (ref 3.5–5.1)
Sodium: 140 mmol/L (ref 135–145)

## 2018-11-23 MED ORDER — LOSARTAN POTASSIUM 25 MG PO TABS
12.5000 mg | ORAL_TABLET | Freq: Every day | ORAL | Status: DC
  Administered 2018-11-23 – 2018-11-24 (×2): 12.5 mg via ORAL
  Filled 2018-11-23 (×2): qty 0.5

## 2018-11-23 NOTE — Progress Notes (Signed)
[place] [facility] [place] NAME: Jason Bryan    MR#:  [id number]  DATE OF BIRTH:  [birth year]  SUBJECTIVE:   Doing a little better this morning. LE edema has almost completely resolved. SOB is improving.  REVIEW OF SYSTEMS:  Review of Systems  Constitutional: Negative for chills, fever and malaise/fatigue.  HENT: Negative for congestion and sore throat.   Respiratory: Positive for cough and shortness of breath. Negative for wheezing.   Cardiovascular: Negative for chest pain and palpitations.  Gastrointestinal: Negative for abdominal pain, constipation, diarrhea, nausea and vomiting.  Genitourinary: Negative for dysuria and urgency.  Musculoskeletal: Negative for back pain and neck pain.  Neurological: Negative for focal weakness, loss of consciousness and weakness.  Psychiatric/Behavioral: Negative for depression. The patient is not nervous/anxious.    DRUG ALLERGIES:  No Known Allergies VITALS:  Blood pressure (!) 96/52, pulse 88, temperature 97.8 F (36.6 C), temperature source Axillary, resp. rate (!) 22, height 5\' 6"  (1.676 m), weight 79.4 kg, SpO2 90 %. PHYSICAL EXAMINATION:  Physical Exam  GENERAL:   Sleepy but easily arousable.  In no acute distress. HEENT: Head atraumatic, normocephalic. Pupils equal, round, reactive to light and accommodation. No scleral icterus. Extraocular muscles intact. Oropharynx and nasopharynx clear.  NECK:  Supple, no jugular venous distention. No thyroid enlargement. LUNGS: + Diminished breath sounds in the lung bases bilaterally. +diffuse expiratory wheezing present. Jason Bryan in place. No use of accessory muscles of respiration.  CARDIOVASCULAR: RRR, S1, S2 normal. No murmurs, rubs, or gallops.  ABDOMEN: Soft, nontender, nondistended. Bowel sounds present.  EXTREMITIES: No cyanosis, or clubbing. No bilateral pitting edema. NEUROLOGIC: CN 2-12 intact, no focal deficits. + Global weakness. Sensation intact  throughout. Gait not checked.  PSYCHIATRIC: The patient is alert and oriented x 3.  SKIN: No obvious rash, lesion, or ulcer.   LABORATORY PANEL:  Male CBC Recent Labs  Lab 11/22/18 0706  WBC 8.9  HGB 15.0  HCT 44.7  PLT 182   ------------------------------------------------------------------------------------------------------------------ Chemistries  Recent Labs  Lab 11/20/18 2106  11/23/18 0541  NA 141   < > 140  K 3.8   < > 3.8  CL 90*   < > 88*  CO2 40*   < > 43*  GLUCOSE 189*   < > 157*  BUN 39*   < > 33*  CREATININE 1.01   < > 0.67  CALCIUM 9.0   < > 8.4*  MG 2.0  --   --    < > = values in this interval not displayed.   RADIOLOGY:  No results found.  CXR FINDINGS: There is a new, moderate right pleural effusion with associated atelectasis or consolidation. There is mild, diffuse interstitial opacity elsewhere throughout. There are nodules of the bilateral upper lobes, not significantly changed compared to prior examination. Cardiomegaly.  IMPRESSION: 1. There is a new, moderate right pleural effusion with associated atelectasis or consolidation.  2. There is mild, diffuse interstitial opacity elsewhere throughout, consistent with edema.  3. There are nodules of the bilateral upper lobes, not significantly changed compared to prior examination and in keeping with known lung malignancy. ASSESSMENT AND PLAN:   Acute hypoxic respiratory failure secondary to acute systolic congestive heart failure, acute COPD exacerbation, and possible aspiration pneumonia- stable on 2L O2. -Wean O2 as able -Duonebs prn  Acute systolic congestive heart failure with non-ischemic cardiomyopathy- may be due to long history of alcohol use. -ECHO with EF <20%. -S/p cardiac catheterization  9/3 with nonobstructive coronary artery disease -Cardiology following -Continue Lasix 40 mg IV bid as able -Continue IV milrinone for today -Started on coreg and losartan -Palliative  care following  Acute COPD exacerbation -Continue IV Solu-Medrol -Continue home inhalers -Duonebs PRN  Possible aspiration pneumonia -Continue unasyn  Cardiogenic shock- due to heart failure -Off pressors since yesterday  Lung cancer- follows with Dr. Donella Bryan for radiation therapy. -Follow-up with oncology outpatient  Tobacco use -Continue nicotine patch  All the records are reviewed and case discussed with Care Management/Social Worker. Management plans discussed with the patient, family and they are in agreement.  CODE STATUS: Full  TOTAL TIME TAKING CARE OF THIS PATIENT: 35 minutes.   More than 50% of the time was spent in counseling/coordination of care: YES  POSSIBLE D/C unknown, DEPENDING ON CLINICAL CONDITION.   Jason Bryan on 11/23/2018 at 2:14 PM  Between 7am to 6pm - Pager [phone]  After 6pm go to [email or web address] - Proofreader  [facility]  Office  [phone]  CC: Primary care physician; Jason Athens, MD  Note: This dictation was prepared with Dragon dictation along with smaller phrase technology. Any transcriptional errors that result from this process are unintentional.

## 2018-11-23 NOTE — Progress Notes (Addendum)
Daily Progress Note   Patient Name: Jason Bryan       Date: 11/23/2018 DOB: April 10, 1955  Age: 63 y.o. MRN#: [id number] Attending Physician: Sela Hua, MD Primary Care Physician: Cletis Athens, MD Admit Date: 11/18/2018  Reason for Consultation/Follow-up: Establishing goals of care  Subjective:  Patient sitting up in bed. He ate a bite of ice cream and coughed multiple times. He coughed up ice cream colored sputum. His wife came in to bedside.   We discussed his diagnosis, prognosis, and GOC.  A detailed discussion was had today regarding advanced directives.  Concepts specific to code status, artifical feeding and hydration, IV antibiotics and rehospitalization were discussed.  The difference between an aggressive medical intervention path and a comfort care path was discussed.  Values and goals of care important to patient and family were attempted to be elicited.  Discussed limitations of medical interventions to prolong quality of life in some situations and discussed the concept of human mortality.  He states initially he would want to be medically optimized and D/C home and placed on comfort. After further conversation, he states " I want anything and everything possible to give me as much time as possible." Discussed CPR and ventilator support, and at the current time, he would like these interventions.         E link family meeting tomorrow at 12:30.    Length of Stay: 5  Current Medications: Scheduled Meds:  . aspirin EC  81 mg Oral Daily  . carvedilol  3.125 mg Oral BID WC  . Chlorhexidine Gluconate Cloth  6 each Topical Daily  . enoxaparin (LOVENOX) injection  40 mg Subcutaneous QHS  . furosemide  40 mg Intravenous BID  . ipratropium-albuterol  3 mL Nebulization TID  .  losartan  12.5 mg Oral Daily  . methylPREDNISolone (SOLU-MEDROL) injection  60 mg Intravenous Q8H  . mometasone-formoterol  2 puff Inhalation BID  . nicotine  7 mg Transdermal Daily  . potassium chloride  20 mEq Oral BID  . sodium chloride flush  3 mL Intravenous Q12H  . temazepam  30 mg Oral QHS  . traZODone  100 mg Oral QHS    Continuous Infusions: . ampicillin-sulbactam (UNASYN) IV Stopped (11/23/18 1231)  . milrinone 0.25 mcg/kg/min (11/23/18 1400)  . phenylephrine (NEO-SYNEPHRINE) Adult infusion Stopped (  11/21/18 1554)    PRN Meds: albuterol, ipratropium-albuterol  Physical Exam Pulmonary:     Effort: Pulmonary effort is normal.  Skin:    General: Skin is warm and dry.  Neurological:     Mental Status: He is alert.             Vital Signs: BP (!) 96/52   Pulse 88   Temp 97.8 F (36.6 C) (Axillary)   Resp (!) 22   Ht 5\' 6"  (1.676 m)   Wt 79.4 kg   SpO2 90%   BMI 28.25 kg/m  SpO2: SpO2: 90 % O2 Device: O2 Device: Nasal Cannula O2 Flow Rate: O2 Flow Rate (L/min): 2 L/min  Intake/output summary:   Intake/Output Summary (Last 24 hours) at 11/23/2018 1409 Last data filed at 11/23/2018 1400 Gross per 24 hour  Intake 565.32 ml  Output 3150 ml  Net -2584.68 ml   LBM: Last BM Date: 11/22/18 Baseline Weight: Weight: 80.7 kg Most recent weight: Weight: 79.4 kg       Palliative Assessment/Data:      Patient Active Problem List   Diagnosis Date Noted  . Dilated cardiomyopathy ([place]) 11/23/2018  . Encephalopathy 11/23/2018  . Bilateral lower extremity edema   . CHF exacerbation ([place])   . COPD exacerbation ([place])   . Acute CHF (congestive heart failure) ([place]) 11/18/2018  . Goals of care, counseling/discussion 06/19/2018  . Weight loss 12/06/2017  . Insomnia 11/16/2015  . Lung cancer, upper lobe ([place]) 04/11/2015  . 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     Code Status:    Code Status Orders  (From admission, onward)         Start     Ordered   11/22/18 1858  Full code  Continuous     11/22/18 1858        Code Status History    This patient has a current code status but no historical code status.   Advance Care Planning Activity       Prognosis:  Poor overall  Discharge Planning:  Home with Palliative Services  Care plan was discussed with RN  Thank you for allowing the Palliative Medicine Team to assist in the care of this patient.   Time In: 1:05 Time Out: 2:15 Total Time 70 min Prolonged Time Billed ues      Greater than 50%  of this time was spent counseling and coordinating care related to the above assessment and plan.  Asencion Gowda, NP  Please contact Palliative Medicine Team phone at [phone] for questions and concerns.

## 2018-11-23 NOTE — Progress Notes (Signed)
CRITICAL CARE NOTE  CC  follow up respiratory failure  SUBJECTIVE Patient remains critically ill Prognosis is guarded Ef<20% Remains on Milrenone Alert and awake   BP 104/61   Pulse 80   Temp 98.4 F (36.9 C) (Oral)   Resp (!) 21   Ht '5\' 6"'  (1.676 m)   Wt 79.4 kg   SpO2 93%   BMI 28.25 kg/m    I/O last 3 completed shifts: In: 1217.1 [P.O.:600; I.V.:223.7; IV Piggyback:393.4] Out: 5855 [Urine:5855] No intake/output data recorded.  SpO2: 93 % O2 Flow Rate (L/min): 3 L/min FiO2 (%): 32 %   SIGNIFICANT EVENTS 9/3 admission to ICU for milrinone infusion End stage CHF 9/5 remains critically ill, SOB, +aspirarion pneumonia  REVIEW OF SYSTEMS  PATIENT IS UNABLE TO PROVIDE COMPLETE REVIEW OF SYSTEMS DUE TO SEVERE CRITICAL ILLNESS   PHYSICAL EXAMINATION:  GENERAL:critically ill appearing, Mild resp distress HEAD: Normocephalic, atraumatic.  EYES: Pupils equal, round, reactive to light.  No scleral icterus.  MOUTH: Moist mucosal membrane. NECK: Supple.  PULMONARY: +rhonchi,  CARDIOVASCULAR: S1 and S2. Regular rate and rhythm. No murmurs, rubs, or gallops.  GASTROINTESTINAL: Soft, nontender, -distended. No masses. Positive bowel sounds. No hepatosplenomegaly.  MUSCULOSKELETAL: + edema.  NEUROLOGIC: alert and awake SKIN:intact,warm,dry  MEDICATIONS: I have reviewed all medications and confirmed regimen as documented   CULTURE RESULTS   Recent Results (from the past 240 hour(s))  SARS Coronavirus 2 [facility] order, Performed in [facility] hospital lab) Nasopharyngeal Nasopharyngeal Swab     Status: None   Collection Time: 11/18/18 12:00 PM   Specimen: Nasopharyngeal Swab  Result Value Ref Range Status   SARS Coronavirus 2 NEGATIVE NEGATIVE Final    Comment: (NOTE) If result is NEGATIVE SARS-CoV-2 target nucleic acids are NOT DETECTED. The SARS-CoV-2 RNA is generally detectable in upper and lower  respiratory specimens during the acute phase of infection.  The lowest  concentration of SARS-CoV-2 viral copies this assay can detect is 250  copies / mL. A negative result does not preclude SARS-CoV-2 infection  and should not be used as the sole basis for treatment or other  patient management decisions.  A negative result may occur with  improper specimen collection / handling, submission of specimen other  than nasopharyngeal swab, presence of viral mutation(s) within the  areas targeted by this assay, and inadequate number of viral copies  (<250 copies / mL). A negative result must be combined with clinical  observations, patient history, and epidemiological information. If result is POSITIVE SARS-CoV-2 target nucleic acids are DETECTED. The SARS-CoV-2 RNA is generally detectable in upper and lower  respiratory specimens dur ing the acute phase of infection.  Positive  results are indicative of active infection with SARS-CoV-2.  Clinical  correlation with patient history and other diagnostic information is  necessary to determine patient infection status.  Positive results do  not rule out bacterial infection or co-infection with other viruses. If result is PRESUMPTIVE POSTIVE SARS-CoV-2 nucleic acids MAY BE PRESENT.   A presumptive positive result was obtained on the submitted specimen  and confirmed on repeat testing.  While 2019 novel coronavirus  (SARS-CoV-2) nucleic acids may be present in the submitted sample  additional confirmatory testing may be necessary for epidemiological  and / or clinical management purposes  to differentiate between  SARS-CoV-2 and other Sarbecovirus currently known to infect humans.  If clinically indicated additional testing with an alternate test  methodology [phone]) is advised. The SARS-CoV-2 RNA is generally  detectable in upper  and lower respiratory sp ecimens during the acute  phase of infection. The expected result is Negative. Fact Sheet for Patients:   StrictlyIdeas.no Fact Sheet for Healthcare Providers: BankingDealers.co.za This test is not yet approved or cleared by the [place] FDA and has been authorized for detection and/or diagnosis of SARS-CoV-2 by FDA under an Emergency Use Authorization (EUA).  This EUA will remain in effect (meaning this test can be used) for the duration of the COVID-19 declaration under Section 564(b)(1) of the Act, 21 U.S.C. section 360bbb-3(b)(1), unless the authorization is terminated or revoked sooner. Performed at [facility], [address] [place], [place] [address]   MRSA PCR Screening     Status: None   Collection Time: 11/19/18  8:06 PM   Specimen: Nasal Mucosa; Nasopharyngeal  Result Value Ref Range Status   MRSA by PCR NEGATIVE NEGATIVE Final    Comment:        The GeneXpert MRSA Assay (FDA approved for NASAL specimens only), is one component of a comprehensive MRSA colonization surveillance program. It is not intended to diagnose MRSA infection nor to guide or monitor treatment for MRSA infections. Performed at [facility], [address] [place], [place] [address]         CBC    Component Value Date/Time   WBC 8.9 11/22/2018 0706   RBC 5.29 11/22/2018 0706   HGB 15.0 11/22/2018 0706   HGB 12.6 (L) 12/21/2013 1111   HCT 44.7 11/22/2018 0706   HCT 38.9 (L) 12/21/2013 1111   PLT 182 11/22/2018 0706   PLT 252 12/21/2013 1111   MCV 84.5 11/22/2018 0706   MCV 99 12/21/2013 1111   MCH 28.4 11/22/2018 0706   MCHC 33.6 11/22/2018 0706   RDW 16.5 (H) 11/22/2018 0706   RDW 17.8 (H) 12/21/2013 1111   LYMPHSABS 0.2 (L) 11/22/2018 0706   LYMPHSABS 0.6 (L) 12/21/2013 1111   MONOABS 0.3 11/22/2018 0706   MONOABS 0.6 12/21/2013 1111   EOSABS 0.0 11/22/2018 0706   EOSABS 0.1 12/21/2013 1111   BASOSABS 0.0 11/22/2018 0706   BASOSABS 0.0 12/21/2013 1111   BMP Latest Ref Rng & Units 11/23/2018 11/22/2018 11/20/2018   Glucose 70 - 99 mg/dL 157(H) 153(H) 189(H)  BUN 8 - 23 mg/dL 33(H) 29(H) 39(H)  Creatinine 0.61 - 1.24 mg/dL 0.67 0.69 1.01  Sodium 135 - 145 mmol/L 140 140 141  Potassium 3.5 - 5.1 mmol/L 3.8 4.0 3.8  Chloride 98 - 111 mmol/L 88(L) 87(L) 90(L)  CO2 22 - 32 mmol/L 43(H) 44(H) 40(H)  Calcium 8.9 - 10.3 mg/dL 8.4(L) 8.4(L) 9.0            ASSESSMENT AND PLAN SYNOPSIS  Acute hypoxic respiratory failure secondary to acute new onset severe systolic CHF exacerbation,moderate size right pleural effusion,AECOPD &Underlying lung cancer  Severe ACUTE Hypoxic and Hypercapnic Respiratory Failure -continue Full MV support -continue Bronchodilator Therapy -Wean Fio2 and PEEP as tolerated -will perform SAT/SBT when respiratory parameters are met  ACUTE SYSTOLIC CARDIAC FAILURE- EF 20% -oxygen as needed -Lasix as tolerated --Milrinone infusion -follow up cardiology recs  ACUTE KIDNEY INJURY/Renal Failure -follow chem 7 -follow UO -continue Foley Catheter-assess need -Avoid nephrotoxic agents -Recheck creatinine  -Renal ultrasound    SHOCK-CARDIOGENIC -use vasopressors to keep MAP>60 if needed -follow ABG and LA -follow up cultures -emperic ABX  CARDIAC ICU monitoring  ID -continue IV abx as prescibed -follow up cultures -aspiration pneumonia  GI GI PROPHYLAXIS as indicated  NUTRITIONAL STATUS DIET-->as tolerated  Constipation protocol as indicated  ENDO - will use ICU hypoglycemic\Hyperglycemia protocol if indicated   ELECTROLYTES -follow labs as needed -replace as needed -pharmacy consultation and following   DVT/GI PRX ordered TRANSFUSIONS AS NEEDED MONITOR FSBS ASSESS the need for LABS as needed   Critical Care Time devoted to patient care services described in this note is 34 minutes.   Overall, patient is critically ill, prognosis is guarded.  Patient with Multiorgan failure and at high risk for cardiac arrest and death.   Remains full  CODE  Corrin Parker, M.D.  [facility]  Medical Director [place] [facility] Director [facility] Cardio-Pulmonary Department

## 2018-11-23 NOTE — Progress Notes (Signed)
Progress Note  Patient Name: Jason Bryan Date of Encounter: 11/23/2018  Primary Cardiologist: [facility], Dr. Rockey Situ  Subjective   No significant events overnight, no significant arrhythmia on telemetry No further coughing or aspiration events On several liters nasal cannula oxygen, resting comfortably -8.5 L -2.5 L yesterday negative on milrinone and Lasix IV Reports improved shortness of breath Nurses report improved mentation  Inpatient Medications    Scheduled Meds: . aspirin EC  81 mg Oral Daily  . carvedilol  3.125 mg Oral BID WC  . Chlorhexidine Gluconate Cloth  6 each Topical Daily  . enoxaparin (LOVENOX) injection  40 mg Subcutaneous QHS  . furosemide  40 mg Intravenous BID  . ipratropium-albuterol  3 mL Nebulization TID  . losartan  12.5 mg Oral Daily  . methylPREDNISolone (SOLU-MEDROL) injection  60 mg Intravenous Q8H  . mometasone-formoterol  2 puff Inhalation BID  . nicotine  7 mg Transdermal Daily  . potassium chloride  20 mEq Oral BID  . sodium chloride flush  3 mL Intravenous Q12H  . temazepam  30 mg Oral QHS  . traZODone  100 mg Oral QHS   Continuous Infusions: . ampicillin-sulbactam (UNASYN) IV 3 g (11/23/18 1200)  . milrinone 0.25 mcg/kg/min (11/23/18 1200)  . phenylephrine (NEO-SYNEPHRINE) Adult infusion Stopped (11/21/18 1554)   PRN Meds: albuterol, ipratropium-albuterol   Vital Signs    Vitals:   11/23/18 0800 11/23/18 0900 11/23/18 1000 11/23/18 1200  BP: 109/68 100/62 106/64   Pulse: 95 86 85   Resp: 19 11 (!) 24   Temp: 98.1 F (36.7 C)   97.8 F (36.6 C)  TempSrc: Axillary   Axillary  SpO2: 91% 90% 94%   Weight:      Height:        Intake/Output Summary (Last 24 hours) at 11/23/2018 1343 Last data filed at 11/23/2018 1200 Gross per 24 hour  Intake 565.18 ml  Output 3150 ml  Net -2584.82 ml   Last 3 Weights 11/23/2018 11/22/2018 11/20/2018  Weight (lbs) 175 lb 0.7 oz 177 lb 14.6 oz 180 lb 8.9 oz  Weight (kg) 79.4 kg 80.7 kg 81.9 kg       Telemetry    Normal sinus rhythm- Personally Reviewed  ECG    No new tracings today - Personally Reviewed  Physical Exam   Constitutional:  Communicative, alert, no distress HENT:  Head: Normocephalic and atraumatic.  Eyes:  no discharge. No scleral icterus.  Neck: Normal range of motion. Neck supple. No JVD present.  Cardiovascular: Normal rate, regular rhythm, normal heart sounds and intact distal pulses. Exam reveals no gallop and no friction rub. No edema No murmur heard. Pulmonary/Chest: Coarse breath sounds, scattered wheezing Abdominal: Soft.  no distension.  no tenderness.  Musculoskeletal: Normal range of motion.  no  tenderness or deformity.  Neurological:  normal muscle tone. Coordination normal. No atrophy Skin: Skin is warm and dry. No rash noted. not diaphoretic.  Psychiatric:  normal mood and affect. behavior is normal. Thought content normal.    Labs    High Sensitivity Troponin:   Recent Labs  Lab 11/18/18 1200 11/18/18 1741  TROPONINIHS 45* 42*      Cardiac EnzymesNo results for input(s): TROPONINI in the last 168 hours. No results for input(s): TROPIPOC in the last 168 hours.   Chemistry Recent Labs  Lab 11/20/18 2106 11/22/18 0706 11/23/18 0541  NA 141 140 140  K 3.8 4.0 3.8  CL 90* 87* 88*  CO2 40* 44*  43*  GLUCOSE 189* 153* 157*  BUN 39* 29* 33*  CREATININE 1.01 0.69 0.67  CALCIUM 9.0 8.4* 8.4*  GFRNONAA >60 >60 >60  GFRAA >60 >60 >60  ANIONGAP 11 9 9      Hematology Recent Labs  Lab 11/18/18 1200 11/20/18 0516 11/22/18 0706  WBC 8.7 11.3* 8.9  RBC 5.67 5.50 5.29  HGB 16.1 15.6 15.0  HCT 48.4 47.6 44.7  MCV 85.4 86.5 84.5  MCH 28.4 28.4 28.4  MCHC 33.3 32.8 33.6  RDW 17.1* 16.9* 16.5*  PLT 240 251 182    BNP Recent Labs  Lab 11/18/18 1155  BNP 3,146.0*     DDimer No results for input(s): DDIMER in the last 168 hours.   Radiology    No results found.  Cardiac Studies   [facility] 11/18/2018  Coronary  angiography:   Right heart pressures RA mean 20 RV 40/10 mean 22 PA 40/24 mean 30 Wedge pressure mean 16 Cardiac output 3.69 Cardiac index 1.93 Nonobstructive coronary disease Mildly elevated right heart pressures Severely reduced LV function Echocardiogram estimate less than 20%  Echo 11/18/2018  1. The left ventricle has severely reduced systolic function, with an ejection fraction of <20%. The cavity size was moderately dilated. Left ventricular diastolic Doppler parameters are indeterminate. Left ventricular diffuse hypokinesis.  2. The right ventricle has mildly reduced systolic function. The cavity was mildly enlarged. There is no increase in right ventricular wall thickness. Right ventricular systolic pressure is mildly elevated with an estimated pressure of 36.1 mmHg.  3. Left atrial size was mildly dilated.  4. Mitral valve regurgitation is moderate  Patient Profile     63 y.o. male with a h/o squamous cell carcinoma of the nasopharynx, lung carcinoma, prior smoking history (40 pk yr), prior alcohol abuse, and COPD admitted with acute systolic CHF.  Assessment & Plan    A/P: Dilated cardiomyopathy, nonischemic -long etoh hx, patient not forthcoming about whether he has been drinking recently  Cardiac catheterization, no obstructive disease, EF <20% Milrinone started post procedure for low output failure, hypotension Initially required pressors for blood pressure support, this has been weaned off Tolerated carvedilol yesterday, Still on milrinone infusion with good urine output -Would continue current dose carvedilol, low-dose losartan added Would hope to trend these doses upward slowly in the next day or so Suspect will be able to wean down on the milrinone tomorrow or following day  Acute renal failure cardiorenal syndrome Continue milrinone, Lasix IV twice daily Renal function improved to normal range currently  Lung carcinoma Previously treated with radiation  Contributing to shortness of breath  COPD 40.00 pack-year smoking history Stopped 63 yo Severe underlying COPD ICU team assisting with inhalers and pulmonary management  ETOH,  stopped per the notes and patient, in 04/2009? Unclear if he still drinks  Confusion Poor status at baseline, slight improvement today Choking episode with hypoxia 3 mornings ago with confusion and subsequent 2 days Appears to be improving yesterday today with better mentation  Discussed with intensivist team  Total encounter time more than 25 minutes  Greater than 50% was spent in counseling and coordination of care with the patient      Signed, Ida Rogue, MD  11/23/2018, 1:43 PM

## 2018-11-23 NOTE — Progress Notes (Signed)
Speech Language Pathology Treatment: Dysphagia  Patient Details Name: Jason Bryan MRN: [id number] DOB: 12-Feb-1956 Today's Date: 11/23/2018 Time: 0850-0930 SLP Time Calculation (min) (ACUTE ONLY): 40 min  Assessment / Plan / Recommendation Clinical Impression  Pt seen today for ongoing assessment of toleration of diet - diet modified to Pureed food d/t Edentulous status and present illness(remains in CCU). Pt appears to present w/ grossly adequate oropharyngeal phase swallow function; oral phase is hampered by lacking Dentition (Edentulous status) thus could result in increased mastication time/effort w/ any solid foods - though pt stated he did not wear Dentures at home prior and ate "regular foods". Pt appeared to swallow trials of thin liquids and purees w/ no immediate, overt s/s of aspiration (no decline in O2 sats - 92%; no immediate cough; vocal quality appeared clear momentarily). However, pt DID exhibit a consistent mod BELCH after trials of thin liquids which was then followed by coughing. When given trials of Nectar consistency liquids(~3ozs), no immediate Belching was noted consistently post swallowing; no delayed coughing except x1. Unsure if the viscosity of the thicker liquids aided Esophageal motility or if there are pharyngeal phase deficits w/ the timing of the thin liquids. No oral phase deficits noted w/ the trials given; timely A-P transfer and oral clearing complete.  Pt remains at risk for aspiration from an oropharyngeal phase standpoint d/t present deconditioning and illness. However, pt could be at Higher risk for aspiration of food/liquids material BACKFLOWING FROM THE ESOPHAGUS DURING BELCHING - this can impact Pulmonary status. No unilateral weakness noted in OM movements, Speech grossly clear.  Due to pt's potenetial Esophageal phase deficits/dysmotility and risk for aspiration of BACKFLOW material, especially w/ Belching, and his current declined medical status, recommend a  modified diet of Purees w/ Nectar consistency liquids. Recommend strict REFLUX precautions; general aspiration precautions; Pills Whole vs Crushed in Puree for easier swallowing and Esophageal phase clearing. Recommend NO Carbonated drinks w/ meals. ST services will f/u w/ objective swallow assessment tomorrow. The above was discussed w/ MD/NSG/CM who agreed; precautions posted.      HPI HPI: Pt is a 63 y.o. 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  Patient admits 1 week history of worsening of lower extremity swelling associated with worsening of the shortness of breath with exertion.  Reports a productive cough and started smoking 3 to 4 cigarettes/day.  Wife at bedside.  Patient sees his primary care physician Dr. Rebecka Apley.  Chest x-ray with pulmonary edema and consolidation"  Nursing reports significant choking/coughing episode with breakfast this AM.      SLP Plan  Continue with current plan of care       Recommendations  Diet recommendations: Dysphagia 1 (puree);Nectar-thick liquid Liquids provided via: Cup;No straw Medication Administration: Whole meds with puree(as able vs crushed) Supervision: Patient able to self feed;Staff to assist with self feeding;Full supervision/cueing for compensatory strategies Compensations: Minimize environmental distractions;Slow rate;Small sips/bites;Lingual sweep for clearance of pocketing;Multiple dry swallows after each bite/sip;Follow solids with liquid Postural Changes and/or Swallow Maneuvers: Seated upright 90 degrees;Upright 30-60 min after meal                General recommendations: (Dietician and Palliative Care f/u) Oral Care Recommendations: Oral care BID;Staff/trained caregiver to provide oral care Follow up  Recommendations:  Skilled Nursing facility(TBD) SLP Visit Diagnosis: Dysphagia, oropharyngeal phase (R13.12);Dysphagia, pharyngoesophageal phase (R13.14) Plan: Continue with current plan of care       [place], [place], CCC-SLP , 11/23/2018, 10:22 AM

## 2018-11-24 ENCOUNTER — Inpatient Hospital Stay: Payer: Medicare Other

## 2018-11-24 DIAGNOSIS — I428 Other cardiomyopathies: Secondary | ICD-10-CM

## 2018-11-24 LAB — BASIC METABOLIC PANEL
Anion gap: 9 (ref 5–15)
BUN: 37 mg/dL — ABNORMAL HIGH (ref 8–23)
CO2: 43 mmol/L — ABNORMAL HIGH (ref 22–32)
Calcium: 8.5 mg/dL — ABNORMAL LOW (ref 8.9–10.3)
Chloride: 90 mmol/L — ABNORMAL LOW (ref 98–111)
Creatinine, Ser: 0.77 mg/dL (ref 0.61–1.24)
GFR calc Af Amer: >60 mL/min (ref >60)
GFR calc non Af Amer: >60 mL/min (ref >60)
Glucose, Bld: 133 mg/dL — ABNORMAL HIGH (ref 70–99)
Potassium: 3.5 mmol/L (ref 3.5–5.1)
Sodium: 142 mmol/L (ref 135–145)

## 2018-11-24 LAB — PHOSPHORUS: Phosphorus: 2.7 mg/dL (ref 2.5–4.6)

## 2018-11-24 MED ORDER — FUROSEMIDE 10 MG/ML IJ SOLN
40.0000 mg | Freq: Every day | INTRAMUSCULAR | Status: DC
  Administered 2018-11-25 – 2018-11-26 (×2): 40 mg via INTRAVENOUS
  Filled 2018-11-24 (×2): qty 4

## 2018-11-24 MED ORDER — METOPROLOL TARTRATE 5 MG/5ML IV SOLN
2.5000 mg | Freq: Three times a day (TID) | INTRAVENOUS | Status: DC
  Administered 2018-11-24 – 2018-11-27 (×8): 2.5 mg via INTRAVENOUS
  Filled 2018-11-24 (×8): qty 5

## 2018-11-24 MED ORDER — METHYLPREDNISOLONE SODIUM SUCC 40 MG IJ SOLR
40.0000 mg | Freq: Two times a day (BID) | INTRAMUSCULAR | Status: DC
  Administered 2018-11-24 – 2018-11-25 (×2): 40 mg via INTRAVENOUS
  Filled 2018-11-24 (×2): qty 1

## 2018-11-24 MED ORDER — CLONAZEPAM 0.125 MG PO TBDP
0.2500 mg | ORAL_TABLET | Freq: Every evening | ORAL | Status: DC | PRN
  Administered 2018-11-25 – 2018-11-26 (×2): 0.25 mg via ORAL
  Filled 2018-11-24 (×2): qty 2

## 2018-11-24 MED ORDER — ENALAPRILAT 1.25 MG/ML IV SOLN
0.6250 mg | Freq: Four times a day (QID) | INTRAVENOUS | Status: DC
  Administered 2018-11-25 – 2018-11-27 (×10): 0.625 mg via INTRAVENOUS
  Filled 2018-11-24 (×14): qty 0.5

## 2018-11-24 MED ORDER — POTASSIUM CHLORIDE 10 MEQ/100ML IV SOLN
10.0000 meq | INTRAVENOUS | Status: AC
  Administered 2018-11-24 (×2): 10 meq via INTRAVENOUS
  Filled 2018-11-24 (×2): qty 100

## 2018-11-24 NOTE — Progress Notes (Signed)
Pt's wife returned to room at this time but refuses to sign MOST form right now due to other family members not being in agreeance to send pt home with hospice care. Pt remains a full code as of now and is NPO but is asking continuously for something to drink.

## 2018-11-24 NOTE — Progress Notes (Addendum)
[facility] [place] NAME: Jason Bryan    MR#:  [id number]  DATE OF BIRTH:  Feb 08, 1956  SUBJECTIVE:  CHIEF COMPLAINT:   Chief Complaint  Patient presents with  . Leg Swelling  . Shortness of Breath   -Remains on 2L oxygen, lying in bed- comfortable appearing.  Complains of being hungry -No chest pain.  No palpitations.  Breathing is better. -Diuresing well on milrinone drip.  REVIEW OF SYSTEMS:  Review of Systems  Constitutional: Positive for malaise/fatigue. Negative for chills and fever.  HENT: Negative for ear discharge, hearing loss and nosebleeds.   Eyes: Negative for blurred vision and double vision.  Respiratory: Positive for shortness of breath. Negative for cough and wheezing.   Cardiovascular: Positive for leg swelling. Negative for chest pain and palpitations.  Gastrointestinal: Negative for abdominal pain, constipation, diarrhea, nausea and vomiting.  Genitourinary: Negative for dysuria.  Musculoskeletal: Positive for myalgias.  Neurological: Positive for weakness. Negative for dizziness, speech change, focal weakness, seizures and headaches.  Psychiatric/Behavioral: Negative for depression.    DRUG ALLERGIES:  No Known Allergies  VITALS:  Blood pressure (!) 94/49, pulse 79, temperature 98.6 F (37 C), temperature source Oral, resp. rate (!) 22, height 5\' 6"  (1.676 m), weight 77.6 kg, SpO2 91 %.  PHYSICAL EXAMINATION:  Physical Exam   GENERAL:  63 y.o.-year-old patient lying in the bed with no acute distress. Ill appearing EYES: Pupils equal, round, reactive to light and accommodation. No scleral icterus. Extraocular muscles intact.  HEENT: Head atraumatic, normocephalic. Oropharynx and nasopharynx clear.  NECK:  Supple, no jugular venous distention. No thyroid enlargement, no tenderness.  LUNGS: Diminished breath sounds bilaterally, some coarse breath sounds heard posteriorly, no wheezing, rales,rhonchi or  crepitation. No use of accessory muscles of respiration.  CARDIOVASCULAR: S1, S2 normal. No  rubs, or gallops.  3/6 systolic murmur is present ABDOMEN: Soft, nontender, nondistended. Bowel sounds present. No organomegaly or mass.  EXTREMITIES: No pedal edema, cyanosis, or clubbing.  NEUROLOGIC: Cranial nerves II through XII are intact. Muscle strength 5/5 in all extremities. Sensation intact. Gait not checked.  Global weakness. PSYCHIATRIC: The patient is alert and oriented x 3.  SKIN: No obvious rash, lesion, or ulcer.    LABORATORY PANEL:   CBC Recent Labs  Lab 11/22/18 0706  WBC 8.9  HGB 15.0  HCT 44.7  PLT 182   ------------------------------------------------------------------------------------------------------------------  Chemistries  Recent Labs  Lab 11/20/18 2106  11/24/18 0321  NA 141   < > 142  K 3.8   < > 3.5  CL 90*   < > 90*  CO2 40*   < > 43*  GLUCOSE 189*   < > 133*  BUN 39*   < > 37*  CREATININE 1.01   < > 0.77  CALCIUM 9.0   < > 8.5*  MG 2.0  --   --    < > = values in this interval not displayed.   ------------------------------------------------------------------------------------------------------------------  Cardiac Enzymes No results for input(s): TROPONINI in the last 168 hours. ------------------------------------------------------------------------------------------------------------------  RADIOLOGY:  Dg Chest Port 1 View  Result Date: 11/24/2018 CLINICAL DATA:  Admitted with respiratory failure. Smaller more smoker. History of lung cancer. EXAM: PORTABLE CHEST 1 VIEW COMPARISON:  11/20/2018; 11/18/2018; chest CT-06/09/2018 FINDINGS: Grossly unchanged enlarged cardiac silhouette and mediastinal contours with nodularity of the bilateral pulmonary hila. The pulmonary vasculature remains indistinct with cephalization of flow. No change to slight increase in small to moderate-sized right-sided effusion  with right basilar  heterogeneous/consolidative opacities. Unchanged ill-defined nodular opacity within the left upper lung measuring approximately 2.2 x 2.2 cm. No new focal airspace opacities. No pneumothorax. No acute osseous abnormalities. IMPRESSION: 1. Similar findings of suspected pulmonary edema with no change to slight increase in size of small to moderate-sized effusion and associated right basilar opacities, atelectasis versus infiltrate. 2. Similar findings compatible provided history of lung cancer including grossly unchanged approximately 2.2 cm left upper lobe nodule. Electronically Signed   By: Sandi Mariscal M.D.   On: 11/24/2018 09:04    EKG:   Orders placed or performed during the hospital encounter of 11/18/18  . EKG 12-Lead  . EKG 12-Lead  . EKG  . EKG 12-Lead  . EKG 12-Lead  . EKG 12-Lead  . EKG 12-Lead    ASSESSMENT AND PLAN:   63 year old male with past medical history significant for COPD, prior alcohol use, history of lung carcinoma and squamous cell carcinoma of the nasopharynx admitted to hospital secondary to acute congestive heart failure.  1.  Acute systolic heart failure with reduced ejection fraction-nonischemic cardiomyopathy -Appreciate cardiology consult. -Patient has been in ICU on milrinone drip.  Diuresing well. -Patient had right heart cath and left heart cath done this admission showing nonobstructive CAD  -Based on blood pressure improvement, beta-blocker and enalapril can be started -Plan to wean off milrinone tomorrow pending renal function.  2.  Acute renal failure-concern for cardiorenal syndrome. -Improving on milrinone infusion -Appreciate nephrology consult. -10 L net negative since admission  3.  Possible aspiration pneumonia-on Unasyn -Normal WBC count, on 2 L oxygen  4.  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  DVT prophylaxis-on Lovenox  Appreciate palliative care input  All the records are  reviewed and case discussed with Care Management/Social Workerr. Management plans discussed with the patient, family and they are in agreement.  CODE STATUS: Full code  TOTAL TIME TAKING CARE OF THIS PATIENT: 39 minutes.   POSSIBLE D/C IN ? DAYS, DEPENDING ON CLINICAL CONDITION.   Gladstone Lighter M.D on 11/24/2018 at 2:35 PM  Between 7am to 6pm - Pager - [phone]  After 6pm go to [email or web address] - password [email or web address]  Office  [phone]  CC: Primary care physician; Cletis Athens, MD

## 2018-11-24 NOTE — Progress Notes (Signed)
Dr. Patsey Berthold is okay with patient traveling to radiology for barium swallow without ICU RN.

## 2018-11-24 NOTE — Clinical Social Work Note (Signed)
CSW acknowledges consult, "May need help in the home at DC, may need Hospice, may need O2 at home, or continuous inotropic like milrinone or dobutamine." Per yesterday's palliative note, patient wants everything done so plan will likely be home with outpatient palliative follow up. Cardiology note from today states Milrinone might be discontinued tomorrow pending labs. Will continue to follow for oxygen and home needs. Please place orders for PT and OT if home health would be beneficial.  Dayton Scrape, [place]

## 2018-11-24 NOTE — Progress Notes (Addendum)
Daily Progress Note   Patient Name: Jason Bryan       Date: 11/24/2018 DOB: 09/07/1955  Age: 63 y.o. MRN#: [id number] Attending Physician: Gladstone Lighter, MD Primary Care Physician: Cletis Athens, MD Admit Date: 11/18/2018  Reason for Consultation/Follow-up: Establishing goals of care  Subjective:  Patient sitting up in bed. He failed his swallow evaluation. He has confusion that waxes and wanes, and concern for abstract thinking of the future. He states he wants to go home and take a shower and see his cat. He is thirsty. He also states he wants to do whatever is needed to lengthen his time on earth. His wife is at bedside, daughter is on E link. CCM in to answer questions and discuss status. Wife asked patient many questions as she struggles with him not wanting to continue life prolonging care. He would like to eat and drink. He states he will do whatever it takes to leave the hospital. When it was explained he does not have to have a feeding tube to leave the hospital if he would like to pursue comfort focused care, he stated he wants to go home. Family spoke together, and then I reengaged. Many questions answered. Discussed quality vs quantity. Wife states she believes she will be okay with home with hospice without Milrinone. He asks for something to drink and she tells him to wait a bit longer. She wants to step out and speak with her family members.   Length of Stay: 6  Current Medications: Scheduled Meds:  . Chlorhexidine Gluconate Cloth  6 each Topical Daily  . enalaprilat  0.625 mg Intravenous Q6H  . enoxaparin (LOVENOX) injection  40 mg Subcutaneous QHS  . [START ON 11/25/2018] furosemide  40 mg Intravenous Daily  . ipratropium-albuterol  3 mL Nebulization TID  . methylPREDNISolone  (SOLU-MEDROL) injection  40 mg Intravenous Q12H  . metoprolol tartrate  2.5 mg Intravenous Q8H  . sodium chloride flush  3 mL Intravenous Q12H    Continuous Infusions: . ampicillin-sulbactam (UNASYN) IV 3 g (11/24/18 0743)  . milrinone 0.125 mcg/kg/min (11/24/18 1037)  . phenylephrine (NEO-SYNEPHRINE) Adult infusion Stopped (11/21/18 1554)    PRN Meds: albuterol, clonazepam, ipratropium-albuterol  Physical Exam Pulmonary:     Effort: Pulmonary effort is normal.  Skin:    General: Skin is warm and  dry.  Neurological:     Mental Status: He is alert.             Vital Signs: BP (!) 94/49   Pulse 79   Temp 98.6 F (37 C) (Oral)   Resp (!) 22   Ht 5\' 6"  (1.676 m)   Wt 77.6 kg   SpO2 91%   BMI 27.61 kg/m  SpO2: SpO2: 91 % O2 Device: O2 Device: Nasal Cannula O2 Flow Rate: O2 Flow Rate (L/min): 2 L/min  Intake/output summary:   Intake/Output Summary (Last 24 hours) at 11/24/2018 1417 Last data filed at 11/24/2018 0931 Gross per 24 hour  Intake 601.26 ml  Output 2375 ml  Net -1773.74 ml   LBM: Last BM Date: 11/23/18 Baseline Weight: Weight: 80.7 kg Most recent weight: Weight: 77.6 kg       Palliative Assessment/Data:      Patient Active Problem List   Diagnosis Date Noted  . Dilated cardiomyopathy ([place]) 11/23/2018  . Encephalopathy 11/23/2018  . Bilateral lower extremity edema   . CHF exacerbation ([place])   . COPD exacerbation ([place])   . Acute CHF (congestive heart failure) ([place]) 11/18/2018  . Goals of care, counseling/discussion 06/19/2018  . Weight loss 12/06/2017  . Insomnia 11/16/2015  . Lung cancer, upper lobe ([place]) 04/11/2015  . 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 Squamous cell carcinoma of nasopharynx ([facility]) 02/15/2010    Palliative Care Assessment & Plan   Recommendations/Plan:  Full code/full scope Considering home with hospice.     Code Status:    Code Status Orders  (From  admission, onward)         Start     Ordered   11/22/18 1858  Full code  Continuous     11/22/18 1858        Code Status History    This patient has a current code status but no historical code status.   Advance Care Planning Activity       Prognosis:  Poor overall. < 6 months.    Discharge Planning:  Home with [place] was discussed with RN  Thank you for allowing the Palliative Medicine Team to assist in the care of this patient.   Time In: 12:30 Time Out: 3:25 Total Time 2 hours 55 min Prolonged Time Billed yes      Greater than 50%  of this time was spent counseling and coordinating care related to the above assessment and plan.  Asencion Gowda, NP  Please contact Palliative Medicine Team phone at [phone] for questions and concerns.

## 2018-11-24 NOTE — Consult Note (Signed)
Pharmacy Antibiotic Note  Jason Bryan is a 63 y.o. male admitted on 11/18/2018 with Respiratory Failure. Pharmacy has been consulted for Unasyn dosing for aspiration PNA.  Today is day 4 of antibiotic therapy for aspiration PNA. Consider discontinuing therapy tomorrow after 5 days of treatment.  Plan: Continue Unasyn 3g IV every 6 hours   Height: 5\' 6"  (167.6 cm) Weight: 171 lb 1.2 oz (77.6 kg) IBW/kg (Calculated) : 63.8  Temp (24hrs), Avg:98.4 F (36.9 C), Min:97.7 F (36.5 C), Max:99 F (37.2 C)  Recent Labs  Lab 11/18/18 1200  11/20/18 0516 11/20/18 1542 11/20/18 2106 11/22/18 0706 11/23/18 0541 11/24/18 0321  WBC 8.7  --  11.3*  --   --  8.9  --   --   CREATININE 1.17   < > 1.01 0.85 1.01 0.69 0.67 0.77   < > = values in this interval not displayed.    Estimated Creatinine Clearance: 90.2 mL/min (by C-G formula based on SCr of 0.77 mg/dL).    No Known Allergies  Antimicrobials this admission: 9/2 Azithromycin >> 9/6 9/5 Unasyn  >>   Microbiology results: 9/3 MRSA PCR: negative   Thank you for allowing pharmacy to be a part of this patient's care.  [place] Resident 11/24/2018 3:16 PM

## 2018-11-24 NOTE — Evaluation (Addendum)
Objective Swallowing Evaluation: Type of Study: MBS-Modified Barium Swallow Study   Patient Details  Name: RANDEE UPCHURCH MRN: [id number] Date of Birth: [birth year]  Today's Date: 11/24/2018 Time: SLP Start Time (ACUTE ONLY): 1000 -SLP Stop Time (ACUTE ONLY): 1100  SLP Time Calculation (min) (ACUTE ONLY): 60 min   Past Medical History:  Past Medical History:  Diagnosis Date  . Cancer ([place]) 2011   squemous stage 3, head and neck Ca with chemo + rad tx's.   . Lung cancer, upper lobe ([place]) 04/11/2015  . Squamous cell lung cancer ([place]) 2015   Right Lung CA with chemo + rad tx's.   Past Surgical History:  Past Surgical History:  Procedure Laterality Date  . RIGHT/LEFT HEART CATH AND CORONARY ANGIOGRAPHY N/A 11/19/2018   Procedure: RIGHT/LEFT HEART CATH AND CORONARY ANGIOGRAPHY;  Surgeon: Minna Merritts, MD;  Location: [facility];  Service: Cardiovascular;  Laterality: N/A;   HPI: Pt is a 63 y.o. 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  Patient admits 1 week history of worsening of lower extremity swelling associated with worsening of the shortness of breath with exertion.  Reports a productive cough and started smoking 3 to 4 cigarettes/day.  Wife at bedside.  Patient sees his primary care physician Dr. Rebecka Apley.  Chest x-ray with pulmonary edema and consolidation"  Nursing reports significant choking/coughing episode with breakfast this AM.   Subjective: none given    Assessment / Plan / Recommendation  CHL IP CLINICAL IMPRESSIONS 11/24/2018  Clinical Impression Pt appears to present w/ Severe oropharyngeal phase dysphagia w/ HIGH risk for aspiration w/ any bolus consistency despite use of aspiration precautions and a modified diet. His dysphagia at this study  resulted in laryngeal Penetration and Aspiration of oral trials which could lead to Pulmonary compromise; as well as inability to fully meet nutrition/hydration needs adequately and safely.  During the pharyngeal phase, pt exhibited moderate-severe delay in pharyngeal swallow initiation AND reduced pharyngeal-laryngeal sensation w/ trials assessed. This resulted in immediate laryngeal Penetration of bolus trials into the laryngeal Vestibule BEFORE any initiation of the pharyngeal swallow; laryngeal Penetration was followed by Aspiration of the material. During the swallow, NO Epiglottic inversion was noted thus reducing airway closure/protection. Also, noted during the swallow was reduced laryngeal excursion and anterior movement as well as decreased pharyngeal pressure. These factors also led to reduced airway closure/protection as well as resulted in pharyngeal residue throughout the pharynx post swallow, respectively. This pharyngeal residue became laryngeal Penetration b/t trials while pt was at rest(decreased sensation of such). Of concern was the fact that pt exhibited decreased laryngeal sensation and a MUCH DELAYED COUGH RESPONSE to the laryngeal Penetration and Aspiration of the Nectar consistency liquid and puree trials. Pt's cough was effective in reducing/clearing the viewable material in the larynx, however, laryngeal Penetration from pharyngeal residue continued to occur b/t trials to which pt was mostly insensate to(Penetration w/ no cough or throat clearing responses until cued to do so often). During the oral phase, pt exhibited decreased oral phase control of bolus consistencies assessed resulting in bolus loss and premature spillage of bolus trials into the pharynx. Min oral residue noted post swallow which reduced/cleared w/ completion of f/u, dry swallow. Pt is Edentulous which impacts effective and successful mastication of  solid food trials. During the Esophageal phase, decreased timing of  UES opening noted, but suspect this could be more related to pt's pharyngeal weakness and decreased movement during swallowing. However, pt does have baseline h/o "squemous stage 3, head and neck Ca with chemo + rad tx's"(nasophayrynx) and the XRT txs could have impacted pharyngeal tissue and physiology.   These results were discussed w/ MD, NSG and Palliative Care. Due to results of this study, and pt's consistent Aspiration of all consistencies assessed at this study, an NPO status is recommended d/t HIGH risk for Pulmonary impact/decline; pt is also a Full Code status currently. Rec. f/u discussion for GOC including alternative means of feeding w/ pt/Wife; MD and Palliative Care.   SLP Visit Diagnosis Dysphagia, oropharyngeal phase (R13.12);Dysphagia, pharyngoesophageal phase (R13.14)  Attention and concentration deficit following --  Frontal lobe and executive function deficit following --  Impact on safety and function Severe aspiration risk;Risk for inadequate nutrition/hydration      CHL IP TREATMENT RECOMMENDATION 11/24/2018  Treatment Recommendations Therapy as outlined in treatment plan below     Prognosis 11/24/2018  Prognosis for Safe Diet Advancement Guarded  Barriers to Reach Goals Time post onset;Severity of deficits  Barriers/Prognosis Comment --    CHL IP DIET RECOMMENDATION 11/24/2018  SLP Diet Recommendations NPO  Liquid Administration via --  Medication Administration Via alternative means  Compensations --  Postural Changes --      CHL IP OTHER RECOMMENDATIONS 11/24/2018  Recommended Consults (No Data)  Oral Care Recommendations Oral care QID;Staff/trained caregiver to provide oral care  Other Recommendations --      CHL IP FOLLOW UP RECOMMENDATIONS 11/24/2018  Follow up Recommendations (No Data)      CHL IP FREQUENCY AND DURATION 11/24/2018  Speech Therapy Frequency (ACUTE ONLY) (No Data)  Treatment Duration (No Data)           CHL IP ORAL PHASE 11/24/2018  Oral  Phase Impaired  Oral - Pudding Teaspoon --  Oral - Pudding Cup --  Oral - Honey Teaspoon --  Oral - Honey Cup --  Oral - Nectar Teaspoon 3  Oral - Nectar Cup --  Oral - Nectar Straw --  Oral - Thin Teaspoon --  Oral - Thin Cup --  Oral - Thin Straw --  Oral - Puree 3  Oral - Mech Soft --  Oral - Regular --  Oral - Multi-Consistency --  Oral - Pill --  Oral Phase - Comment pt exhibited decreased oral phase control of bolus consistencies assessed resulting in premature spillage of bolus trials into the pharynx. Min oral residue noted post swallow which reduced/cleared w/ completion of f/u, dry swallow. Pt is Edentulous which impacts success w/ effectively masticating solid food trials    CHL IP PHARYNGEAL PHASE 11/24/2018  Pharyngeal Phase Impaired  Pharyngeal- Pudding Teaspoon --  Pharyngeal --  Pharyngeal- Pudding Cup --  Pharyngeal --  Pharyngeal- Honey Teaspoon --  Pharyngeal --  Pharyngeal- Honey Cup --  Pharyngeal --  Pharyngeal- Nectar Teaspoon 3  Pharyngeal --  Pharyngeal- Nectar Cup --  Pharyngeal --  Pharyngeal- Nectar Straw --  Pharyngeal --  Pharyngeal- Thin Teaspoon --  Pharyngeal --  Pharyngeal- Thin Cup --  Pharyngeal --  Pharyngeal- Thin Straw --  Pharyngeal --  Pharyngeal- Puree 3  Pharyngeal --  Pharyngeal- Mechanical Soft --  Pharyngeal --  Pharyngeal- Regular --  Pharyngeal --  Pharyngeal- Multi-consistency --  Pharyngeal --  Pharyngeal- Pill --  Pharyngeal --  Pharyngeal  Comment pt exhibited moderate-severe delay in pharyngeal swallow initiation AND reduced pharyngeal-laryngeal sensation. This resulted in immediate laryngeal Penetration of bolus trials into the laryngeal Vestibule BEFORE the initiation of the swallow; laryngeal Penetration was followed by Aspiration of the material. During the swallow, NO Epiglottic inversion was noted reducing airway closure/protection. Also, noted during the swallow was reduced laryngeal excursion and anterior  movement as well as decreased pharyngeal pressure. This factors also led to reduced airway closure/protection as well as resulted in pharyngeal residue throughout post swallow, respectively. This pharyngeal residue became laryngeal Penetration b/t trials while pt was at rest. Of concern was the fact that pt exhibited a MUCH DELAYED COUGH RESPONSE to the laryngeal Penetration and Aspiration of the Nectar consistency liquid and puree trials. Pt's cough was effective in reducing/clearing the viewable material in the larynx, however, laryngeal Penetration from pharyngeal residue continued to occur b/t trials - pt was mostly insensate to initial laryngeal Penetration(no cough or throat clearing responses until cued to do so).      CHL IP CERVICAL ESOPHAGEAL PHASE 11/24/2018  Cervical Esophageal Phase Impaired  Pudding Teaspoon --  Pudding Cup --  Honey Teaspoon --  Honey Cup --  Nectar Teaspoon 3  Nectar Cup --  Nectar Straw --  Thin Teaspoon --  Thin Cup --  Thin Straw --  Puree 3  Mechanical Soft --  Regular --  Multi-consistency --  Pill --  Cervical Esophageal Comment decreased timing of UES opening but suspect this could be more related to pt's pharyngeal weakness during swallowing. However, pt does have baseline h/o "squemous stage 3, head and neck Ca with chemo + rad tx's"(nasophayrynx) and the XRT txs could have impacted pharyngeal tissue and physiology.         Orinda Kenner, MS, CCC-SLP [place] 11/24/2018, 2:11 PM

## 2018-11-24 NOTE — Progress Notes (Signed)
Pt returned to unit at this time. VSS.

## 2018-11-24 NOTE — Progress Notes (Signed)
Progress Note  Patient Name: Jason Bryan Date of Encounter: 11/24/2018  Primary Cardiologist: new to [facility] - consult by [facility]  Subjective   Breathing is much improved. No chest pain or palpitations.  Remains in the ICU on 0.25 mcg of milrinone to augment diuresis with documented UOP of 2.1 L for the past 24 hours with a net - 10.4 L for the admission. Weight 79.4-->77.6 kg. Renal function remains stable/normal. Potassium 3.5.   Inpatient Medications    Scheduled Meds: . aspirin EC  81 mg Oral Daily  . carvedilol  3.125 mg Oral BID WC  . Chlorhexidine Gluconate Cloth  6 each Topical Daily  . enoxaparin (LOVENOX) injection  40 mg Subcutaneous QHS  . furosemide  40 mg Intravenous BID  . ipratropium-albuterol  3 mL Nebulization TID  . losartan  12.5 mg Oral Daily  . methylPREDNISolone (SOLU-MEDROL) injection  60 mg Intravenous Q8H  . mometasone-formoterol  2 puff Inhalation BID  . nicotine  7 mg Transdermal Daily  . potassium chloride  20 mEq Oral BID  . sodium chloride flush  3 mL Intravenous Q12H  . temazepam  30 mg Oral QHS  . traZODone  100 mg Oral QHS   Continuous Infusions: . ampicillin-sulbactam (UNASYN) IV 3 g (11/24/18 0743)  . milrinone 0.25 mcg/kg/min (11/23/18 2243)  . phenylephrine (NEO-SYNEPHRINE) Adult infusion Stopped (11/21/18 1554)   PRN Meds: albuterol, ipratropium-albuterol   Vital Signs    Vitals:   11/24/18 0406 11/24/18 0500 11/24/18 0600 11/24/18 0700  BP:   (!) 97/58 105/66  Pulse:  70 68 76  Resp:  (!) 27 (!) 21 16  Temp:      TempSrc:      SpO2:  96% 94% 97%  Weight: 77.6 kg     Height:        Intake/Output Summary (Last 24 hours) at 11/24/2018 0746 Last data filed at 11/24/2018 0500 Gross per 24 hour  Intake 506.59 ml  Output 2675 ml  Net -2168.41 ml   Filed Weights   11/22/18 0500 11/23/18 0436 11/24/18 0406  Weight: 80.7 kg 79.4 kg 77.6 kg    Telemetry    SR with 11 beat run of NSVT, rare PVC - Personally Reviewed  ECG    n/a - Personally Reviewed  Physical Exam   GEN: No acute distress.   Neck: No JVD. Cardiac: RRR, no murmurs, rubs, or gallops.  Respiratory: Diminished, coarse breath sounds bilaterally.  GI: Soft, nontender, non-distended.   MS: No edema; No deformity. Neuro:  Alert and oriented x 3; Nonfocal.  Psych: Normal affect.  Labs    Chemistry Recent Labs  Lab 11/22/18 0706 11/23/18 0541 11/24/18 0321  NA 140 140 142  K 4.0 3.8 3.5  CL 87* 88* 90*  CO2 44* 43* 43*  GLUCOSE 153* 157* 133*  BUN 29* 33* 37*  CREATININE 0.69 0.67 0.77  CALCIUM 8.4* 8.4* 8.5*  GFRNONAA >60 >60 >60  GFRAA >60 >60 >60  ANIONGAP 9 9 9      Hematology Recent Labs  Lab 11/18/18 1200 11/20/18 0516 11/22/18 0706  WBC 8.7 11.3* 8.9  RBC 5.67 5.50 5.29  HGB 16.1 15.6 15.0  HCT 48.4 47.6 44.7  MCV 85.4 86.5 84.5  MCH 28.4 28.4 28.4  MCHC 33.3 32.8 33.6  RDW 17.1* 16.9* 16.5*  PLT 240 251 182    Cardiac EnzymesNo results for input(s): TROPONINI in the last 168 hours. No results for input(s): TROPIPOC in the last  168 hours.   BNP Recent Labs  Lab 11/18/18 1155  BNP 3,146.0*     DDimer No results for input(s): DDIMER in the last 168 hours.   Radiology    No results found.  Cardiac Studies   2D Echo 11/18/2018: 1. The left ventricle has severely reduced systolic function, with an ejection fraction of <20%. The cavity size was moderately dilated. Left ventricular diastolic Doppler parameters are indeterminate. Left ventricular diffuse hypokinesis.  2. The right ventricle has mildly reduced systolic function. The cavity was mildly enlarged. There is no increase in right ventricular wall thickness. Right ventricular systolic pressure is mildly elevated with an estimated pressure of 36.1 mmHg.  3. Left atrial size was mildly dilated.  4. Mitral valve regurgitation is moderate __________  [facility] 11/19/2018: Coronary angiography:  Coronary dominance: Right  Left mainstem:   Large vessel that  bifurcates into the LAD and left circumflex, no significant disease noted  Left anterior descending (LAD):   Large vessel that extends to the apical region, diagonal branch 2 of moderate size, no significant disease noted  Left circumflex (LCx):  Large vessel with OM branch 2, no significant disease noted  Right coronary artery (RCA):  Right dominant vessel with PL and PDA, no significant disease noted  Left ventriculography: Left ventricular systolic function is normal, LVEF is estimated at 55-65%, there is no significant mitral regurgitation , no significant aortic valve stenosis  Right heart pressures RA mean 20 RV 40/10 mean 22 PA 40/24 mean 30 Wedge pressure mean 16 Cardiac output 3.69 Cardiac index 1.93  Final Conclusions:   Nonobstructive coronary disease Mildly elevated right heart pressures Severely reduced LV function Echocardiogram estimate less than 20%  Recommendations:  Medical management recommended For now would continue carvedilol 3.125 twice daily with losartan 25 daily Hold diltiazem Lasix 20 IV twice daily  Patient Profile     63 y.o. male with history of squamous cell carcinoma of the nasopharynx, lung carcinoma, prior alcohol abuse, and COPD admitted with acute systolic CHF.   Assessment & Plan    1. Acute dilated HFrEF secondary to NICM: -Much improved -Patient with a long history of EtOH abuse that he initially denied multiple times, uncertain if he continues to drink -[facility] this admission showed nonobstructive CAD with RHC as above -Transferred to the ICU on 9/3 for milrinone infusion, briefly required pressor support for hypotension -Started on Coreg 9/6 and losartan 9/7 with BP ranging from the 80s to low 846K systolic -Unable to escalate evidence-based heart failure therapy further at this time secondary to hypotension  -Continue IV Lasix 40 mg bid with KCl repletion -Will likely decrease milrinone to 0.125 mcg today after discussing with MD  with possible discontinuation on 9/9 pending renal function and vitals   2. ARF/cardiorenal syndrome: -Improved on milrinone infusion -Wean milrinone as above  3. COPD/lung carcinoma: -Prior 40-pack year history of tobacco abuse -Status post radiation therapy -Follow up with pulmonology and oncology as directed  4. EtOH abuse: -Patient reported quitting in 2014   For questions or updates, please contact [place] Please consult [email or web address] for contact info under Cardiology/STEMI.    Signed, Christell Faith, PA-C [place] Pager: [phone] 11/24/2018, 7:46 AM

## 2018-11-24 NOTE — Progress Notes (Signed)
Pt off unit at this time for barium swallow test.

## 2018-11-25 DIAGNOSIS — J9601 Acute respiratory failure with hypoxia: Secondary | ICD-10-CM

## 2018-11-25 DIAGNOSIS — J9602 Acute respiratory failure with hypercapnia: Secondary | ICD-10-CM

## 2018-11-25 LAB — BASIC METABOLIC PANEL
Anion gap: 12 (ref 5–15)
BUN: 37 mg/dL — ABNORMAL HIGH (ref 8–23)
CO2: 34 mmol/L — ABNORMAL HIGH (ref 22–32)
Calcium: 8.5 mg/dL — ABNORMAL LOW (ref 8.9–10.3)
Chloride: 93 mmol/L — ABNORMAL LOW (ref 98–111)
Creatinine, Ser: 0.6 mg/dL — ABNORMAL LOW (ref 0.61–1.24)
GFR calc Af Amer: >60 mL/min (ref >60)
GFR calc non Af Amer: >60 mL/min (ref >60)
Glucose, Bld: 132 mg/dL — ABNORMAL HIGH (ref 70–99)
Potassium: 3.8 mmol/L (ref 3.5–5.1)
Sodium: 139 mmol/L (ref 135–145)

## 2018-11-25 LAB — MAGNESIUM: Magnesium: 2.2 mg/dL (ref 1.7–2.4)

## 2018-11-25 NOTE — Progress Notes (Signed)
Ch received a referral to f/u with pt and family. Ch spoke w/ pt at bedside regarding the pt's hope for his GOC and EOL measures and encouraged pt to express what it is that he desires that would help him to maintain a quality of life. Pt shared that he wants to go home as long as he has help in place such as H-H and pain management. Pt did not appear to be in distress in spite of removing the nasal canula while lying in the bed. Pt stated that his family wants him to go home as well. Ch shared with the pt that his quality of life is just as important as his medical needs and that it should be honored. Pt agreed and appreciated visit.  F/U support needed for the family.    11/25/18 1200  Clinical Encounter Type  Visited With Patient;Family  Visit Type Psychological support;Spiritual support;Social support;Critical Care  Referral From Physician  Consult/Referral To Chaplain  Spiritual Encounters  Spiritual Needs Emotional;Grief support  Stress Factors  Patient Stress Factors Health changes;Loss;Loss of control;Major life changes  Family Stress Factors Loss;Loss of control;Major life changes

## 2018-11-25 NOTE — Progress Notes (Signed)
[place] [facility] [place] NAME: Jason Bryan    MR#:  [id number]  DATE OF BIRTH:  23-Jun-1955  SUBJECTIVE:  CHIEF COMPLAINT:   Chief Complaint  Patient presents with  . Leg Swelling  . Shortness of Breath   -More coarse and gurgling breath sounds today.  Patient remains n.p.o. as he failed swallow evaluation.  However eating ice chips -On oxygen via nasal cannula.  On milrinone drip  REVIEW OF SYSTEMS:  Review of Systems  Constitutional: Positive for malaise/fatigue. Negative for chills and fever.  HENT: Negative for ear discharge, hearing loss and nosebleeds.   Eyes: Negative for blurred vision and double vision.  Respiratory: Positive for shortness of breath. Negative for cough and wheezing.   Cardiovascular: Positive for leg swelling. Negative for chest pain and palpitations.  Gastrointestinal: Negative for abdominal pain, constipation, diarrhea, nausea and vomiting.  Genitourinary: Negative for dysuria.  Musculoskeletal: Positive for myalgias.  Neurological: Positive for weakness. Negative for dizziness, speech change, focal weakness, seizures and headaches.  Psychiatric/Behavioral: Negative for depression.    DRUG ALLERGIES:  No Known Allergies  VITALS:  Blood pressure 110/69, pulse 65, temperature 98 F (36.7 C), temperature source Oral, resp. rate (!) 25, height 5\' 6"  (1.676 m), weight 77.3 kg, SpO2 92 %.  PHYSICAL EXAMINATION:  Physical Exam   GENERAL:  63 y.o.-year-old patient lying in the bed with no acute distress. Ill appearing EYES: Pupils equal, round, reactive to light and accommodation. No scleral icterus. Extraocular muscles intact.  HEENT: Head atraumatic, normocephalic. Oropharynx and nasopharynx clear.  NECK:  Supple, no jugular venous distention. No thyroid enlargement, no tenderness.  LUNGS: Upper airway gurgling sounds heard, diminished breath sounds bilaterally, with coarse breath sounds heard posteriorly, no  wheezing, rales,rhonchi or crepitation. No use of accessory muscles of respiration.  CARDIOVASCULAR: S1, S2 normal. No  rubs, or gallops.  3/6 systolic murmur is present ABDOMEN: Soft, nontender, nondistended. Bowel sounds present. No organomegaly or mass.  EXTREMITIES: No pedal edema, cyanosis, or clubbing.  NEUROLOGIC: Cranial nerves II through XII are intact. Muscle strength equal in all extremities. Sensation intact. Gait not checked.  Global weakness. PSYCHIATRIC: The patient is alert and oriented x 3.  SKIN: No obvious rash, lesion, or ulcer.    LABORATORY PANEL:   CBC Recent Labs  Lab 11/22/18 0706  WBC 8.9  HGB 15.0  HCT 44.7  PLT 182   ------------------------------------------------------------------------------------------------------------------  Chemistries  Recent Labs  Lab 11/25/18 0939  NA 139  K 3.8  CL 93*  CO2 34*  GLUCOSE 132*  BUN 37*  CREATININE 0.60*  CALCIUM 8.5*  MG 2.2   ------------------------------------------------------------------------------------------------------------------  Cardiac Enzymes No results for input(s): TROPONINI in the last 168 hours. ------------------------------------------------------------------------------------------------------------------  RADIOLOGY:  Dg Chest Port 1 View  Result Date: 11/24/2018 CLINICAL DATA:  Admitted with respiratory failure. Smaller more smoker. History of lung cancer. EXAM: PORTABLE CHEST 1 VIEW COMPARISON:  11/20/2018; 11/18/2018; chest CT-06/09/2018 FINDINGS: Grossly unchanged enlarged cardiac silhouette and mediastinal contours with nodularity of the bilateral pulmonary hila. The pulmonary vasculature remains indistinct with cephalization of flow. No change to slight increase in small to moderate-sized right-sided effusion with right basilar heterogeneous/consolidative opacities. Unchanged ill-defined nodular opacity within the left upper lung measuring approximately 2.2 x 2.2 cm. No new  focal airspace opacities. No pneumothorax. No acute osseous abnormalities. IMPRESSION: 1. Similar findings of suspected pulmonary edema with no change to slight increase in size of small to moderate-sized effusion and associated  right basilar opacities, atelectasis versus infiltrate. 2. Similar findings compatible provided history of lung cancer including grossly unchanged approximately 2.2 cm left upper lobe nodule. Electronically Signed   By: Sandi Mariscal M.D.   On: 11/24/2018 09:04    EKG:   Orders placed or performed during the hospital encounter of 11/18/18  . EKG 12-Lead  . EKG 12-Lead  . EKG  . EKG 12-Lead  . EKG 12-Lead  . EKG 12-Lead  . EKG 12-Lead    ASSESSMENT AND PLAN:   63 year old male with past medical history significant for COPD, prior alcohol use, history of lung carcinoma and squamous cell carcinoma of the nasopharynx admitted to hospital secondary to acute congestive heart failure.  1.  Acute systolic heart failure with reduced ejection fraction-nonischemic cardiomyopathy -Appreciate cardiology consult. -Patient has been in ICU on milrinone drip.  Diuresing well. -Patient had right heart cath and left heart cath done this admission showing nonobstructive CAD  -Based on blood pressure improvement, beta-blocker and enalapril started-given IV as patient n.p.o. -IV Lasix has been started today -Plan to wean off milrinone today pending renal function.  2.  Acute renal failure-concern for cardiorenal syndrome. -Improving on milrinone infusion -Appreciate nephrology consult. -10 L net negative since admission  3.  Possible aspiration pneumonia-on Unasyn -Continues to have coarse gurgling breath sounds.  Failed swallow evaluation.  Remains n.p.o.  Family discussing about comfort care versus feeding tube placement -Normal WBC count, on 2 L oxygen -Also on steroids  4.  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  DVT  prophylaxis-on Lovenox  Appreciate palliative care input-overall poor prognosis.  Palliative to discuss with family again regarding disposition plans.  All the records are reviewed and case discussed with Care Management/Social Workerr. Management plans discussed with the patient, family and they are in agreement.  CODE STATUS: Full code  TOTAL TIME TAKING CARE OF THIS PATIENT: 33 minutes.   POSSIBLE D/C IN ? DAYS, DEPENDING ON CLINICAL CONDITION.   Gladstone Lighter M.D on 11/25/2018 at 10:26 AM  Between 7am to 6pm - Pager - [phone]  After 6pm go to [email or web address] - password [email or web address] [place] [facility]  Office  [phone]  CC: Primary care physician; Cletis Athens, MD

## 2018-11-25 NOTE — Progress Notes (Signed)
Patient and family are now in agreeance for patient to be transferred to [facility]. Hinton Dyer NP is at bedside speaking with patient and wife. Hinton Dyer will contact [facility].

## 2018-11-25 NOTE — Care Plan (Signed)
After further assessment and discussion of patient's  status and medical condition during multidisciplinary rounds, the plan is outlined as below:  1.  Being weaned off of milrinone by cardiology.  Lasix infusion.  He has cardiorenal syndrome.  LVEF less than 20%.  2.  Remains n.p.o. due to frank aspiration.  Does not want feeding tube nor gastrostomy tube.  Goals of care discussion ongoing.  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  3.  Hospice recommended given patient's multiple issues to include nonischemic cardiomyopathy with left ventricular ejection fraction less than 20%, cardiorenal syndrome, dysphagia as noted above.  Most recent imaging consistent with likely metastatic disease to lung bilaterally.  Prognosis long-term poor.  Discussed with palliative care.  Renold Don, MD [place] PCCM

## 2018-11-25 NOTE — Progress Notes (Signed)
Speech Therapy note: reviewed chart notes noting MD notes describing "Audible rales,thick cough; More coarse and gurgling breath sounds today. Patient remains n.p.o. as he failed swallow evaluation. However eating ice chips" (pt/wife's request). Discussed the findings of the MBSS w/ tx team this morning during rounds; recommendation for NPO status at this time in light of pt's FULL CODE status. Pt is at high risk for aspiration of oral intake thus potential impact on Pulmonary status including decline of Pulmonary status. Recommend further assessment/discussion w/ MDs re: pt's dysphagia (potential effects from XRT) for better understanding for decision making during [place]; discussion of options including alternative means of feeding to safely meet nutritional needs. Recommend frequent oral care for hygiene and stimulation of swallowing. ST services will be available for further education as needed. Potential f/u post discharge for ongoing ed/tx re: dysphagia. MD agreed.     Orinda Kenner, [place], CCC-SLP

## 2018-11-25 NOTE — Consult Note (Signed)
Pharmacy Antibiotic Note  Jason Bryan is a 63 y.o. male admitted on 11/18/2018 with Respiratory Failure. Pharmacy has been consulted for Unasyn dosing for aspiration PNA.  Today is day 5/5 of antibiotic therapy for aspiration PNA will stop after 20 doses. Patient is afebrile.  Plan: Continue Unasyn 3g IV every 6 hours   Height: 5\' 6"  (167.6 cm) Weight: 170 lb 6.7 oz (77.3 kg) IBW/kg (Calculated) : 63.8  Temp (24hrs), Avg:98.1 F (36.7 C), Min:97.6 F (36.4 C), Max:99 F (37.2 C)  Recent Labs  Lab 11/18/18 1200  11/20/18 0516 11/20/18 1542 11/20/18 2106 11/22/18 0706 11/23/18 0541 11/24/18 0321  WBC 8.7  --  11.3*  --   --  8.9  --   --   CREATININE 1.17   < > 1.01 0.85 1.01 0.69 0.67 0.77   < > = values in this interval not displayed.    Estimated Creatinine Clearance: 90.1 mL/min (by C-G formula based on SCr of 0.77 mg/dL).    No Known Allergies  Antimicrobials this admission: 9/2 Azithromycin >> 9/6 9/5 Unasyn  >>   Microbiology results: 9/3 MRSA PCR: negative   Thank you for allowing pharmacy to be a part of this patient's care.  [place] Resident 11/25/2018 9:06 AM

## 2018-11-25 NOTE — Progress Notes (Signed)
Spoke with pt and pts wife they both stated they would like the pt transferred to [facility].  The pt is upset he cannot have anything to eat or drink per speech therapy recommendations due to failed swallowing evaluation.  Mr. Swaim thought if he were transferred to [facility] he would be able to eat and drink fluids.  I informed Mr. Buehner due to aspiration risks he would likely remain NPO if he were accepted and transferred to [place] wife stated she would still like the pt transferred to [facility] or [facility] for a second opinion.  Notified pts Cardiologist Dr. Rockey Situ regarding wife's request and acknowledged this.  Will continue to monitor and assess pt.   Marda Stalker, [place] Pager [phone] (please enter 7 digits) PCCM Consult Pager [phone] (please enter 7 digits)

## 2018-11-25 NOTE — Progress Notes (Addendum)
Daily Progress Note   Patient Name: Jason Bryan       Date: 11/25/2018 DOB: Sep 08, 1955  Age: 63 y.o. MRN#: [id number] Attending Physician: Gladstone Lighter, MD Primary Care Physician: Cletis Athens, MD Admit Date: 11/18/2018  Reason for Consultation/Follow-up: Establishing goals of care  Subjective:  Patient sitting up in bed. He is answering questions and appropriate today initially in our meeting, but then asks questions later demonstrating confusion. He states he wants to go home. He states he wants to be comfortable. He states "my wife listens to my daughter and son too much." He would like his family to be supportive of his wishes. Attempted to call wife unsuccessfully. Per staff, son called to speak with CCM to discuss desire for transfer to a different facility.   ADDENDUM: Wife to bedside. She is not sure what to do. She is on the fence about comfort care, but states her son wants him moved to [place]. Son called during our conversation. He was on speakerphone. He was yelling and cursing. States he does not want to talk with any provider here. He was very angry and wanted his father moved. Wife hung up with him as to not upset the patient. Patient states "that is the only way he knows o deal with anything", and would like to go home, he states his son does not come to see them. Wife stepped off the floor to go outside. Patient will need support to go home with hospice.   Length of Stay: 7  Current Medications: Scheduled Meds:  . Chlorhexidine Gluconate Cloth  6 each Topical Daily  . enalaprilat  0.625 mg Intravenous Q6H  . enoxaparin (LOVENOX) injection  40 mg Subcutaneous QHS  . furosemide  40 mg Intravenous Daily  . ipratropium-albuterol  3 mL Nebulization TID  . methylPREDNISolone  (SOLU-MEDROL) injection  40 mg Intravenous Q12H  . metoprolol tartrate  2.5 mg Intravenous Q8H  . sodium chloride flush  3 mL Intravenous Q12H    Continuous Infusions: . ampicillin-sulbactam (UNASYN) IV 3 g (11/25/18 0731)    PRN Meds: albuterol, clonazepam, ipratropium-albuterol  Physical Exam Pulmonary:     Effort: Pulmonary effort is normal.  Skin:    General: Skin is warm and dry.  Neurological:     Mental Status: He is alert.  Vital Signs: BP 123/79   Pulse 72   Temp 98 F (36.7 C) (Oral)   Resp 13   Ht 5\' 6"  (1.676 m)   Wt 77.3 kg   SpO2 96%   BMI 27.51 kg/m  SpO2: SpO2: 96 % O2 Device: O2 Device: Nasal Cannula O2 Flow Rate: O2 Flow Rate (L/min): 2 L/min  Intake/output summary:   Intake/Output Summary (Last 24 hours) at 11/25/2018 1338 Last data filed at 11/25/2018 1205 Gross per 24 hour  Intake 506.78 ml  Output 2350 ml  Net -1843.22 ml   LBM: Last BM Date: 11/23/18 Baseline Weight: Weight: 80.7 kg Most recent weight: Weight: 77.3 kg       Palliative Assessment/Data:      Patient Active Problem List   Diagnosis Date Noted  . Dilated cardiomyopathy ([place]) 11/23/2018  . Encephalopathy 11/23/2018  . Bilateral lower extremity edema   . CHF exacerbation ([place])   . COPD exacerbation ([place])   . Acute CHF (congestive heart failure) ([place]) 11/18/2018  . Goals of care, counseling/discussion 06/19/2018  . Weight loss 12/06/2017  . Insomnia 11/16/2015  . Lung cancer, upper lobe ([place]) 04/11/2015  . 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 Squamous cell carcinoma of nasopharynx ([facility]) 02/15/2010    Palliative Care Assessment & Plan   Recommendations/Plan:  Full code/full scope Considering home with hospice.     Code Status:    Code Status Orders  (From admission, onward)         Start     Ordered   11/22/18 1858  Full code  Continuous     11/22/18 1858        Code Status  History    This patient has a current code status but no historical code status.   Advance Care Planning Activity       Prognosis:  Poor overall. < 6 months.    Discharge Planning:  Home with [place] was discussed with RN  Thank you for allowing the Palliative Medicine Team to assist in the care of this patient.   Total Time 15 min 1:30-2:45  90 min Prolonged Time Billed yes      Greater than 50%  of this time was spent counseling and coordinating care related to the above assessment and plan.  Asencion Gowda, NP  Please contact Palliative Medicine Team phone at [phone] for questions and concerns.

## 2018-11-25 NOTE — Progress Notes (Signed)
Progress Note  Patient Name: Jason Bryan Date of Encounter: 11/25/2018  Primary Cardiologist: [facility], Dr. Rockey Situ  Subjective   No complaints this morning, appears comfortable Audible rales, thick cough Sucking on ice chips  1.8 L negative, 12.25 L negative to date On low-dose milrinone infusion   Inpatient Medications    Scheduled Meds:  Chlorhexidine Gluconate Cloth  6 each Topical Daily   enalaprilat  0.625 mg Intravenous Q6H   enoxaparin (LOVENOX) injection  40 mg Subcutaneous QHS   furosemide  40 mg Intravenous Daily   ipratropium-albuterol  3 mL Nebulization TID   methylPREDNISolone (SOLU-MEDROL) injection  40 mg Intravenous Q12H   metoprolol tartrate  2.5 mg Intravenous Q8H   sodium chloride flush  3 mL Intravenous Q12H   Continuous Infusions:  ampicillin-sulbactam (UNASYN) IV 3 g (11/25/18 0731)   PRN Meds: albuterol, clonazepam, ipratropium-albuterol   Vital Signs    Vitals:   11/25/18 1000 11/25/18 1100 11/25/18 1200 11/25/18 1300  BP:  114/74 119/75 123/79  Pulse: 65 65 69 72  Resp: (!) 25 (!) 22 17 13   Temp:      TempSrc:      SpO2: 92% 93% 96% 96%  Weight:      Height:        Intake/Output Summary (Last 24 hours) at 11/25/2018 1337 Last data filed at 11/25/2018 1205 Gross per 24 hour  Intake 506.78 ml  Output 2350 ml  Net -1843.22 ml   Last 3 Weights 11/25/2018 11/24/2018 11/23/2018  Weight (lbs) 170 lb 6.7 oz 171 lb 1.2 oz 175 lb 0.7 oz  Weight (kg) 77.3 kg 77.6 kg 79.4 kg      Telemetry    Normal sinus rhythm- Personally Reviewed  ECG    No new tracings today - Personally Reviewed  Physical Exam   Constitutional:  oriented to person, place, and time. No distress.  HENT:  Head: Normocephalic and atraumatic.  Eyes:  no discharge. No scleral icterus.  Neck: Normal range of motion. Neck supple. No JVD present.  Cardiovascular: Normal rate, regular rhythm, normal heart sounds and intact distal pulses. Exam reveals no gallop  and no friction rub. No edema No murmur heard. Pulmonary/Chest: coarse breath sounds, rales Abdominal: Soft.  no distension.  no tenderness.  Musculoskeletal: Normal range of motion.  no  tenderness or deformity.  Neurological:  normal muscle tone. Coordination normal. No atrophy Skin: Skin is warm and dry. No rash noted. not diaphoretic.  Psychiatric:  normal mood and affect. behavior is normal. Thought content normal.    Labs    High Sensitivity Troponin:   Recent Labs  Lab 11/18/18 1200 11/18/18 1741  TROPONINIHS 45* 42*      Cardiac EnzymesNo results for input(s): TROPONINI in the last 168 hours. No results for input(s): TROPIPOC in the last 168 hours.   Chemistry Recent Labs  Lab 11/23/18 0541 11/24/18 0321 11/25/18 0939  NA 140 142 139  K 3.8 3.5 3.8  CL 88* 90* 93*  CO2 43* 43* 34*  GLUCOSE 157* 133* 132*  BUN 33* 37* 37*  CREATININE 0.67 0.77 0.60*  CALCIUM 8.4* 8.5* 8.5*  GFRNONAA >60 >60 >60  GFRAA >60 >60 >60  ANIONGAP 9 9 12      Hematology Recent Labs  Lab 11/20/18 0516 11/22/18 0706  WBC 11.3* 8.9  RBC 5.50 5.29  HGB 15.6 15.0  HCT 47.6 44.7  MCV 86.5 84.5  MCH 28.4 28.4  MCHC 32.8 33.6  RDW 16.9* 16.5*  PLT 251 182    BNP No results for input(s): BNP, PROBNP in the last 168 hours.   DDimer No results for input(s): DDIMER in the last 168 hours.   Radiology    Dg Chest Port 1 View  Result Date: 11/24/2018 CLINICAL DATA:  Admitted with respiratory failure. Smaller more smoker. History of lung cancer. EXAM: PORTABLE CHEST 1 VIEW COMPARISON:  11/20/2018; 11/18/2018; chest CT-06/09/2018 FINDINGS: Grossly unchanged enlarged cardiac silhouette and mediastinal contours with nodularity of the bilateral pulmonary hila. The pulmonary vasculature remains indistinct with cephalization of flow. No change to slight increase in small to moderate-sized right-sided effusion with right basilar heterogeneous/consolidative opacities. Unchanged ill-defined  nodular opacity within the left upper lung measuring approximately 2.2 x 2.2 cm. No new focal airspace opacities. No pneumothorax. No acute osseous abnormalities. IMPRESSION: 1. Similar findings of suspected pulmonary edema with no change to slight increase in size of small to moderate-sized effusion and associated right basilar opacities, atelectasis versus infiltrate. 2. Similar findings compatible provided history of lung cancer including grossly unchanged approximately 2.2 cm left upper lobe nodule. Electronically Signed   By: Sandi Mariscal M.D.   On: 11/24/2018 09:04    Cardiac Studies   [facility] 11/18/2018  Coronary angiography:   Right heart pressures RA mean 20 RV 40/10 mean 22 PA 40/24 mean 30 Wedge pressure mean 16 Cardiac output 3.69 Cardiac index 1.93 Nonobstructive coronary disease Mildly elevated right heart pressures Severely reduced LV function Echocardiogram estimate less than 20%  Echo 11/18/2018  1. The left ventricle has severely reduced systolic function, with an ejection fraction of <20%. The cavity size was moderately dilated. Left ventricular diastolic Doppler parameters are indeterminate. Left ventricular diffuse hypokinesis.  2. The right ventricle has mildly reduced systolic function. The cavity was mildly enlarged. There is no increase in right ventricular wall thickness. Right ventricular systolic pressure is mildly elevated with an estimated pressure of 36.1 mmHg.  3. Left atrial size was mildly dilated.  4. Mitral valve regurgitation is moderate  Patient Profile     63 y.o. male with a h/o squamous cell carcinoma of the nasopharynx, lung carcinoma, prior smoking history (40 pk yr), prior alcohol abuse, and COPD admitted with acute systolic CHF.  Assessment & Plan     A/P: Dilated cardiomyopathy, nonischemic -long etoh hx, patient not forthcoming about whether he has been drinking recently  Cardiac catheterization, no obstructive disease, EF <20% Milrinone  started post procedure for low output failure, hypotension Initially required pressors for blood pressure support, this has been weaned off Milrinone infusion being weaned down Heart medications held yesterday evening for hypotension systolic pressure 62-37 systolic -He has had aggressive diuresis 12 L negative --Could hold milrinone Continue Coreg losartan with IV Lasix daily  Acute renal failure cardiorenal syndrome Renal function improved though Lasix weaned and milrinone weaned given bump in BUN yesterday Lab work today pending We will hold the milrinone, continue other medications  Lung carcinoma Previously treated with radiation Contributing to shortness of breath  COPD Severe 40.00 pack-year smoking history Stopped 63 yo ICU teamassisting with inhalers and pulmonary management Breathing is worse today compared to over the weekend, likely from aspiration events  ETOH,  stopped per the notes and patient, in 04/2009? Unclear if he still drinks Cardiomyopathy concerning for alcohol.  Nonischemic  Confusion Poor status at baseline, slight improvement today Choking episode over the weekend with hypoxia, likely aspiration with subsequent confusion  -Seem to improve but repeated aspiration events Now with coarse breath  sounds -N.p.o.   Total encounter time more than 25 minutes Greater than 50% was spent in counseling and coordination of care with the patient       Signed, Ida Rogue, MD  11/25/2018, 1:37 PM

## 2018-11-26 ENCOUNTER — Inpatient Hospital Stay: Payer: Medicare Other

## 2018-11-26 DIAGNOSIS — G9340 Encephalopathy, unspecified: Secondary | ICD-10-CM

## 2018-11-26 LAB — BASIC METABOLIC PANEL
Anion gap: 9 (ref 5–15)
BUN: 39 mg/dL — ABNORMAL HIGH (ref 8–23)
CO2: 32 mmol/L (ref 22–32)
Calcium: 8.5 mg/dL — ABNORMAL LOW (ref 8.9–10.3)
Chloride: 99 mmol/L (ref 98–111)
Creatinine, Ser: 0.55 mg/dL — ABNORMAL LOW (ref 0.61–1.24)
GFR calc Af Amer: >60 mL/min (ref >60)
GFR calc non Af Amer: >60 mL/min (ref >60)
Glucose, Bld: 94 mg/dL (ref 70–99)
Potassium: 3.3 mmol/L — ABNORMAL LOW (ref 3.5–5.1)
Sodium: 140 mmol/L (ref 135–145)

## 2018-11-26 LAB — CBC
HCT: 45.3 % (ref 39.0–52.0)
Hemoglobin: 15.9 g/dL (ref 13.0–17.0)
MCH: 28.3 pg (ref 26.0–34.0)
MCHC: 35.1 g/dL (ref 30.0–36.0)
MCV: 80.6 fL (ref 80.0–100.0)
Platelets: 188 10*3/uL (ref 150–400)
RBC: 5.62 MIL/uL (ref 4.22–5.81)
RDW: 16.3 % — ABNORMAL HIGH (ref 11.5–15.5)
WBC: 11.7 10*3/uL — ABNORMAL HIGH (ref 4.0–10.5)
nRBC: 0 % (ref 0.0–0.2)

## 2018-11-26 LAB — BRAIN NATRIURETIC PEPTIDE: B Natriuretic Peptide: 1676 pg/mL — ABNORMAL HIGH (ref 0.0–100.0)

## 2018-11-26 MED ORDER — PRO-STAT SUGAR FREE PO LIQD: 30.0000 mL | Freq: Every day | ORAL | Status: DC

## 2018-11-26 MED ORDER — OSMOLITE 1.5 CAL PO LIQD: 1000.0000 mL | ORAL | Status: DC

## 2018-11-26 MED ORDER — POTASSIUM CHLORIDE 10 MEQ/100ML IV SOLN
10.0000 meq | INTRAVENOUS | Status: AC
  Administered 2018-11-26 (×4): 10 meq via INTRAVENOUS
  Filled 2018-11-26 (×4): qty 100

## 2018-11-26 MED ORDER — FREE WATER: 80.0000 mL | Status: DC

## 2018-11-26 MED ORDER — POTASSIUM CHLORIDE CRYS ER 20 MEQ PO TBCR: 40.0000 meq | EXTENDED_RELEASE_TABLET | Freq: Once | ORAL | Status: DC

## 2018-11-26 NOTE — Evaluation (Signed)
Physical Therapy Evaluation Patient Details Name: Jason Bryan MRN: [id number] DOB: 07-12-1955 Today's Date: 11/26/2018   History of Present Illness  63 year old male with past medical history significant for COPD, prior alcohol use, history of lung carcinoma and squamous cell carcinoma of the nasopharynx admitted to hospital secondary to acute congestive heart failure.  Clinical Impression  Pt initially hesitant to do a lot with PT as he feels weak from not being up in a week and not eating, also frustrated because of the overall situation. However he did better than he and this PT expected and was able to get to EOB, standing and then walk ~100 ft w/o AD.  Admittedly he had some unsteadiness and at times needed single HHA (PT recommending getting RW for home).  Pt on 2L on arrival, sats ~100%, per nursing removed O2, stayed in high 90s during testing on room air and only slowly dropped to high 80s with 100 ft of ambulation.  Overall pt did relatively well with PT exam and from PT perspective is safe to return home with family.     Follow Up Recommendations Home health PT    Equipment Recommendations  Rolling walker with 5" wheels    Recommendations for Other Services       Precautions / Restrictions Precautions Precautions: Fall Restrictions Weight Bearing Restrictions: No      Mobility  Bed Mobility Overal bed mobility: Independent             General bed mobility comments: Pt was able to get himself to sitting EOB w/o direct assist, needed UEs on rail   Transfers Overall transfer level: Modified independent               General transfer comment: Pt was able to rise to standing with cuing for hand placement and sequencing.  Showed good overall confidence given he had not been up in a week  Ambulation/Gait Ambulation/Gait assistance: Supervision Gait Distance (Feet): 100 Feet Assistive device: None       General Gait Details: Pt was able to maintain slow and  labored ambulation, but did better than he and this PT expected.  He was on room air t/o the effort with sats only slowly dropping to high 80s.  He had some unsteadiness and at times lacked confidence but was eager to do as much as he could. Pt did need PT's HHA at times but with slow and cautious gait did not have any overt LOBs  Stairs            Wheelchair Mobility    Modified Rankin (Stroke Patients Only)       Balance                                             Pertinent Vitals/Pain Pain Assessment: No/denies pain    Home Living Family/patient expects to be discharged to:: Private residence Living Arrangements: Spouse/significant other Available Help at Discharge: Family   Home Access: Stairs to enter   Technical brewer of Steps: 5 Home Layout: One level Home Equipment: None      Prior Function Level of Independence: Independent         Comments: Per wife he was not out of the home much, but independent with all activity until recently     Hand Dominance        Extremity/Trunk Assessment  Upper Extremity Assessment Upper Extremity Assessment: Generalized weakness(R grossly 4/5, L 3+.5)    Lower Extremity Assessment Lower Extremity Assessment: Overall WFL for tasks assessed       Communication   Communication: No difficulties  Cognition Arousal/Alertness: Awake/alert Behavior During Therapy: WFL for tasks assessed/performed Overall Cognitive Status: Within Functional Limits for tasks assessed                                        General Comments      Exercises     Assessment/Plan    PT Assessment Patient needs continued PT services  PT Problem List Decreased strength;Decreased range of motion;Decreased activity tolerance;Decreased balance;Decreased mobility;Decreased coordination;Decreased knowledge of use of DME;Decreased safety awareness;Cardiopulmonary status limiting activity       PT  Treatment Interventions DME instruction;Gait training;Stair training;Functional mobility training;Therapeutic activities;Therapeutic exercise;Balance training;Neuromuscular re-education;Patient/family education    PT Goals (Current goals can be found in the Care Plan section)  Acute Rehab PT Goals Patient Stated Goal: go home PT Goal Formulation: With patient Time For Goal Achievement: 12/10/18 Potential to Achieve Goals: Good    Frequency Min 2X/week   Barriers to discharge        Co-evaluation               AM-PAC PT "6 Clicks" Mobility  Outcome Measure Help needed turning from your back to your side while in a flat bed without using bedrails?: None Help needed moving from lying on your back to sitting on the side of a flat bed without using bedrails?: None Help needed moving to and from a bed to a chair (including a wheelchair)?: A Little Help needed standing up from a chair using your arms (e.g., wheelchair or bedside chair)?: A Little Help needed to walk in hospital room?: A Little Help needed climbing 3-5 steps with a railing? : A Lot 6 Click Score: 19    End of Session Equipment Utilized During Treatment: Gait belt Activity Tolerance: Patient tolerated treatment well;Patient limited by fatigue Patient left: in chair;with call bell/phone within reach;with family/visitor present Nurse Communication: Mobility status PT Visit Diagnosis: Muscle weakness (generalized) (M62.81);Difficulty in walking, not elsewhere classified (R26.2)    Time: 1694-5038 PT Time Calculation (min) (ACUTE ONLY): 33 min   Charges:   PT Evaluation $PT Eval Moderate Complexity: 1 Mod PT Treatments $Gait Training: 8-22 mins        Kreg Shropshire, DPT 11/26/2018, 6:38 PM

## 2018-11-26 NOTE — Progress Notes (Deleted)
   Patient ID: Jason Bryan, male    DOB: [birth year], 63 y.o.   MRN: [id number]  HPI  Jason Bryan is a 63 y/o male with a history of  Echo report from 11/18/2018 reviewed and showed an EF of <20%, moderate Jason and a PA pressure of 36.1 mmHg.   Catheterization done 11/19/2018 reviewed and showed: Nonobstructive coronary disease Mildly elevated right heart pressures Severely reduced LV function Echocardiogram estimate less than 20%  Admitted  Review of Systems    Physical Exam  Assessment & Plan:  1: Chronic heart failure with reduced ejection fraction- - NYHA class

## 2018-11-26 NOTE — Progress Notes (Signed)
Initial Nutrition Assessment  DOCUMENTATION CODES:   Not applicable  INTERVENTION:   Once NG tube in place, recommend:  Osmolite 1.5 @55ml /hr- Initiate at 75ml/hr and increase by 70ml/hr q 8 hours until goal rate is reached  Prostat liquid protein 30 daily via tube, each supplement provides 100 kcal, 15 grams protein.  Free water flushes 10ml q4 hours.   Regimen provides 2080kcal/day, 98g/day protein, 1475ml/day free water    NUTRITION DIAGNOSIS:   Inadequate oral intake related to dysphagia as evidenced by NPO status.  GOAL:   Patient will meet greater than or equal to 90% of their needs  MONITOR:   Labs, Weight trends, Skin, I & O's, TF tolerance  REASON FOR ASSESSMENT:   Consult Enteral/tube feeding initiation and management  ASSESSMENT:   63 year old male with past medical history significant for COPD, prior alcohol use, history of lung carcinoma and squamous cell carcinoma of the nasopharynx admitted to hospital secondary to acute congestive heart failure.  RD working remotely.  Per chart review, pt admitted with leg swelling secondary to CHF. Pt with dysphagia secondary to radiation therapy. Pt was seen by SLP 9/4 and placed a dysphagia 1/thin diet; pt was downgraded to a nectar thick diet on 9/7 after apparently aspirating. Pt was eating 50-100% of meals on the dysphagia 1 diet. Pt had MBSS 9/8 and determined to be at severe aspiration risk. Pt has been NPO since 9/7 and family now requesting nutrition support. Family requesting transfer to [facility] or [facility] which was declined. Palliative care following for GOC; family requesting aggressive care for now. Plan is for NGT placement and tube feeds. Pt would likely need G-tube placement if family wishes to continue full aggressive care.   Per chart, pt with 16lb(9%) weight loss in < 3 months; RD unsure how recently weight loss occurred.   Medications reviewed and include: lovenox, lasix, KCl  Labs reviewed: K 3.3(L), BUN  39(H), creat 0.55(L) P 2.7 wnl- 9/8 Mg 2.2 wnl- 9/9 BNP 1676(H)- 9/10 Wbc- 11.7(H)  Unable to complete Nutrition-Focused physical exam at this time.   Diet Order:   Diet Order            Diet NPO time specified  Diet effective now             EDUCATION NEEDS:   Not appropriate for education at this time  Skin:  Skin Assessment: Reviewed RN Assessment(ecchymosis, MASD)  Last BM:  9/7  Height:   Ht Readings from Last 1 Encounters:  11/19/18 5\' 6"  (1.676 m)    Weight:   Wt Readings from Last 1 Encounters:  11/26/18 74.5 kg    Ideal Body Weight:  64.5 kg  BMI:  Body mass index is 26.51 kg/m.  Estimated Nutritional Needs:   Kcal:  1800-2100kcal/day  Protein:  90-105g/day  Fluid:  1.8L/day  Koleen Distance MS, RD, LDN Pager #- [phone] Office#- [phone] After Hours Pager: [phone]

## 2018-11-26 NOTE — Progress Notes (Addendum)
Progress Note  Patient Name: Jason Bryan Date of Encounter: 11/26/2018  Primary Cardiologist: Ida Rogue, MD   Subjective   Patient denied any chest pain, shortness of breath, racing heart rate, or palpitations.  Of note, he does not appear completely oriented this morning, which may have influenced the accuracy of today's ROS.  Inpatient Medications    Scheduled Meds: . Chlorhexidine Gluconate Cloth  6 each Topical Daily  . enalaprilat  0.625 mg Intravenous Q6H  . enoxaparin (LOVENOX) injection  40 mg Subcutaneous QHS  . furosemide  40 mg Intravenous Daily  . ipratropium-albuterol  3 mL Nebulization TID  . metoprolol tartrate  2.5 mg Intravenous Q8H  . sodium chloride flush  3 mL Intravenous Q12H   Continuous Infusions: . potassium chloride 10 mEq (11/26/18 1111)   PRN Meds: albuterol, clonazepam   Vital Signs    Vitals:   11/26/18 0800 11/26/18 0900 11/26/18 1000 11/26/18 1100  BP: 109/70 106/68  115/71  Pulse: 79 70 66 68  Resp: (!) 24 16 (!) 25 (!) 25  Temp: 98.6 F (37 C)     TempSrc: Oral     SpO2: 99% 99% 96% 100%  Weight:      Height:        Intake/Output Summary (Last 24 hours) at 11/26/2018 1120 Last data filed at 11/26/2018 1034 Gross per 24 hour  Intake 427.66 ml  Output 2600 ml  Net -2172.34 ml   Last 3 Weights 11/26/2018 11/25/2018 11/24/2018  Weight (lbs) 164 lb 3.9 oz 170 lb 6.7 oz 171 lb 1.2 oz  Weight (kg) 74.5 kg 77.3 kg 77.6 kg      Telemetry    NSR 60s, NSVT for 7 beats - Personally Reviewed  ECG    No new tracings - Personally Reviewed  Physical Exam   GEN: No acute distress.  Lying in bed. Neck: No JVD Cardiac: RRR, no murmurs, rubs, or gallops.  Respiratory: Coarse bilateral breath sounds, rales GI: Soft, nontender, non-distended  MS: No edema; No deformity. Neuro:  Not completely oriented to place and others Psych: Normal affect   Labs    High Sensitivity Troponin:   Recent Labs  Lab 11/18/18 1200 11/18/18  1741  TROPONINIHS 45* 42*      Cardiac EnzymesNo results for input(s): TROPONINI in the last 168 hours. No results for input(s): TROPIPOC in the last 168 hours.   Chemistry Recent Labs  Lab 11/24/18 0321 11/25/18 0939 11/26/18 0439  NA 142 139 140  K 3.5 3.8 3.3*  CL 90* 93* 99  CO2 43* 34* 32  GLUCOSE 133* 132* 94  BUN 37* 37* 39*  CREATININE 0.77 0.60* 0.55*  CALCIUM 8.5* 8.5* 8.5*  GFRNONAA >60 >60 >60  GFRAA >60 >60 >60  ANIONGAP 9 12 9      Hematology Recent Labs  Lab 11/20/18 0516 11/22/18 0706 11/26/18 0439  WBC 11.3* 8.9 11.7*  RBC 5.50 5.29 5.62  HGB 15.6 15.0 15.9  HCT 47.6 44.7 45.3  MCV 86.5 84.5 80.6  MCH 28.4 28.4 28.3  MCHC 32.8 33.6 35.1  RDW 16.9* 16.5* 16.3*  PLT 251 182 188    BNP Recent Labs  Lab 11/26/18 0439  BNP 1,676.0*     DDimer No results for input(s): DDIMER in the last 168 hours.   Radiology    No results found.  Cardiac Studies   2D Echo 11/18/2018: 1. The left ventricle has severely reduced systolic function, with an ejection fraction of <20%.  The cavity size was moderately dilated. Left ventricular diastolic Doppler parameters are indeterminate. Left ventricular diffuse hypokinesis. 2. The right ventricle has mildly reduced systolic function. The cavity was mildly enlarged. There is no increase in right ventricular wall thickness. Right ventricular systolic pressure is mildly elevated with an estimated pressure of 36.1 mmHg. 3. Left atrial size was mildly dilated. 4. Mitral valve regurgitation is moderate __________  [facility] 11/19/2018: Coronary angiography:  Coronary dominance: Right  Left mainstem: Large vessel that bifurcates into the LAD and left circumflex, no significant disease noted  Left anterior descending (LAD): Large vessel that extends to the apical region, diagonal branch 2 of moderate size, no significant disease noted  Left circumflex (LCx): Large vessel with OM branch 2, no significant  disease noted  Right coronary artery (RCA): Right dominant vessel with PL and PDA, no significant disease noted  Left ventriculography: Left ventricular systolic function is normal, LVEF is estimated at 55-65%, there is no significant mitral regurgitation , no significant aortic valve stenosis  Right heart pressures RA mean 20 RV 40/10 mean 22 PA 40/24 mean 30 Wedge pressure mean 16 Cardiac output 3.69 Cardiac index 1.93  Final Conclusions:  Nonobstructive coronary disease Mildly elevated right heart pressures Severely reduced LV function Echocardiogram estimate less than 20%  Recommendations:  Medical management recommended For now would continue carvedilol 3.125 twice daily with losartan 25 daily Hold diltiazem Lasix 20 IV twice daily  Patient Profile     63 y.o. 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  Assessment & Plan    Acute dilated HFrEF secondary to NICM --Long history of EtOH abuse.  R/LHC this admission showed nonobstructive CAD with right heart cath results as above.  EF <20%. --Transferred to the ICU 9/3 for previous milrinone infusion, as well as briefly required pressor support for hypotension. Started on Coreg 9/6 and losartan 9/7 with BP ranging from 80s to low 161W systolic. Coreg and losartan have been discontinued since that time due to hypotension and instead placed on PRN lopressor as tolerated by BP. On review of EMR, appears successfully weaned off milrinone on 9/8. --Recommend restart of evidence based heart failure therapy as tolerated by BP, including previous BB and ARB. Escalate evidence based therapy as pressures tolerate. Today's pressures soft but improved from previous.  --Continue diuresis with IV Lasix 40 mg daily and potassium supplementation. BNP today 1,676.0. -14.2L for admission and -1.5L yesterday. Wt  77.3kg  74.5kg.   --Palliative care has been consulted for goals of care.   Hypokalemia --K 3.3. Replete with goal 4.0. --Check Mg.  --Daily BMET.  ARF/cardiorenal syndrome --Daily BMET. Cr improving with Cr 0.60  0.55 today.  COPD/lung carcinoma --40 pk year history of tobacco abuse. S/p radiation theapy. Follow-up with pulmonology and oncology as indicated.  EtOH abuse --Reportedly quit 2014.  For questions or updates, please contact [place] Please consult [email or web address] for contact info under        Signed, Arvil Chaco, PA-C  11/26/2018, 11:20 AM

## 2018-11-26 NOTE — Progress Notes (Addendum)
Daily Progress Note   Patient Name: Jason Bryan       Date: 11/26/2018 DOB: 09/12/1955  Age: 63 y.o. MRN#: [id number] Attending Physician: Gladstone Lighter, MD Primary Care Physician: Cletis Athens, MD Admit Date: 11/18/2018  Reason for Consultation/Follow-up: Establishing goals of care  Subjective:  Patient sitting up in bed. Wife at bedside. Wife is upset that he has not had nutrition in 2 days. Discussed that he is not comfort care and is not cleared to eat or drink. She states she is concerned that he does not understand what hospice at home means. Patient states "they come help take care of you when you are at the end of your life." Asked what happens at the end of your life, he states "you pass away." Yesterday he stated " I am not afraid of death."   Wife states she wants him evaluated at [facility] as this will bring peace for the family including the children, to know there are no other options. She states she knows his heart cannot be fixed, but would like a second opinion on the swallowing. She states if nothing can be done, she will take him home with hospice.  She states they are amenable to a temporary feeding tube.    Length of Stay: 8  Current Medications: Scheduled Meds:  . Chlorhexidine Gluconate Cloth  6 each Topical Daily  . enalaprilat  0.625 mg Intravenous Q6H  . enoxaparin (LOVENOX) injection  40 mg Subcutaneous QHS  . [START ON 11/27/2018] feeding supplement (PRO-STAT SUGAR FREE 64)  30 mL Per Tube Daily  . free water  80 mL Per Tube Q4H  . furosemide  40 mg Intravenous Daily  . ipratropium-albuterol  3 mL Nebulization TID  . metoprolol tartrate  2.5 mg Intravenous Q8H  . sodium chloride flush  3 mL Intravenous Q12H    Continuous Infusions: . feeding supplement  (OSMOLITE 1.5 CAL)    . potassium chloride 10 mEq (11/26/18 1550)    PRN Meds: albuterol, clonazepam  Physical Exam Pulmonary:     Effort: Pulmonary effort is normal.  Skin:    General: Skin is warm and dry.  Neurological:     Mental Status: He is alert.             Vital Signs: BP 101/65   Pulse  72   Temp 98.6 F (37 C) (Oral)   Resp (!) 26   Ht 5\' 6"  (1.676 m)   Wt 74.5 kg   SpO2 99%   BMI 26.51 kg/m  SpO2: SpO2: 99 % O2 Device: O2 Device: Nasal Cannula O2 Flow Rate: O2 Flow Rate (L/min): 2 L/min  Intake/output summary:   Intake/Output Summary (Last 24 hours) at 11/26/2018 1556 Last data filed at 11/26/2018 1303 Gross per 24 hour  Intake 311.4 ml  Output 2150 ml  Net -1838.6 ml   LBM: Last BM Date: 11/23/18 Baseline Weight: Weight: 80.7 kg Most recent weight: Weight: 74.5 kg       Palliative Assessment/Data:    Flowsheet Rows     Most Recent Value  Intake Tab  Unit at Time of Referral  Med/Surg Unit  Palliative Care Primary Diagnosis  Cancer  Date Notified  11/19/18  Palliative Care Type  New Palliative care  Reason for referral  Clarify Goals of Care  Date of Admission  11/18/18  Date first seen by Palliative Care  11/20/18  # of days Palliative referral response time  1 Day(s)  # of days IP prior to Palliative referral  1  Clinical Assessment  Psychosocial & Spiritual Assessment  Palliative Care Outcomes      Patient Active Problem List   Diagnosis Date Noted  . Dilated cardiomyopathy ([place]) 11/23/2018  . Encephalopathy 11/23/2018  . Bilateral lower extremity edema   . CHF exacerbation ([place])   . COPD exacerbation ([place])   . Acute CHF (congestive heart failure) ([place]) 11/18/2018  . Goals of care, counseling/discussion 06/19/2018  . Weight loss 12/06/2017  . Insomnia 11/16/2015  . Lung cancer, upper lobe ([place]) 04/11/2015  . 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    Code Status:    Code Status Orders  (From admission, onward)         Start     Ordered   11/22/18 1858  Full code  Continuous     11/22/18 1858        Code Status History    This patient has a current code status but no historical code status.   Advance Care Planning Activity       Prognosis:  Poor overall. < 6 months.    Discharge Planning:  Home with [place] was discussed with RN  Thank you for allowing the Palliative Medicine Team to assist in the care of this patient.   Total Time 2:40-3:55 75 min Prolonged Time Billed yes      Greater than 50%  of this time was spent counseling and coordinating care related to the above assessment and plan.  Asencion Gowda, NP  Please contact Palliative Medicine Team phone at [phone] for questions and concerns.

## 2018-11-26 NOTE — Progress Notes (Signed)
Called report to RN on telemetry unit.  Pt is currently resting.  All vitals are WDL.  Pt is complaining of no pain.

## 2018-11-26 NOTE — Progress Notes (Signed)
Attempted to transfer patient to [facility] per family request.  However [facility] is not honoring family request at this time.  They refused transfer.  Also partly they are full at their capacity at this time. Discussed at length with wife at bedside.  She is upset about not having nutrition for the last 3 days since patient has been n.p.o.  Family is still discussing about hospice versus full aggressive care.  They wanted to get a second opinion.  However [facility] and [facility] refused transfer at this time. We will get an NG tube for tube feeds.  Advised wife to think about gastrostomy tube placement if patient is a full code for discharge disposition. Wife requested a family meeting with the cardiologist and pulmonologist and the patient's children.

## 2018-11-26 NOTE — Progress Notes (Signed)
Went into room to attempt to place NGT and start tube feeds at this time. Pt persistently refused NGT. Pt and wife was also notified that [facility] rejected pt transer at this time per Dr. Michail Sermon.

## 2018-11-26 NOTE — Progress Notes (Signed)
Discussed with [facility] transfer center and also CHF specialist Dr. Legrand Como blazing at [facility] about Jason Bryan's case.  Went over the history and current treatment here.  Given patient's history of squamous cell carcinoma and radiation to chest and current active aspiration issues, they have clearly stated that patient would not be a candidate for any further stepup therapy including LVAD or transplant at this point.  They have refused transfer. Discussed with the cardiology and palliative care team.

## 2018-11-26 NOTE — Progress Notes (Signed)
[place] [facility] [place] NAME: Jason Bryan    MR#:  [id number]  DATE OF BIRTH:  October 12, 1955  SUBJECTIVE:  CHIEF COMPLAINT:   Chief Complaint  Patient presents with  . Leg Swelling  . Shortness of Breath   -Stable on 2 L oxygen, off milrinone drip.  Remains on 2 L oxygen.  Complains of weakness.  Breathing is improved  REVIEW OF SYSTEMS:  Review of Systems  Constitutional: Positive for malaise/fatigue. Negative for chills and fever.  HENT: Negative for ear discharge, hearing loss and nosebleeds.   Eyes: Negative for blurred vision and double vision.  Respiratory: Positive for shortness of breath. Negative for cough and wheezing.   Cardiovascular: Negative for chest pain, palpitations and leg swelling.  Gastrointestinal: Negative for abdominal pain, constipation, diarrhea, nausea and vomiting.  Genitourinary: Negative for dysuria.  Musculoskeletal: Positive for myalgias.  Neurological: Positive for weakness. Negative for dizziness, speech change, focal weakness, seizures and headaches.  Psychiatric/Behavioral: Negative for depression.    DRUG ALLERGIES:  No Known Allergies  VITALS:  Blood pressure 106/68, pulse 66, temperature 98.6 F (37 C), temperature source Oral, resp. rate (!) 25, height 5\' 6"  (1.676 m), weight 74.5 kg, SpO2 96 %.  PHYSICAL EXAMINATION:  Physical Exam   GENERAL:  63 y.o.-year-old patient lying in the bed with no acute distress. Ill appearing EYES: Pupils equal, round, reactive to light and accommodation. No scleral icterus. Extraocular muscles intact.  HEENT: Head atraumatic, normocephalic. Oropharynx and nasopharynx clear.  NECK:  Supple, no jugular venous distention. No thyroid enlargement, no tenderness.  LUNGS: Improved breath sounds today, no gurgling.  no wheezing, rales,rhonchi or crepitation. No use of accessory muscles of respiration.  CARDIOVASCULAR: S1, S2 normal. No  rubs, or gallops.  3/6 systolic  murmur is present ABDOMEN: Soft, nontender, nondistended. Bowel sounds present. No organomegaly or mass.  EXTREMITIES: No pedal edema, cyanosis, or clubbing.  NEUROLOGIC: Cranial nerves II through XII are intact. Muscle strength equal in all extremities. Sensation intact. Gait not checked.  Global weakness. PSYCHIATRIC: The patient is alert and oriented x 3.  SKIN: No obvious rash, lesion, or ulcer.    LABORATORY PANEL:   CBC Recent Labs  Lab 11/26/18 0439  WBC 11.7*  HGB 15.9  HCT 45.3  PLT 188   ------------------------------------------------------------------------------------------------------------------  Chemistries  Recent Labs  Lab 11/25/18 0939 11/26/18 0439  NA 139 140  K 3.8 3.3*  CL 93* 99  CO2 34* 32  GLUCOSE 132* 94  BUN 37* 39*  CREATININE 0.60* 0.55*  CALCIUM 8.5* 8.5*  MG 2.2  --    ------------------------------------------------------------------------------------------------------------------  Cardiac Enzymes No results for input(s): TROPONINI in the last 168 hours. ------------------------------------------------------------------------------------------------------------------  RADIOLOGY:  No results found.  EKG:   Orders placed or performed during the hospital encounter of 11/18/18  . EKG 12-Lead  . EKG 12-Lead  . EKG  . EKG 12-Lead  . EKG 12-Lead  . EKG 12-Lead  . EKG 12-Lead    ASSESSMENT AND PLAN:   63 year old male with past medical history significant for COPD, prior alcohol use, history of lung carcinoma and squamous cell carcinoma of the nasopharynx admitted to hospital secondary to acute congestive heart failure.  1.  Acute systolic heart failure with reduced ejection fraction-nonischemic cardiomyopathy -Appreciate cardiology consult. -Patient has been in ICU on milrinone drip since 11/19/18 to 11/25/18.  Diuresed well, net negative by almost 14 liters since admission -Patient had right heart cath and left heart  cath done  this admission showing nonobstructive CAD and EF <20% -History of alcohol abuse, which she quit about 6 years ago.  Could be nonischemic cardiomyopathy from alcohol use. -Based on blood pressure improvement, beta-blocker and enalapril started-given IV as patient n.p.o. -IV Lasix has been started   -Patient is weaned off the milrinone drip at this point.  2.  Acute renal failure-concern for cardiorenal syndrome. -Improved on milrinone infusion -Appreciate nephrology consult. -14 L net negative since admission  3.  Possible aspiration pneumonia-finished Unasyn -History of radiation for his nasopharyngeal cancer in the past. -  Failed swallow evaluation.  Remains n.p.o.   -Normal WBC count, on 2 L oxygen  4.  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  He had left upper lobe non-small cell lung cancer which has been treated with radiation in the past.  Most recent PET scan showing right upper lobe nodule for which he underwent radiation and finished in May 2020.  -Continue to follow-up with oncology as outpatient.  Currently not receiving any active chemotherapy   5.  DVT prophylaxis-on Lovenox  Appreciate palliative care input-overall poor prognosis.   Family has been requesting transfer to [facility].  Awaiting to hear back from [facility].  If they refuse to take the patient, will need to discuss about PEG tube placement for possible discharge planning.   All the records are reviewed and case discussed with Care Management/Social Workerr. Management plans discussed with the patient, family and they are in agreement.  CODE STATUS: Full code  TOTAL TIME TAKING CARE OF THIS PATIENT: 38 minutes.   POSSIBLE D/C IN ? DAYS, DEPENDING ON CLINICAL CONDITION.   Gladstone Lighter M.D on 11/26/2018 at 10:43 AM  Between 7am to 6pm - Pager - [phone]  After 6pm go to [email or web address] - password [email or web address] [place] [facility]   Office  [phone]  CC: Primary care physician; Cletis Athens, MD

## 2018-11-27 ENCOUNTER — Ambulatory Visit: Payer: Medicare Other | Admitting: Family

## 2018-11-27 DIAGNOSIS — R4702 Dysphasia: Secondary | ICD-10-CM

## 2018-11-27 LAB — BASIC METABOLIC PANEL
Anion gap: 9 (ref 5–15)
BUN: 35 mg/dL — ABNORMAL HIGH (ref 8–23)
CO2: 32 mmol/L (ref 22–32)
Calcium: 8.4 mg/dL — ABNORMAL LOW (ref 8.9–10.3)
Chloride: 98 mmol/L (ref 98–111)
Creatinine, Ser: 0.64 mg/dL (ref 0.61–1.24)
GFR calc Af Amer: >60 mL/min (ref >60)
GFR calc non Af Amer: >60 mL/min (ref >60)
Glucose, Bld: 87 mg/dL (ref 70–99)
Potassium: 3.6 mmol/L (ref 3.5–5.1)
Sodium: 139 mmol/L (ref 135–145)

## 2018-11-27 MED ORDER — FUROSEMIDE 40 MG PO TABS: 40.0000 mg | ORAL_TABLET | Freq: Every day | ORAL | 2 refills | Status: DC

## 2018-11-27 MED ORDER — LORAZEPAM 2 MG/ML IJ SOLN: 0.5000 mg | INTRAMUSCULAR | Status: DC | PRN

## 2018-11-27 MED ORDER — LISINOPRIL 5 MG PO TABS: 5.0000 mg | ORAL_TABLET | Freq: Every day | ORAL | 1 refills | Status: DC

## 2018-11-27 MED ORDER — IPRATROPIUM-ALBUTEROL 0.5-2.5 (3) MG/3ML IN SOLN: 3.0000 mL | Freq: Three times a day (TID) | RESPIRATORY_TRACT | 0 refills | Status: AC

## 2018-11-27 MED ORDER — LORAZEPAM 0.5 MG PO TABS: 0.5000 mg | ORAL_TABLET | Freq: Three times a day (TID) | ORAL | 0 refills | Status: AC | PRN

## 2018-11-27 MED ORDER — CARVEDILOL 3.125 MG PO TABS: 3.1250 mg | ORAL_TABLET | Freq: Two times a day (BID) | ORAL | 2 refills | Status: DC

## 2018-11-27 NOTE — Progress Notes (Signed)
Progress Note  Patient Name: Jason Bryan Date of Encounter: 11/27/2018  Primary Cardiologist: [facility], Dr. Rockey Situ  Subjective   Patient asking for ice chips " I do not have any problems with my swallowing" " I do not need a feeding tube" " I just want to go home" Notes indicating he walked with PT He denies any significant shortness of breath  Inpatient Medications    Scheduled Meds:  Chlorhexidine Gluconate Cloth  6 each Topical Daily   enalaprilat  0.625 mg Intravenous Q6H   enoxaparin (LOVENOX) injection  40 mg Subcutaneous QHS   feeding supplement (PRO-STAT SUGAR FREE 64)  30 mL Per Tube Daily   free water  80 mL Per Tube Q4H   furosemide  40 mg Intravenous Daily   ipratropium-albuterol  3 mL Nebulization TID   metoprolol tartrate  2.5 mg Intravenous Q8H   sodium chloride flush  3 mL Intravenous Q12H   Continuous Infusions:  feeding supplement (OSMOLITE 1.5 CAL)     PRN Meds: albuterol, clonazepam   Vital Signs    Vitals:   11/26/18 2310 11/27/18 0634 11/27/18 0744 11/27/18 0747  BP: 128/79 116/68 (!) 100/58   Pulse: 70 73 69   Resp: 20 15 19    Temp: 98.2 F (36.8 C) 98 F (36.7 C) 98.6 F (37 C)   TempSrc: Oral  Oral   SpO2: 100% 93% 97% 97%  Weight: 72.2 kg 73.6 kg    Height: 5\' 6"  (1.676 m)       Intake/Output Summary (Last 24 hours) at 11/27/2018 1235 Last data filed at 11/26/2018 2300 Gross per 24 hour  Intake 10 ml  Output 675 ml  Net -665 ml   Last 3 Weights 11/27/2018 11/26/2018 11/26/2018  Weight (lbs) 162 lb 4.8 oz 159 lb 3.2 oz 164 lb 3.9 oz  Weight (kg) 73.619 kg 72.213 kg 74.5 kg      Telemetry    Normal sinus rhythm- Personally Reviewed  ECG    No new tracings today - Personally Reviewed  Physical Exam   Constitutional: Alert no distress.  HENT:  Head: Grossly normal Eyes:  no discharge. No scleral icterus.  Neck: JVD 8-10 cm, no carotid bruits  Cardiovascular: Regular rate and rhythm, no murmurs  appreciated Pulmonary/Chest: Coarse breath sounds bilaterally Abdominal: Soft.  no distension.  no tenderness.  Musculoskeletal: Normal range of motion Neurological:  normal muscle tone. Coordination normal. No atrophy Skin: Skin warm and dry Psychiatric: normal affect, pleasant    Labs    High Sensitivity Troponin:   Recent Labs  Lab 11/18/18 1200 11/18/18 1741  TROPONINIHS 45* 42*      Cardiac EnzymesNo results for input(s): TROPONINI in the last 168 hours. No results for input(s): TROPIPOC in the last 168 hours.   Chemistry Recent Labs  Lab 11/25/18 0939 11/26/18 0439 11/27/18 0534  NA 139 140 139  K 3.8 3.3* 3.6  CL 93* 99 98  CO2 34* 32 32  GLUCOSE 132* 94 87  BUN 37* 39* 35*  CREATININE 0.60* 0.55* 0.64  CALCIUM 8.5* 8.5* 8.4*  GFRNONAA >60 >60 >60  GFRAA >60 >60 >60  ANIONGAP 12 9 9      Hematology Recent Labs  Lab 11/22/18 0706 11/26/18 0439  WBC 8.9 11.7*  RBC 5.29 5.62  HGB 15.0 15.9  HCT 44.7 45.3  MCV 84.5 80.6  MCH 28.4 28.3  MCHC 33.6 35.1  RDW 16.5* 16.3*  PLT 182 188    BNP Recent  Labs  Lab 11/26/18 0439  BNP 1,676.0*     DDimer No results for input(s): DDIMER in the last 168 hours.   Radiology    Dg Chest Port 1 View  Result Date: 11/26/2018 CLINICAL DATA:  Cough. History of left upper lobe lung cancer diagnosed 2017. Right lung cancer 2015. EXAM: PORTABLE CHEST 1 VIEW COMPARISON:  11/24/2018 FINDINGS: Lungs are adequately inflated demonstrate persistent irregular nodule opacity over the left upper lobe slightly more dense with linear extension to the subpleural region. Interval resolution of previously seen right pleural effusion. No focal lobar consolidation. Stable prominence of the hilar regions with mild prominence of the perihilar markings. Cardiomediastinal silhouette and remainder of the exam is unchanged. IMPRESSION: Suggestion of mild vascular congestion. Interval resolution right pleural effusion. Known irregular nodular  opacity over the left upper lobe slightly denser with minimal linear extension to the subpleural region. Stable mild bilateral hilar prominence. Electronically Signed   By: Marin Olp M.D.   On: 11/26/2018 12:58    Cardiac Studies   [facility] 11/18/2018  Coronary angiography:   Right heart pressures RA mean 20 RV 40/10 mean 22 PA 40/24 mean 30 Wedge pressure mean 16 Cardiac output 3.69 Cardiac index 1.93 Nonobstructive coronary disease Mildly elevated right heart pressures Severely reduced LV function Echocardiogram estimate less than 20%  Echo 11/18/2018  1. The left ventricle has severely reduced systolic function, with an ejection fraction of <20%. The cavity size was moderately dilated. Left ventricular diastolic Doppler parameters are indeterminate. Left ventricular diffuse hypokinesis.  2. The right ventricle has mildly reduced systolic function. The cavity was mildly enlarged. There is no increase in right ventricular wall thickness. Right ventricular systolic pressure is mildly elevated with an estimated pressure of 36.1 mmHg.  3. Left atrial size was mildly dilated.  4. Mitral valve regurgitation is moderate  Patient Profile     63 y.o. male with a h/o squamous cell carcinoma of the nasopharynx, lung carcinoma, prior smoking history (40 pk yr), prior alcohol abuse, and COPD admitted with acute systolic CHF.  Assessment & Plan     A/P: Dilated cardiomyopathy, nonischemic -long etoh hx, patient not forthcoming about whether he has been drinking recently  Cardiac catheterization, no obstructive disease, EF <20% Milrinone started post procedure for low output failure, hypotension Initially required pressors for blood pressure support, this has been weaned off Milrinone infusion has been off for 2 days -Once he is able to take oral medications would restart carvedilol 3.125 mg twice daily losartan 25 daily with oral Lasix 40 daily  Acute renal failure cardiorenal  syndrome Renal function improved  with aggressive Lasix and milrinone infusion  -Appears to be at his baseline Off milrinone with no worsening renal function today  Lung carcinoma Previously treated with radiation Contributing to shortness of breath and dysphasia  COPD Severe 40.00 pack-year smoking history Stopped 63 yo Will need oxygen, inhalers at home at the time of discharge  ETOH,  Unclear if he still drinks Cardiomyopathy concerning for alcohol.  Nonischemic  Confusion Poor status at baseline per the wife, Was very confused last week and after aspiration event Seems to be back to his baseline  Dysphagia Witnessed several aspiration events dating back to last weekend -He has failed  swallow evaluation Note indicating he needs NG tube but he has declined on multiple occasions -Wife seems to be pushing this Patient reports that " he will talk to his wife", he is declining -Because of aspiration events all his cardiac  medications have been held and he is taking Vasotec injection  Long discussion with hospitalist concerning management of his cardiac disease given the dysphasia, his denial of his dysphasia, and refusal for feeding tube/NG tube, conflict with his wife pushing the tube Total encounter time more than 35 minutes Greater than 50% was spent in counseling and coordination of care with the patient       Signed, Ida Rogue, MD  11/27/2018, 12:35 PM

## 2018-11-27 NOTE — Progress Notes (Signed)
OT Cancellation Note  Patient Details Name: Jason Bryan MRN: [id number] DOB: 05-Jul-1955   Cancelled Treatment:    Reason Eval/Treat Not Completed: Other (comment). Consult received, chart reviewed. Pt noted to request home with hospice today. Secure messaged MD. Per MD, hold OT evaluation at this time. Will continue to follow acutely, and re-attempt OT evaluation if/when appropriate based on plan of care.   Jeni Salles, MPH, MS, OTR/L ascom [phone] 11/27/18, 11:44 AM

## 2018-11-27 NOTE — Progress Notes (Signed)
[facility] [place] NAME: Jason Bryan    MR#:  [id number]  DATE OF BIRTH:  1955/09/21  SUBJECTIVE:  CHIEF COMPLAINT:   Chief Complaint  Patient presents with  . Leg Swelling  . Shortness of Breath   -Remains on 2 L oxygen.  Still has occasional coarse breath sounds. -Patient refused NG tube feeds yesterday.  This morning confirms that he does not want any NG tube or PEG tubes.  He is done with this and wants to go home with hospice services.  He knows the consequences that he can die.  But just wants to go home.  REVIEW OF SYSTEMS:  Review of Systems  Constitutional: Positive for malaise/fatigue. Negative for chills and fever.  HENT: Negative for ear discharge, hearing loss and nosebleeds.   Eyes: Negative for blurred vision and double vision.  Respiratory: Positive for shortness of breath. Negative for cough and wheezing.   Cardiovascular: Negative for chest pain, palpitations and leg swelling.  Gastrointestinal: Negative for abdominal pain, constipation, diarrhea, nausea and vomiting.  Genitourinary: Negative for dysuria.  Musculoskeletal: Positive for myalgias.  Neurological: Positive for weakness. Negative for dizziness, speech change, focal weakness, seizures and headaches.  Psychiatric/Behavioral: Negative for depression.    DRUG ALLERGIES:  No Known Allergies  VITALS:  Blood pressure (!) 100/58, pulse 69, temperature 98.6 F (37 C), temperature source Oral, resp. rate 19, height 5\' 6"  (1.676 m), weight 73.6 kg, SpO2 97 %.  PHYSICAL EXAMINATION:  Physical Exam   GENERAL:  63 y.o.-year-old patient lying in the bed with no acute distress. Ill appearing EYES: Pupils equal, round, reactive to light and accommodation. No scleral icterus. Extraocular muscles intact.  HEENT: Head atraumatic, normocephalic. Oropharynx and nasopharynx clear.  NECK:  Supple, no jugular venous distention. No thyroid enlargement, no tenderness.   LUNGS: coarse breath sounds today, no gurgling.  no wheezing, rales,rhonchi or crepitation. No use of accessory muscles of respiration.  CARDIOVASCULAR: S1, S2 normal. No  rubs, or gallops.  3/6 systolic murmur is present ABDOMEN: Soft, nontender, nondistended. Bowel sounds present. No organomegaly or mass.  EXTREMITIES: No pedal edema, cyanosis, or clubbing.  NEUROLOGIC: Cranial nerves II through XII are intact. Muscle strength equal in all extremities. Sensation intact. Gait not checked.  Global weakness. PSYCHIATRIC: The patient is alert and oriented x 3.  SKIN: No obvious rash, lesion, or ulcer.    LABORATORY PANEL:   CBC Recent Labs  Lab 11/26/18 0439  WBC 11.7*  HGB 15.9  HCT 45.3  PLT 188   ------------------------------------------------------------------------------------------------------------------  Chemistries  Recent Labs  Lab 11/25/18 0939  11/27/18 0534  NA 139   < > 139  K 3.8   < > 3.6  CL 93*   < > 98  CO2 34*   < > 32  GLUCOSE 132*   < > 87  BUN 37*   < > 35*  CREATININE 0.60*   < > 0.64  CALCIUM 8.5*   < > 8.4*  MG 2.2  --   --    < > = values in this interval not displayed.   ------------------------------------------------------------------------------------------------------------------  Cardiac Enzymes No results for input(s): TROPONINI in the last 168 hours. ------------------------------------------------------------------------------------------------------------------  RADIOLOGY:  Dg Chest Port 1 View  Result Date: 11/26/2018 CLINICAL DATA:  Cough. History of left upper lobe lung cancer diagnosed 2017. Right lung cancer 2015. EXAM: PORTABLE CHEST 1 VIEW COMPARISON:  11/24/2018 FINDINGS: Lungs are adequately inflated demonstrate persistent irregular  nodule opacity over the left upper lobe slightly more dense with linear extension to the subpleural region. Interval resolution of previously seen right pleural effusion. No focal lobar  consolidation. Stable prominence of the hilar regions with mild prominence of the perihilar markings. Cardiomediastinal silhouette and remainder of the exam is unchanged. IMPRESSION: Suggestion of mild vascular congestion. Interval resolution right pleural effusion. Known irregular nodular opacity over the left upper lobe slightly denser with minimal linear extension to the subpleural region. Stable mild bilateral hilar prominence. Electronically Signed   By: Marin Olp M.D.   On: 11/26/2018 12:58    EKG:   Orders placed or performed during the hospital encounter of 11/18/18  . EKG 12-Lead  . EKG 12-Lead  . EKG  . EKG 12-Lead  . EKG 12-Lead  . EKG 12-Lead  . EKG 12-Lead    ASSESSMENT AND PLAN:   63 year old male with past medical history significant for COPD, prior alcohol use, history of lung carcinoma and squamous cell carcinoma of the nasopharynx admitted to hospital secondary to acute congestive heart failure.  1.  Acute systolic heart failure with reduced ejection fraction-nonischemic cardiomyopathy -Appreciate cardiology consult. -Patient has been in ICU on milrinone drip since 11/19/18 to 11/25/18.  Diuresed well, net negative by almost 14 liters since admission -Patient had right heart cath and left heart cath done this admission showing nonobstructive CAD and EF <20% -History of alcohol abuse, which she quit about 6 years ago.  Could be nonischemic cardiomyopathy from alcohol use. -Based on blood pressure improvement, beta-blocker and enalapril started-given IV as patient n.p.o. -IV Lasix has been started  -these meds will be changed to oral when discharged home with hospice services -Patient is weaned off the milrinone drip at this point.  2.  Acute renal failure-concern for cardiorenal syndrome. -Improved on milrinone infusion -Appreciate nephrology consult. -14 L net negative since admission  3.  Possible aspiration pneumonia-finished Unasyn -History of radiation for his  nasopharyngeal cancer in the past. -  Failed swallow evaluation.  Remains n.p.o. now if he goes home, he can be on pleasure feeds -Normal WBC count, on 2 L oxygen  4.  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  He had left upper lobe non-small cell lung cancer which has been treated with radiation in the past.  Most recent PET scan showing right upper lobe nodule for which he underwent radiation and finished in May 2020.  -Continue to follow-up with oncology as outpatient.  Currently not receiving any active chemotherapy   5.  DVT prophylaxis-on Lovenox  Appreciate palliative care input-overall poor prognosis.   Family wanted transfer to [facility] and [facility].  Both facilities refused as there would be nothing else new that they could offer for this patient. -Patient refused NG tube and PEG tube and wants to go home today. -Wife updated over the phone.   All the records are reviewed and case discussed with Care Management/Social Workerr. Management plans discussed with the patient, family and they are in agreement.  CODE STATUS: Full code  TOTAL TIME TAKING CARE OF THIS PATIENT: 38 minutes.   POSSIBLE D/C IN ? DAYS, DEPENDING ON CLINICAL CONDITION.   Gladstone Lighter M.D on 11/27/2018 at 10:40 AM  Between 7am to 6pm - Pager - [phone]  After 6pm go to [email or web address] - password [email or web address] [facility]  Office  [phone]  CC: Primary care physician; Cletis Athens, MD

## 2018-11-27 NOTE — Progress Notes (Signed)
New referral for [facility] services at home received from [facility]. Plan is for discharge home today. Writer met in the room  with patient and his wife Penny,to initiate education regarding hospice services, philosophy and team approach to care,understanding voiced. DME needs discussed, patient will require oxygen and a nebulizer mac hine to be in place prior to discharge and alsonrequests a walker. DME ordered for delivery as soon as possible. Patient will require EMS transport.  EMS to be called when DME is in place. CSW Randall Hiss and Building control surveyor made aware. Patient remains full code at this time. Patient information faxed to referral.  Flo Shanks BSN, RN, [facility] Liaison [facility] hospice [phone]

## 2018-11-27 NOTE — Plan of Care (Signed)
Pt prepared to be transported home via EMS on hospice care.   Problem: Education: Goal: Knowledge of General Education information will improve Description: Including pain rating scale, medication(s)/side effects and non-pharmacologic comfort measures Outcome: Completed/Met   Problem: Health Behavior/Discharge Planning: Goal: Ability to manage health-related needs will improve Outcome: Completed/Met   Problem: Clinical Measurements: Goal: Ability to maintain clinical measurements within normal limits will improve Outcome: Completed/Met Goal: Will remain free from infection Outcome: Completed/Met Goal: Diagnostic test results will improve Outcome: Completed/Met Goal: Respiratory complications will improve Outcome: Completed/Met Goal: Cardiovascular complication will be avoided Outcome: Completed/Met   Problem: Activity: Goal: Risk for activity intolerance will decrease Outcome: Completed/Met   Problem: Nutrition: Goal: Adequate nutrition will be maintained Outcome: Completed/Met   Problem: Coping: Goal: Level of anxiety will decrease Outcome: Completed/Met   Problem: Elimination: Goal: Will not experience complications related to bowel motility Outcome: Completed/Met Goal: Will not experience complications related to urinary retention Outcome: Completed/Met   Problem: Pain Managment: Goal: General experience of comfort will improve Outcome: Completed/Met   Problem: Safety: Goal: Ability to remain free from injury will improve Outcome: Completed/Met   Problem: Skin Integrity: Goal: Risk for impaired skin integrity will decrease Outcome: Completed/Met   Problem: Education: Goal: Ability to demonstrate management of disease process will improve Outcome: Completed/Met Goal: Ability to verbalize understanding of medication therapies will improve Outcome: Completed/Met Goal: Individualized Educational Video(s) Outcome: Completed/Met   Problem: Activity: Goal:  Capacity to carry out activities will improve Outcome: Completed/Met   Problem: Cardiac: Goal: Ability to achieve and maintain adequate cardiopulmonary perfusion will improve Outcome: Completed/Met   Problem: SLP Dysphagia Goals Goal: Misc Dysphagia Goal Outcome: Completed/Met   Problem: Inadequate Intake (NI-2.1) Goal: Food and/or nutrient delivery Description: Individualized approach for food/nutrient provision. Outcome: Completed/Met   Problem: Acute Rehab PT Goals(only PT should resolve) Goal: Patient Will Transfer Sit To/From Stand Outcome: Completed/Met Goal: Pt Will Perform Standing Balance Or Pre-Gait Outcome: Completed/Met Goal: Pt Will Ambulate Outcome: Completed/Met Goal: Pt Will Go Up/Down Stairs Outcome: Completed/Met

## 2018-11-27 NOTE — Care Management Important Message (Signed)
Important Message  Patient Details  Name: Jason Bryan MRN: [id number] Date of Birth: 03-Jun-1955   Medicare Important Message Given:  Yes     Dannette Barbara 11/27/2018, 12:35 PM

## 2018-11-27 NOTE — TOC Transition Note (Addendum)
Transition of Care [facility]) - CM/SW Discharge Note   Patient Details  Name: Jason Bryan MRN: [id number] Date of Birth: [birth year]  Transition of Care [facility]) CM/SW Contact:  Ross Ludwig, LCSW Phone Number: 11/27/2018, 5:53 PM   Clinical Narrative:     CSW was informed that patient has decided to go home with hospice.  CSW spoke to attending physician to find out why she did not want to send home with home health.  Per physician patient wants to eat and drink a regular diet however he is a high risk for aspiration.  Patient refused a ng tube and peg tube.  Physician said that if patient is on hospice services he can have pleasure feeds. Patient was provided choice for hospice agencies, patient and his wife agreed to [facility]  CSW asked what equipment they needed, they requested a nebulizer, oxygen, and walker.  Patient will need EMS transportation back home.  Patient and wife did not express any other concerns or issues.  CSW made referral to Santiago Glad at [facility]   Final next level of care: Home w Hospice Care Barriers to Discharge: Barriers Resolved   Patient Goals and CMS Choice Patient states their goals for this hospitalization and ongoing recovery are:: To return back home with hospice CMS Medicare.gov Compare Post Acute Care list provided to:: Patient Choice offered to / list presented to : Patient  Discharge Placement                       Discharge Plan and Services                  DME Agency: Other - Comment([facility]) Date DME Agency Contacted: 11/27/18 Time DME Agency Contacted: 1300 Representative spoke with at DME Agency: [facility] Interventions     Readmission Risk Interventions No flowsheet data found.

## 2018-11-27 NOTE — Discharge Summary (Signed)
[place] [facility] [place] NAME: Jason Bryan    MR#:  [id number]  DATE OF BIRTH:  Aug 05, 1955  DATE OF ADMISSION:  11/18/2018   ADMITTING PHYSICIAN: Nicholes Mango, MD  DATE OF DISCHARGE:  11/27/18  PRIMARY CARE PHYSICIAN: Cletis Athens, MD   ADMISSION DIAGNOSIS:   COPD exacerbation ([place]) [R44.3] Acute systolic congestive heart failure (HCC) [I50.21] Bilateral lower extremity edema [R60.0]  DISCHARGE DIAGNOSIS:   Active Problems:   Acute CHF (congestive heart failure) (HCC)   Bilateral lower extremity edema   CHF exacerbation (HCC)   COPD exacerbation (HCC)   Dilated cardiomyopathy ([place])   Encephalopathy   SECONDARY DIAGNOSIS:   Past Medical History:  Diagnosis Date   Cancer ([place]) 2011   squemous stage 3, head and neck Ca with chemo + rad tx's.    Lung cancer, upper lobe ([place]) 04/11/2015   Squamous cell lung cancer ([place]) 2015   Right Lung CA with chemo + rad tx's.    HOSPITAL COURSE:   63 year old male with past medical history significant for COPD, prior alcohol use, history of lung carcinoma and squamous cell carcinoma of the nasopharynx admitted to hospital secondary to acute congestive heart failure.  1.  Acute systolic heart failure with reduced ejection fraction-nonischemic cardiomyopathy -Appreciate cardiology consult. -Patient has been in ICU on milrinone drip since 11/19/18 to 11/25/18.  Diuresed well, net negative by almost 14 liters since admission -Patient had right heart cath and left heart cath done this admission showing nonobstructive CAD and EF <20% -History of alcohol abuse, which she quit about 6 years ago.  it is nonischemic cardiomyopathy from alcohol use -Patient is weaned off the milrinone drip at this point. -Patient was changed to IV Lasix, received IV metoprolol and enalapril as he was completely n.p.o. given his aspiration issues.  Since he is going  home with hospice services, will be discharged on oral  Lasix, oral lisinopril and low dose coreg - He can f/u with CHF clinic at [facility] or [facility]  2.  Acute renal failure-concern for cardiorenal syndrome. -Improved on milrinone infusion -Appreciate nephrology consult. -15 L net negative since admission  3.  aspiration pneumonia-finished Unasyn -History of radiation for his nasopharyngeal cancer in the past. -  Failed swallow evaluation.    Modified barium swallow study showed significant aspiration and delayed cough reflex. -Patient remained n.p.o. in the hospital.  He refused NG tube feeds and also refused per cutaneous gastrostomy tube placement.  He clearly mentioned that his intention is to go home and want to eat whatever he wants.  He is agreeable with hospice services. -Initially wife and children had trouble accepting his decision.  NG tube was attempted however patient refused and he was alert and oriented at the time.  Finally wife understood, she was at bedside when patient mentioned to me and the cardiologist that he does not want any feeding tubes, would like to go home. -He can have pleasure feeds -Recommend head of the bed elevation to 30 degrees at least.  2 L oxygen  4.  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  He had left upper lobe non-small cell lung cancer which has been treated with radiation in the past.  Most recent PET scan showing right upper lobe nodule for which he underwent radiation and finished in May 2020.  -Continue to follow-up with oncology as outpatient as needed.  Currently not receiving any active chemotherapy  Appreciate palliative care input-overall poor prognosis.   Family wanted transfer to [facility] and [facility].  Both facilities refused as there would be nothing else new that they could offer for this patient. -Patient refused NG tube and PEG tube and wants to go home -agreeable for hospice services.  Wife at bedside.  Hospice has been set up.  Patient wants to  remain full code at this time.Marland Kitchen  DISCHARGE CONDITIONS:   Critical  CONSULTS OBTAINED:   Treatment Team:  Minna Merritts, MD  PCCM by Dr. Patsey Berthold Palliative care team- Asencion Gowda  DRUG ALLERGIES:   No Known Allergies DISCHARGE MEDICATIONS:   Allergies as of 11/27/2018   No Known Allergies     Medication List    STOP taking these medications   Cartia XT 120 MG 24 hr capsule Generic drug: diltiazem   temazepam 30 MG capsule Commonly known as: RESTORIL     TAKE these medications   albuterol 108 (90 Base) MCG/ACT inhaler Commonly known as: VENTOLIN HFA   budesonide-formoterol 160-4.5 MCG/ACT inhaler Commonly known as: SYMBICORT Inhale 1 puff into the lungs daily.   carvedilol 3.125 MG tablet Commonly known as: Coreg Take 1 tablet (3.125 mg total) by mouth 2 (two) times daily.   furosemide 40 MG tablet Commonly known as: Lasix Take 1 tablet (40 mg total) by mouth daily. What changed:   medication strength  how much to take   ipratropium-albuterol 0.5-2.5 (3) MG/3ML Soln Commonly known as: DUONEB Take 3 mLs by nebulization 3 (three) times daily.   lisinopril 5 MG tablet Commonly known as: ZESTRIL Take 1 tablet (5 mg total) by mouth daily. What changed:   medication strength  how much to take   LORazepam 0.5 MG tablet Commonly known as: Ativan Take 1 tablet (0.5 mg total) by mouth every 8 (eight) hours as needed for anxiety.   traZODone 100 MG tablet Commonly known as: DESYREL Take 100 mg by mouth at bedtime.        DISCHARGE INSTRUCTIONS:   1.  Patient will be followed by hospice 2.  Follow-up with PCP as needed  DIET:   Patient is high risk for aspiration.  However can have pleasure feeds because of his hospice status  ACTIVITY:   Activity as tolerated  OXYGEN:   Home Oxygen: Yes.    Oxygen Delivery: 2 liters/min via Patient connected to nasal cannula oxygen  DISCHARGE LOCATION:   home with hospice services  If you  experience worsening of your admission symptoms, develop shortness of breath, life threatening emergency, suicidal or homicidal thoughts you must seek medical attention immediately by calling 911 or calling your MD immediately  if symptoms less severe.  You Must read complete instructions/literature along with all the possible adverse reactions/side effects for all the Medicines you take and that have been prescribed to you. Take any new Medicines after you have completely understood and accpet all the possible adverse reactions/side effects.   Please note  You were cared for by a hospitalist during your hospital stay. If you have any questions about your discharge medications or the care you received while you were in the hospital after you are discharged, you can call the unit and asked to speak with the hospitalist on call if the hospitalist that took care of you is not available. Once you are discharged, your primary care physician will handle any further medical issues. Please note that NO REFILLS for any discharge medications will be authorized once you are discharged, as  it is imperative that you return to your primary care physician (or establish a relationship with a primary care physician if you do not have one) for your aftercare needs so that they can reassess your need for medications and monitor your lab values.    On the day of Discharge:  VITAL SIGNS:   Blood pressure (!) 100/58, pulse 69, temperature 98.6 F (37 C), temperature source Oral, resp. rate 19, height 5\' 6"  (1.676 m), weight 73.6 kg, SpO2 97 %.  PHYSICAL EXAMINATION:    GENERAL:  63 y.o.-year-old patient lying in the bed with no acute distress. Ill appearing EYES: Pupils equal, round, reactive to light and accommodation. No scleral icterus. Extraocular muscles intact.  HEENT: Head atraumatic, normocephalic. Oropharynx and nasopharynx clear.  NECK:  Supple, no jugular venous distention. No thyroid enlargement, no  tenderness.  LUNGS: coarse breath sounds today, no gurgling.  no wheezing, rales,rhonchi or crepitation. No use of accessory muscles of respiration.  CARDIOVASCULAR: S1, S2 normal. No  rubs, or gallops.  3/6 systolic murmur is present ABDOMEN: Soft, nontender, nondistended. Bowel sounds present. No organomegaly or mass.  EXTREMITIES: No pedal edema, cyanosis, or clubbing.  NEUROLOGIC: Cranial nerves II through XII are intact. Muscle strength equal in all extremities. Sensation intact. Gait not checked.  Global weakness. PSYCHIATRIC: The patient is alert and oriented x 3.  SKIN: No obvious rash, lesion, or ulcer.   DATA REVIEW:   CBC Recent Labs  Lab 11/26/18 0439  WBC 11.7*  HGB 15.9  HCT 45.3  PLT 188    Chemistries  Recent Labs  Lab 11/25/18 0939  11/27/18 0534  NA 139   < > 139  K 3.8   < > 3.6  CL 93*   < > 98  CO2 34*   < > 32  GLUCOSE 132*   < > 87  BUN 37*   < > 35*  CREATININE 0.60*   < > 0.64  CALCIUM 8.5*   < > 8.4*  MG 2.2  --   --    < > = values in this interval not displayed.     Microbiology Results  Results for orders placed or performed during the hospital encounter of 11/18/18  SARS Coronavirus 2 [facility] order, Performed in [facility] hospital lab) Nasopharyngeal Nasopharyngeal Swab     Status: None   Collection Time: 11/18/18 12:00 PM   Specimen: Nasopharyngeal Swab  Result Value Ref Range Status   SARS Coronavirus 2 NEGATIVE NEGATIVE Final    Comment: (NOTE) If result is NEGATIVE SARS-CoV-2 target nucleic acids are NOT DETECTED. The SARS-CoV-2 RNA is generally detectable in upper and lower  respiratory specimens during the acute phase of infection. The lowest  concentration of SARS-CoV-2 viral copies this assay can detect is 250  copies / mL. A negative result does not preclude SARS-CoV-2 infection  and should not be used as the sole basis for treatment or other  patient management decisions.  A negative result may occur with  improper  specimen collection / handling, submission of specimen other  than nasopharyngeal swab, presence of viral mutation(s) within the  areas targeted by this assay, and inadequate number of viral copies  (<250 copies / mL). A negative result must be combined with clinical  observations, patient history, and epidemiological information. If result is POSITIVE SARS-CoV-2 target nucleic acids are DETECTED. The SARS-CoV-2 RNA is generally detectable in upper and lower  respiratory specimens dur ing the acute phase of infection.  Positive  results are indicative of active infection with SARS-CoV-2.  Clinical  correlation with patient history and other diagnostic information is  necessary to determine patient infection status.  Positive results do  not rule out bacterial infection or co-infection with other viruses. If result is PRESUMPTIVE POSTIVE SARS-CoV-2 nucleic acids MAY BE PRESENT.   A presumptive positive result was obtained on the submitted specimen  and confirmed on repeat testing.  While 2019 novel coronavirus  (SARS-CoV-2) nucleic acids may be present in the submitted sample  additional confirmatory testing may be necessary for epidemiological  and / or clinical management purposes  to differentiate between  SARS-CoV-2 and other Sarbecovirus currently known to infect humans.  If clinically indicated additional testing with an alternate test  methodology [phone]) is advised. The SARS-CoV-2 RNA is generally  detectable in upper and lower respiratory sp ecimens during the acute  phase of infection. The expected result is Negative. Fact Sheet for Patients:  StrictlyIdeas.no Fact Sheet for Healthcare Providers: BankingDealers.co.za This test is not yet approved or cleared by the [place] FDA and has been authorized for detection and/or diagnosis of SARS-CoV-2 by FDA under an Emergency Use Authorization (EUA).  This EUA will remain in  effect (meaning this test can be used) for the duration of the COVID-19 declaration under Section 564(b)(1) of the Act, 21 U.S.C. section 360bbb-3(b)(1), unless the authorization is terminated or revoked sooner. Performed at [facility], [address] [place], [place] [address]   MRSA PCR Screening     Status: None   Collection Time: 11/19/18  8:06 PM   Specimen: Nasal Mucosa; Nasopharyngeal  Result Value Ref Range Status   MRSA by PCR NEGATIVE NEGATIVE Final    Comment:        The GeneXpert MRSA Assay (FDA approved for NASAL specimens only), is one component of a comprehensive MRSA colonization surveillance program. It is not intended to diagnose MRSA infection nor to guide or monitor treatment for MRSA infections. Performed at [facility], [address], [place], [place] [address]     RADIOLOGY:  No results found.   Management plans discussed with the patient, family and they are in agreement.  CODE STATUS:     Code Status Orders  (From admission, onward)         Start     Ordered   11/22/18 1858  Full code  Continuous     11/22/18 1858        Code Status History    This patient has a current code status but no historical code status.   Advance Care Planning Activity      TOTAL TIME TAKING CARE OF THIS PATIENT: 38 minutes.    Gladstone Lighter M.D on 11/27/2018 at 3:15 PM  Between 7am to 6pm - Pager - [phone]  After 6pm go to [email or web address] - Proofreader  [facility]  Office  [phone]  CC: Primary care physician; Cletis Athens, MD   Note: This dictation was prepared with Dragon dictation along with smaller phrase technology. Any transcriptional errors that result from this process are unintentional.

## 2018-12-28 DIAGNOSIS — I420 Dilated cardiomyopathy: Secondary | ICD-10-CM | POA: Diagnosis not present

## 2018-12-28 DIAGNOSIS — C3410 Malignant neoplasm of upper lobe, unspecified bronchus or lung: Secondary | ICD-10-CM | POA: Diagnosis not present

## 2018-12-28 DIAGNOSIS — I5023 Acute on chronic systolic (congestive) heart failure: Secondary | ICD-10-CM | POA: Diagnosis not present

## 2019-01-06 ENCOUNTER — Other Ambulatory Visit: Payer: Self-pay

## 2019-01-06 ENCOUNTER — Ambulatory Visit
Admission: RE | Admit: 2019-01-06 | Discharge: 2019-01-06 | Disposition: A | Discharge status: Home / Self Care | Payer: No Typology Code available for payment source | Source: Ambulatory Visit | Attending: Radiation Oncology | Admitting: Radiation Oncology

## 2019-01-06 DIAGNOSIS — C3411 Malignant neoplasm of upper lobe, right bronchus or lung: Secondary | ICD-10-CM | POA: Insufficient documentation

## 2019-01-06 MED ORDER — IOHEXOL 300 MG/ML  SOLN
75.0000 mL | Freq: Once | INTRAMUSCULAR | Status: AC | PRN
  Administered 2019-01-06: 75 mL via INTRAVENOUS

## 2019-01-13 ENCOUNTER — Other Ambulatory Visit: Payer: Self-pay

## 2019-01-14 ENCOUNTER — Ambulatory Visit
Admission: RE | Admit: 2019-01-14 | Discharge: 2019-01-14 | Disposition: A | Discharge status: Home / Self Care | Payer: No Typology Code available for payment source | Source: Ambulatory Visit | Attending: Radiation Oncology | Admitting: Radiation Oncology

## 2019-01-14 ENCOUNTER — Other Ambulatory Visit: Payer: Self-pay | Admitting: *Deleted

## 2019-01-14 ENCOUNTER — Encounter: Payer: Self-pay | Admitting: Radiation Oncology

## 2019-01-14 ENCOUNTER — Other Ambulatory Visit: Payer: Self-pay

## 2019-01-14 VITALS — BP 136/78 | HR 101 | Temp 99.1°F | Resp 18 | Wt 161.8 lb

## 2019-01-14 DIAGNOSIS — Z923 Personal history of irradiation: Secondary | ICD-10-CM | POA: Insufficient documentation

## 2019-01-14 DIAGNOSIS — Z87891 Personal history of nicotine dependence: Secondary | ICD-10-CM | POA: Diagnosis not present

## 2019-01-14 DIAGNOSIS — C3411 Malignant neoplasm of upper lobe, right bronchus or lung: Secondary | ICD-10-CM

## 2019-01-14 DIAGNOSIS — Z85818 Personal history of malignant neoplasm of other sites of lip, oral cavity, and pharynx: Secondary | ICD-10-CM | POA: Insufficient documentation

## 2019-01-14 NOTE — Progress Notes (Signed)
Radiation Oncology Follow up Note  Name: Jason Bryan   Date:   01/14/2019 MRN:  [id number] DOB: 10/11/1955    This 63 y.o. male presents to the clinic today for 46-month follow-up status post SBRT to his right upper lobe for stage I non-small cell lung cancer.  REFERRING PROVIDER: Cletis Athens, MD  HPI: Patient is a 63 year old male now about 5 months having completed SBRT to his right upper lobe for stage I non-small cell lung cancer.  He also received radiation therapy to his lungs approximately 3 years prior for non-small cell lung cancer of the left lung.  He also has a history of nasopharyngeal cancer treated back in two thousand and twelve.  He is seen today in follow-up and is doing well he is put on about 20 pounds since last follow-up 4 months prior.  He specifically [place] cough hemoptysis chest tightness.  From a head and neck standpoint he is having no dysphagia or head and neck pain.  He had a recent CT scan.  Showing decreased size and right upper lobe pulmonary nodule consistent with radiation changes.  Area of left upper lobe consolidation and distortion is slightly decreased and will be followed.  COMPLICATIONS OF TREATMENT: none  FOLLOW UP COMPLIANCE: keeps appointments   PHYSICAL EXAM:  BP 136/78 (BP Location: Left Arm, Patient Position: Sitting)   Pulse (!) 101   Temp 99.1 F (37.3 C) (Tympanic)   Resp 18   Wt 161 lb 12.8 oz (73.4 kg)   BMI 26.12 kg/m  Well-developed well-nourished patient in NAD. HEENT reveals PERLA, EOMI, discs not visualized.  Oral cavity is clear. No oral mucosal lesions are identified. Neck is clear without evidence of cervical or supraclavicular adenopathy. Lungs are clear to A&P. Cardiac examination is essentially unremarkable with regular rate and rhythm without murmur rub or thrill. Abdomen is benign with no organomegaly or masses noted. Motor sensory and DTR levels are equal and symmetric in the upper and lower extremities. Cranial nerves II  through XII are grossly intact. Proprioception is intact. No peripheral adenopathy or edema is identified. No motor or sensory levels are noted. Crude visual fields are within normal range.  RADIOLOGY RESULTS: CT scans reviewed compatible with above-stated findings  PLAN: Present time patient is doing well he has had a good response to SBRT treatment.  Chest is stable by CT criteria I am pleased with his overall progress I am pleased with his weight gain.  I have asked to see him back in 6 months with a repeat CT scan at that time.  Patient knows to call sooner with any concerns.  I would like to take this opportunity to thank you for allowing me to participate in the care of your patient.Noreene Filbert, MD

## 2019-02-05 DIAGNOSIS — R600 Localized edema: Secondary | ICD-10-CM | POA: Diagnosis not present

## 2019-02-05 DIAGNOSIS — F1911 Other psychoactive substance abuse, in remission: Secondary | ICD-10-CM | POA: Diagnosis not present

## 2019-02-05 DIAGNOSIS — I420 Dilated cardiomyopathy: Secondary | ICD-10-CM | POA: Diagnosis not present

## 2019-05-12 DIAGNOSIS — I420 Dilated cardiomyopathy: Secondary | ICD-10-CM | POA: Diagnosis not present

## 2019-05-12 DIAGNOSIS — R600 Localized edema: Secondary | ICD-10-CM | POA: Diagnosis not present

## 2019-05-12 DIAGNOSIS — F1911 Other psychoactive substance abuse, in remission: Secondary | ICD-10-CM | POA: Diagnosis not present

## 2019-07-14 ENCOUNTER — Ambulatory Visit: Payer: Medicare Other | Admitting: Radiation Oncology

## 2019-07-19 ENCOUNTER — Ambulatory Visit
Admission: RE | Admit: 2019-07-19 | Discharge: 2019-07-19 | Disposition: A | Discharge status: Home / Self Care | Payer: Medicare HMO | Source: Ambulatory Visit | Attending: Radiation Oncology | Admitting: Radiation Oncology

## 2019-07-19 ENCOUNTER — Other Ambulatory Visit: Payer: Self-pay | Admitting: *Deleted

## 2019-07-19 DIAGNOSIS — C3411 Malignant neoplasm of upper lobe, right bronchus or lung: Secondary | ICD-10-CM

## 2019-07-22 ENCOUNTER — Ambulatory Visit: Payer: Medicare HMO | Admitting: Radiation Oncology

## 2019-07-28 ENCOUNTER — Other Ambulatory Visit: Payer: Self-pay

## 2019-07-28 ENCOUNTER — Ambulatory Visit
Admission: RE | Admit: 2019-07-28 | Discharge: 2019-07-28 | Disposition: A | Discharge status: Home / Self Care | Payer: Medicare HMO | Source: Ambulatory Visit | Attending: Radiation Oncology | Admitting: Radiation Oncology

## 2019-07-28 DIAGNOSIS — C3411 Malignant neoplasm of upper lobe, right bronchus or lung: Secondary | ICD-10-CM | POA: Diagnosis not present

## 2019-07-28 DIAGNOSIS — R918 Other nonspecific abnormal finding of lung field: Secondary | ICD-10-CM | POA: Diagnosis not present

## 2019-07-28 LAB — POCT I-STAT CREATININE: Creatinine, Ser: 1 mg/dL (ref 0.61–1.24)

## 2019-07-28 MED ORDER — IOHEXOL 300 MG/ML  SOLN
75.0000 mL | Freq: Once | INTRAMUSCULAR | Status: AC | PRN
  Administered 2019-07-28: 75 mL via INTRAVENOUS

## 2019-08-02 ENCOUNTER — Other Ambulatory Visit: Payer: Self-pay | Admitting: Internal Medicine

## 2019-08-09 ENCOUNTER — Ambulatory Visit: Payer: Medicare HMO | Admitting: Radiation Oncology

## 2019-08-12 ENCOUNTER — Ambulatory Visit: Payer: Medicare HMO | Admitting: Radiation Oncology

## 2019-09-01 ENCOUNTER — Ambulatory Visit
Admission: RE | Admit: 2019-09-01 | Discharge: 2019-09-01 | Disposition: A | Discharge status: Home / Self Care | Payer: Medicare HMO | Source: Ambulatory Visit | Attending: Radiation Oncology | Admitting: Radiation Oncology

## 2019-09-01 ENCOUNTER — Other Ambulatory Visit: Payer: Self-pay

## 2019-09-01 ENCOUNTER — Inpatient Hospital Stay: Payer: Medicare HMO | Attending: Radiation Oncology

## 2019-09-01 ENCOUNTER — Other Ambulatory Visit: Payer: Self-pay | Admitting: *Deleted

## 2019-09-01 DIAGNOSIS — C3411 Malignant neoplasm of upper lobe, right bronchus or lung: Secondary | ICD-10-CM | POA: Insufficient documentation

## 2019-09-01 NOTE — Progress Notes (Signed)
Radiation Oncology Follow up Note  Name: Jason Bryan   Date:   09/01/2019 MRN:  [id number] DOB: 10/28/1955    This 64 y.o. male presents to the clinic today for 1 year follow-up status post SBRT to his right upper lobe for stage I non-small cell lung cancer.  REFERRING PROVIDER: Cletis Athens, MD  HPI: Patient is a 64 year old male now at 1 year status post SBRT to his right upper lobe for stage I non-small cell lung cancer.  He also received radiation therapy approximately 4 years prior for non-small cell lung cancer of the left lung.  He also has a history of nasopharyngeal cancer treated back in 2012.  He is seen today and doing well he specifically denies cough hemoptysis chest tightness or any head and neck complaints.  He had a CT scan back in May which showed.  Decrease in size of right upper lobe pulmonary nodule secondary to radiation changes.  The area of left upper lobe consolidation architectural distortion slightly decreased.  This will also be followed.  Is previous borderline right paratracheal adenopathy has resolved.  COMPLICATIONS OF TREATMENT: none  FOLLOW UP COMPLIANCE: keeps appointments   PHYSICAL EXAM:  BP (P) 116/67 (BP Location: Left Arm, Patient Position: Sitting)    Pulse (!) (P) 117    Temp (P) 97.7 F (36.5 C) (Tympanic)    Resp (P) 20    Wt (P) 158 lb 3.2 oz (71.8 kg)    BMI (P) 25.53 kg/m  Well-developed well-nourished patient in NAD. HEENT reveals PERLA, EOMI, discs not visualized.  Oral cavity is clear. No oral mucosal lesions are identified. Neck is clear without evidence of cervical or supraclavicular adenopathy. Lungs are clear to A&P. Cardiac examination is essentially unremarkable with regular rate and rhythm without murmur rub or thrill. Abdomen is benign with no organomegaly or masses noted. Motor sensory and DTR levels are equal and symmetric in the upper and lower extremities. Cranial nerves II through XII are grossly intact. Proprioception is intact.  No peripheral adenopathy or edema is identified. No motor or sensory levels are noted. Crude visual fields are within normal range.  RADIOLOGY RESULTS: CT scan reviewed compatible with above-stated findings  PLAN: Present time patient is doing well chest under good control with by CT criteria and pleased with his overall progress.  I have asked to see him back in 1 year for follow-up with a CT scan at that time.  Patient knows to call with any concerns.  I would like to take this opportunity to thank you for allowing me to participate in the care of your patient.Noreene Filbert, MD

## 2019-09-06 ENCOUNTER — Other Ambulatory Visit: Payer: Self-pay | Admitting: Internal Medicine

## 2019-09-28 ENCOUNTER — Other Ambulatory Visit: Payer: Self-pay | Admitting: Internal Medicine

## 2019-10-21 ENCOUNTER — Other Ambulatory Visit: Payer: Self-pay | Admitting: Internal Medicine

## 2019-12-08 ENCOUNTER — Other Ambulatory Visit: Payer: Self-pay | Admitting: Internal Medicine

## 2020-08-25 ENCOUNTER — Ambulatory Visit: Admission: RE | Admit: 2020-08-25 | Payer: Medicaid Other | Source: Ambulatory Visit

## 2020-08-25 ENCOUNTER — Telehealth: Payer: Self-pay | Admitting: Radiation Oncology

## 2020-08-25 ENCOUNTER — Inpatient Hospital Stay: Payer: Medicaid Other | Attending: Radiation Oncology

## 2020-08-25 NOTE — Telephone Encounter (Signed)
Transportation tried to call patient and confirm his ride and the number is disconnected.  Routing to clinical team as the ride was provided to get the patient to his CT scan.

## 2020-08-31 ENCOUNTER — Ambulatory Visit: Payer: Medicare HMO | Admitting: Radiation Oncology

## 2020-09-04 ENCOUNTER — Ambulatory Visit: Payer: Medicaid Other | Admitting: Radiation Oncology
